# Patient Record
Sex: Female | Born: 1935 | Race: White | Hispanic: No | Marital: Married | Smoking: Former smoker
Health system: Southern US, Community
[De-identification: ages and names within clinical notes are randomized; demographics above are authoritative.]

## PROBLEM LIST (undated history)

## (undated) DIAGNOSIS — R0602 Shortness of breath: Secondary | ICD-10-CM

## (undated) DIAGNOSIS — T4145XA Adverse effect of unspecified anesthetic, initial encounter: Secondary | ICD-10-CM

## (undated) DIAGNOSIS — R112 Nausea with vomiting, unspecified: Secondary | ICD-10-CM

## (undated) DIAGNOSIS — Z9889 Other specified postprocedural states: Secondary | ICD-10-CM

## (undated) DIAGNOSIS — L989 Disorder of the skin and subcutaneous tissue, unspecified: Secondary | ICD-10-CM

## (undated) DIAGNOSIS — G479 Sleep disorder, unspecified: Secondary | ICD-10-CM

## (undated) DIAGNOSIS — E785 Hyperlipidemia, unspecified: Secondary | ICD-10-CM

## (undated) DIAGNOSIS — K219 Gastro-esophageal reflux disease without esophagitis: Secondary | ICD-10-CM

## (undated) DIAGNOSIS — M1990 Unspecified osteoarthritis, unspecified site: Secondary | ICD-10-CM

## (undated) DIAGNOSIS — I10 Essential (primary) hypertension: Secondary | ICD-10-CM

## (undated) DIAGNOSIS — T8859XA Other complications of anesthesia, initial encounter: Secondary | ICD-10-CM

## (undated) DIAGNOSIS — E079 Disorder of thyroid, unspecified: Secondary | ICD-10-CM

## (undated) DIAGNOSIS — Z8619 Personal history of other infectious and parasitic diseases: Secondary | ICD-10-CM

## (undated) DIAGNOSIS — M8580 Other specified disorders of bone density and structure, unspecified site: Secondary | ICD-10-CM

## (undated) DIAGNOSIS — K5790 Diverticulosis of intestine, part unspecified, without perforation or abscess without bleeding: Secondary | ICD-10-CM

## (undated) HISTORY — PX: ABDOMINAL HYSTERECTOMY: SHX81

## (undated) HISTORY — PX: BACK SURGERY: SHX140

## (undated) HISTORY — PX: TOTAL HIP ARTHROPLASTY: SHX124

## (undated) HISTORY — DX: Unspecified osteoarthritis, unspecified site: M19.90

## (undated) HISTORY — PX: CATARACT EXTRACTION: SUR2

## (undated) HISTORY — DX: Essential (primary) hypertension: I10

## (undated) HISTORY — PX: JOINT REPLACEMENT: SHX530

## (undated) HISTORY — PX: TONSILLECTOMY: SUR1361

## (undated) HISTORY — PX: LUMBAR LAMINECTOMY: SHX95

## (undated) HISTORY — DX: Hyperlipidemia, unspecified: E78.5

## (undated) HISTORY — DX: Disorder of thyroid, unspecified: E07.9

---

## 1981-10-15 HISTORY — PX: COLON SURGERY: SHX602

## 2004-03-30 ENCOUNTER — Encounter: Admission: RE | Admit: 2004-03-30 | Discharge: 2004-03-30 | Payer: Self-pay | Admitting: Family Medicine

## 2004-11-06 ENCOUNTER — Encounter: Admission: RE | Admit: 2004-11-06 | Discharge: 2004-11-06 | Payer: Self-pay | Admitting: Orthopedic Surgery

## 2005-03-19 ENCOUNTER — Encounter: Admission: RE | Admit: 2005-03-19 | Discharge: 2005-03-19 | Payer: Self-pay | Admitting: Orthopedic Surgery

## 2005-05-26 ENCOUNTER — Emergency Department (HOSPITAL_COMMUNITY): Admission: EM | Admit: 2005-05-26 | Discharge: 2005-05-26 | Payer: Self-pay | Admitting: Emergency Medicine

## 2005-05-31 ENCOUNTER — Encounter: Admission: RE | Admit: 2005-05-31 | Discharge: 2005-05-31 | Payer: Self-pay | Admitting: Family Medicine

## 2006-06-11 ENCOUNTER — Encounter: Admission: RE | Admit: 2006-06-11 | Discharge: 2006-06-11 | Payer: Self-pay | Admitting: Family Medicine

## 2006-09-24 ENCOUNTER — Inpatient Hospital Stay (HOSPITAL_COMMUNITY): Admission: RE | Admit: 2006-09-24 | Discharge: 2006-09-27 | Payer: Self-pay | Admitting: Orthopedic Surgery

## 2007-07-01 ENCOUNTER — Encounter: Admission: RE | Admit: 2007-07-01 | Discharge: 2007-07-01 | Payer: Self-pay | Admitting: Family Medicine

## 2008-01-19 ENCOUNTER — Ambulatory Visit: Payer: Self-pay | Admitting: Gastroenterology

## 2008-02-02 ENCOUNTER — Ambulatory Visit: Payer: Self-pay | Admitting: Gastroenterology

## 2008-08-10 ENCOUNTER — Encounter: Admission: RE | Admit: 2008-08-10 | Discharge: 2008-08-10 | Payer: Self-pay | Admitting: Family Medicine

## 2008-08-10 ENCOUNTER — Encounter: Payer: Self-pay | Admitting: Family Medicine

## 2009-02-02 ENCOUNTER — Encounter: Payer: Self-pay | Admitting: Family Medicine

## 2009-03-04 DIAGNOSIS — E785 Hyperlipidemia, unspecified: Secondary | ICD-10-CM | POA: Insufficient documentation

## 2009-03-04 DIAGNOSIS — I10 Essential (primary) hypertension: Secondary | ICD-10-CM | POA: Insufficient documentation

## 2009-03-10 ENCOUNTER — Ambulatory Visit: Payer: Self-pay | Admitting: Family Medicine

## 2009-03-10 DIAGNOSIS — M1990 Unspecified osteoarthritis, unspecified site: Secondary | ICD-10-CM | POA: Insufficient documentation

## 2009-03-10 DIAGNOSIS — E039 Hypothyroidism, unspecified: Secondary | ICD-10-CM | POA: Insufficient documentation

## 2009-03-11 LAB — CONVERTED CEMR LAB
CO2: 29 meq/L (ref 19–32)
Calcium: 9 mg/dL (ref 8.4–10.5)
Chloride: 102 meq/L (ref 96–112)
Creatinine, Ser: 0.6 mg/dL (ref 0.4–1.2)
Glucose, Bld: 87 mg/dL (ref 70–99)
Sodium: 135 meq/L (ref 135–145)

## 2009-06-01 ENCOUNTER — Ambulatory Visit: Payer: Self-pay | Admitting: Family Medicine

## 2009-09-12 ENCOUNTER — Ambulatory Visit: Payer: Self-pay | Admitting: Family Medicine

## 2009-09-12 DIAGNOSIS — L309 Dermatitis, unspecified: Secondary | ICD-10-CM | POA: Insufficient documentation

## 2009-09-12 DIAGNOSIS — L259 Unspecified contact dermatitis, unspecified cause: Secondary | ICD-10-CM

## 2009-11-08 ENCOUNTER — Encounter: Admission: RE | Admit: 2009-11-08 | Discharge: 2009-11-08 | Payer: Self-pay | Admitting: Family Medicine

## 2010-01-20 ENCOUNTER — Telehealth: Payer: Self-pay | Admitting: Family Medicine

## 2010-01-20 ENCOUNTER — Emergency Department (HOSPITAL_COMMUNITY): Admission: EM | Admit: 2010-01-20 | Discharge: 2010-01-21 | Payer: Self-pay | Admitting: Emergency Medicine

## 2010-01-20 ENCOUNTER — Emergency Department (HOSPITAL_COMMUNITY): Admission: EM | Admit: 2010-01-20 | Discharge: 2010-01-20 | Payer: Self-pay | Admitting: Family Medicine

## 2010-01-22 ENCOUNTER — Emergency Department (HOSPITAL_COMMUNITY): Admission: EM | Admit: 2010-01-22 | Discharge: 2010-01-23 | Payer: Self-pay | Admitting: Emergency Medicine

## 2010-01-22 ENCOUNTER — Emergency Department (HOSPITAL_COMMUNITY): Admission: EM | Admit: 2010-01-22 | Discharge: 2010-01-22 | Payer: Self-pay | Admitting: Family Medicine

## 2010-01-23 ENCOUNTER — Telehealth: Payer: Self-pay | Admitting: Family Medicine

## 2010-01-23 ENCOUNTER — Ambulatory Visit: Payer: Self-pay | Admitting: Family Medicine

## 2010-01-23 DIAGNOSIS — B029 Zoster without complications: Secondary | ICD-10-CM | POA: Insufficient documentation

## 2010-01-25 ENCOUNTER — Telehealth: Payer: Self-pay | Admitting: Family Medicine

## 2010-01-26 ENCOUNTER — Telehealth: Payer: Self-pay | Admitting: Family Medicine

## 2010-01-27 ENCOUNTER — Telehealth: Payer: Self-pay | Admitting: Family Medicine

## 2010-01-30 ENCOUNTER — Telehealth: Payer: Self-pay | Admitting: Family Medicine

## 2010-02-01 ENCOUNTER — Telehealth: Payer: Self-pay | Admitting: Family Medicine

## 2010-02-06 ENCOUNTER — Telehealth: Payer: Self-pay | Admitting: Family Medicine

## 2010-02-06 ENCOUNTER — Ambulatory Visit: Payer: Self-pay | Admitting: Family Medicine

## 2010-02-09 ENCOUNTER — Telehealth: Payer: Self-pay | Admitting: Family Medicine

## 2010-02-13 ENCOUNTER — Telehealth: Payer: Self-pay | Admitting: Family Medicine

## 2010-02-15 ENCOUNTER — Telehealth: Payer: Self-pay | Admitting: Family Medicine

## 2010-02-17 ENCOUNTER — Ambulatory Visit: Payer: Self-pay | Admitting: Family Medicine

## 2010-02-17 DIAGNOSIS — B0229 Other postherpetic nervous system involvement: Secondary | ICD-10-CM | POA: Insufficient documentation

## 2010-02-17 HISTORY — DX: Other postherpetic nervous system involvement: B02.29

## 2010-02-24 ENCOUNTER — Ambulatory Visit: Payer: Self-pay | Admitting: Family Medicine

## 2010-02-27 ENCOUNTER — Telehealth: Payer: Self-pay | Admitting: Family Medicine

## 2010-03-02 ENCOUNTER — Telehealth: Payer: Self-pay | Admitting: Family Medicine

## 2010-03-06 ENCOUNTER — Ambulatory Visit: Payer: Self-pay | Admitting: Family Medicine

## 2010-03-07 LAB — CONVERTED CEMR LAB
CO2: 32 meq/L (ref 19–32)
Calcium: 9 mg/dL (ref 8.4–10.5)
Chloride: 96 meq/L (ref 96–112)
Glucose, Bld: 95 mg/dL (ref 70–99)
Sodium: 135 meq/L (ref 135–145)

## 2010-03-08 ENCOUNTER — Telehealth: Payer: Self-pay | Admitting: Family Medicine

## 2010-03-14 ENCOUNTER — Telehealth: Payer: Self-pay | Admitting: Family Medicine

## 2010-03-21 ENCOUNTER — Telehealth: Payer: Self-pay | Admitting: Family Medicine

## 2010-03-22 ENCOUNTER — Telehealth: Payer: Self-pay | Admitting: Family Medicine

## 2010-03-23 ENCOUNTER — Telehealth: Payer: Self-pay | Admitting: Family Medicine

## 2010-03-27 ENCOUNTER — Telehealth: Payer: Self-pay | Admitting: Family Medicine

## 2010-03-28 ENCOUNTER — Telehealth: Payer: Self-pay | Admitting: Family Medicine

## 2010-03-29 ENCOUNTER — Ambulatory Visit: Payer: Self-pay | Admitting: Family Medicine

## 2010-04-10 ENCOUNTER — Ambulatory Visit: Payer: Self-pay | Admitting: Family Medicine

## 2010-04-18 ENCOUNTER — Telehealth: Payer: Self-pay | Admitting: Family Medicine

## 2010-04-28 ENCOUNTER — Telehealth: Payer: Self-pay | Admitting: Family Medicine

## 2010-05-03 ENCOUNTER — Telehealth: Payer: Self-pay | Admitting: Family Medicine

## 2010-05-04 ENCOUNTER — Ambulatory Visit: Payer: Self-pay | Admitting: Family Medicine

## 2010-05-04 DIAGNOSIS — R5383 Other fatigue: Secondary | ICD-10-CM

## 2010-05-04 DIAGNOSIS — F82 Specific developmental disorder of motor function: Secondary | ICD-10-CM | POA: Insufficient documentation

## 2010-05-04 DIAGNOSIS — R5381 Other malaise: Secondary | ICD-10-CM

## 2010-05-06 ENCOUNTER — Encounter: Admission: RE | Admit: 2010-05-06 | Discharge: 2010-05-06 | Payer: Self-pay | Admitting: Family Medicine

## 2010-05-06 ENCOUNTER — Encounter: Payer: Self-pay | Admitting: Family Medicine

## 2010-05-08 ENCOUNTER — Ambulatory Visit: Payer: Self-pay | Admitting: Family Medicine

## 2010-05-22 ENCOUNTER — Ambulatory Visit: Payer: Self-pay | Admitting: Family Medicine

## 2010-05-25 ENCOUNTER — Encounter: Payer: Self-pay | Admitting: Family Medicine

## 2010-06-13 ENCOUNTER — Encounter: Payer: Self-pay | Admitting: Family Medicine

## 2010-06-28 ENCOUNTER — Ambulatory Visit: Payer: Self-pay | Admitting: Family Medicine

## 2010-09-26 ENCOUNTER — Ambulatory Visit: Payer: Self-pay | Admitting: Family Medicine

## 2010-09-26 DIAGNOSIS — J370 Chronic laryngitis: Secondary | ICD-10-CM

## 2010-10-23 ENCOUNTER — Ambulatory Visit
Admission: RE | Admit: 2010-10-23 | Discharge: 2010-10-23 | Payer: Self-pay | Source: Home / Self Care | Attending: Family Medicine | Admitting: Family Medicine

## 2010-10-26 ENCOUNTER — Encounter: Payer: Self-pay | Admitting: Family Medicine

## 2010-11-14 NOTE — Assessment & Plan Note (Signed)
Summary: 1 month fup//ccm   Vital Signs:  Patient profile:   77 year old female Weight:      169 pounds Temp:     98.0 degrees F oral BP sitting:   130 / 80  (left arm) Cuff size:   regular  Vitals Entered By: Kathrynn Speed CMA (May 08, 2010 10:15 AM) CC: One month fu, MRI scan done of head,src   History of Present Illness: Followup postherpetic neuralgia and some recent issues of fatigue and difficulty finding appropriate words. We had concern about possibility of stroke vs medication side effects. 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  She has tapered back her Nortriptyline to 50 mg at night and is trying to scale back tramadol and has reduced her gabapentin to 300 mg t.i.d. Pain is stable at 3-4/10.  Current Medications (verified): 1)  Synthroid 112 Mcg Tabs (Levothyroxine Sodium) .... Once Daily 2)  Hydrochlorothiazide 25 Mg Tabs (Hydrochlorothiazide) .... One Tab Once Daily 3)  Zyrtec Allergy 10 Mg Tabs (Cetirizine Hcl) .... As Needed 4)  Timoptic Ocudose 0.5 % Soln (Timolol Maleate) .... One Drop To Each Eye Daily 5)  Aclovate 0.05 % Crea (Alclometasone Dipropionate) .... As Directed 6)  Caltrate 600+d 600-400 Mg-Unit Tabs (Calcium Carbonate-Vitamin D) .... Once Daily 7)  Ocuvite Preservision  Tabs (Multiple Vitamins-Minerals) .... Two Times A Day 8)  Co Q-10 Vitamin E Fish Oil 60-90-25-200 Caps (Dha-Epa-Coenzyme Q10-Vitamin E) .... Once Daily 9)  Glucosamine-Chondroitin 1500-1200 Mg/7ml Liqd (Glucosamine-Chondroitin) .... Three Times A Day 10)  Claritin 10 Mg Tabs (Loratadine) .... As Needed 11)  Clobetasol Propionate 0.05 % Oint (Clobetasol Propionate) .... Apply As Directed Once Daily As Needed. 12)  Amlodipine Besylate 2.5 Mg Tabs (Amlodipine Besylate) .... One By Mouth At Bedtime 13)  Gabapentin 300 Mg Caps (Gabapentin) .Marland Kitchen.. 1 Tab One To  Three  Times A Day To 3 14)  Nortriptyline Hcl 50 Mg Caps (Nortriptyline Hcl) .... Two By Mouth At Bedtime 15)  Tramadol Hcl 50 Mg Tabs (Tramadol Hcl) .Marland Kitchen.. 1-2 By Mouth Q 6 Hours As Needed Pain 16)  Zostrix Arthritis Pain Relief 0.025 % Crea (Capsaicin) .... Apply To Affected Area Twice Daily As Needed 17)  Pepcid Ac Maximum Strength 20 Mg Tabs (Famotidine) .... As Needed  Allergies (verified): 1)  ! Advil (Ibuprofen) 2)  ! Penicillin V Potassium (Penicillin V Potassium) 3)  ! Codeine Sulfate (Codeine Sulfate) 4)  ! Cephalosporins  Past History:  Past Medical History: Last updated: 03/10/2009 Hypothyroidism Hyperlipidemia Hypertension Osteoarthritis  Physical Exam  General:  Well-developed,well-nourished,in no acute distress; alert,appropriate and cooperative throughout examination Mouth:  Oral mucosa and oropharynx without lesions or exudates.  Teeth in good repair. Neck:  No deformities, masses, or tenderness noted. Lungs:  Normal respiratory effort, chest expands symmetrically. Lungs are clear to auscultation, no crackles or wheezes. Heart:  Normal rate and regular rhythm. S1 and S2 normal without gallop, murmur, click, rub or other extra sounds.   Impression & Recommendations:  Problem # 1:  POSTHERPETIC NEURALGIA (ICD-053.19)  stable with tapering of medication. Suspect a lot of her symptoms recent related to overmedication but she required significant medication to control her symptoms. Will continue in one week tapering of the nortriptyline to 25 mg at night.  Orders: Prescription Created Electronically [id number])  Complete Medication List: 1)  Synthroid 112 Mcg Tabs (Levothyroxine sodium) .... Once daily 2)  Hydrochlorothiazide 25 Mg Tabs (Hydrochlorothiazide) .... One tab once daily 3)  Zyrtec Allergy 10 Mg Tabs (Cetirizine hcl) .... As needed 4)  Timoptic Ocudose 0.5 % Soln (Timolol maleate) .... One drop to each eye daily 5)  Aclovate 0.05 % Crea (Alclometasone  dipropionate) .... As directed 6)  Caltrate 600+d 600-400 Mg-unit Tabs (Calcium carbonate-vitamin d) .... Once daily 7)  Ocuvite Preservision Tabs (Multiple vitamins-minerals) .... Two times a day 8)  Co Q-10 Vitamin E Fish Oil 60-90-25-200 Caps (Dha-epa-coenzyme q10-vitamin e) .... Once daily 9)  Glucosamine-chondroitin 1500-1200 Mg/51ml Liqd (Glucosamine-chondroitin) .... Three times a day 10)  Claritin 10 Mg Tabs (Loratadine) .... As needed 11)  Clobetasol Propionate 0.05 % Oint (Clobetasol propionate) .... Apply as directed once daily as needed. 12)  Amlodipine Besylate 2.5 Mg Tabs (Amlodipine besylate) .... One by mouth at bedtime 13)  Gabapentin 300 Mg Caps (Gabapentin) .Marland Kitchen.. 1 tab one to  three times a day to 3 14)  Nortriptyline Hcl 25 Mg Caps (Nortriptyline hcl) .... One by mouth at bedtime 15)  Tramadol Hcl 50 Mg Tabs (Tramadol hcl) .Marland Kitchen.. 1-2 by mouth q 6 hours as needed pain 16)  Zostrix Arthritis Pain Relief 0.025 % Crea (Capsaicin) .... Apply to affected area twice daily as needed 17)  Pepcid Ac Maximum Strength 20 Mg Tabs (Famotidine) .... As needed  Patient Instructions: 1)  In one week try further tapering of nortriptyline to 25 mg at night 2)  Please schedule a follow-up appointment in 2 weeks.  Prescriptions: NORTRIPTYLINE HCL 25 MG CAPS (NORTRIPTYLINE HCL) one by mouth at bedtime  #30 x 1   Entered and Authorized by:   Evelena Peat MD   Signed by:   Evelena Peat MD on 05/08/2010   Method used:   Print then Give to Patient   RxID:   [id number]

## 2010-11-14 NOTE — Progress Notes (Signed)
Summary: pain  Phone Note Call from Patient   Caller: Patient Call For: Evelena Peat MD Summary of Call: Pt is still in extreme pain, and wants to know what to do?  Since decreasing the Hydrocodone, she can hardly function with the amount of pain she is having. [phone] Initial call taken by: Lynann Beaver CMA,  Feb 13, 2010 11:55 AM  Follow-up for Phone Call        Clarify her current dose of gabapentin.  If taking two times a day, increase to three times a day.  We can refill her Vicodin if needed. Follow-up by: Evelena Peat MD,  Feb 13, 2010 12:46 PM  Additional Follow-up for Phone Call Additional follow up Details #1::        Pt is taking gabapentin 3 daily.  Advised she can increase the Vicodin to a max of 2 q 4 hours, but try 2 q 6 hours and see if she gets any relief first. Refills called to pharmacy. Additional Follow-up by: Lynann Beaver CMA,  Feb 13, 2010 1:08 PM    Prescriptions: HYDROCODONE-ACETAMINOPHEN 5-325 MG TABS (HYDROCODONE-ACETAMINOPHEN) 1-2 by mouth q 6 hours as needed pain  #30 x 1   Entered by:   Lynann Beaver CMA   Authorized by:   Evelena Peat MD   Signed by:   Lynann Beaver CMA on 02/13/2010   Method used:   Telephoned to ...       [facility]  [place] [address] [facility]ail[facility]       [address]       [place], [place]  [address]       Ph: [phone] or [phone]       Fax: [phone]   RxID:   [id number]

## 2010-11-14 NOTE — Progress Notes (Signed)
Summary: BP concerns  Phone Note Call from Patient   Caller: Spouse Call For: Evelena Peat MD Reason for Call: Talk to Doctor Summary of Call: Pt's husband is calling to let Dr. Caryl Never know Carolyn Henderson's BP was running 194/87 yesterday, and today it is 166/87.  Please call after lunch. [phone] Initial call taken by: Lynann Beaver CMA,  January 27, 2010 8:05 AM  Follow-up for Phone Call        BPs noted.  Add Amlodipine 2.5 mg one by mouth once daily and pt has f/u in one week. Follow-up by: Evelena Peat MD,  January 27, 2010 8:20 AM    New/Updated Medications: AMLODIPINE BESYLATE 2.5 MG TABS (AMLODIPINE BESYLATE) one by mouth once daily Prescriptions: AMLODIPINE BESYLATE 2.5 MG TABS (AMLODIPINE BESYLATE) one by mouth once daily  #30 x 5   Entered and Authorized by:   Evelena Peat MD   Signed by:   Evelena Peat MD on 01/27/2010   Method used:   [facility]        [facility]  [place] [address] [facility]       [address]       [place], [place]  [address]       Ph: [phone] or [phone]       Fax: [phone]   RxID:   [id number]

## 2010-11-14 NOTE — Assessment & Plan Note (Signed)
Summary: 1 month rov/.njr   Vital Signs:  Patient profile:   77 year old female Weight:      166 pounds Temp:     97.6 degrees F oral BP sitting:   166 / 80  (left arm) Cuff size:   regular  Vitals Entered By: Sid Falcon LPN (June 28, 2010 11:48 AM)  History of Present Illness: Followup postherpetic neuralgia. Overall her pain continues to improve slowly. She rates her pain 3-4/10. She has reduced nortriptyline to 25 mg at night and gabapentin 300 mg 3 times daily. Infrequently taking tramadol. Not using any topicals. She is  off opioids altogether at this time. Pain is mostly left upper back region. She has questions regarding whether she should get shingles vaccine.  Allergies: 1)  ! Advil (Ibuprofen) 2)  ! Penicillin V Potassium (Penicillin V Potassium) 3)  ! Codeine Sulfate (Codeine Sulfate) 4)  ! Cephalosporins  Past History:  Past Medical History: Last updated: 03/10/2009 Hypothyroidism Hyperlipidemia Hypertension Osteoarthritis  Review of Systems  The patient denies anorexia, fever, weight loss, and chest pain.    Physical Exam  General:  Well-developed,well-nourished,in no acute distress; alert,appropriate and cooperative throughout examination Lungs:  Normal respiratory effort, chest expands symmetrically. Lungs are clear to auscultation, no crackles or wheezes. Heart:  Normal rate and regular rhythm. S1 and S2 normal without gallop, murmur, click, rub or other extra sounds. Skin:  no rashes and no suspicious lesions.     Impression & Recommendations:  Problem # 1:  POSTHERPETIC NEURALGIA (ICD-053.19) Assessment Improved continue tapering of medications as outlined. Routine follow up 3 months. Flu vaccine given. Discussed pros and cons of shingles vaccine. Explained that this is not contraindicated but also not necessarily recommended since she has had prior shingles  Complete Medication List: 1)  Synthroid 112 Mcg Tabs (Levothyroxine sodium) ....  Once daily 2)  Hydrochlorothiazide 25 Mg Tabs (Hydrochlorothiazide) .... One tab once daily 3)  Zyrtec Allergy 10 Mg Tabs (Cetirizine hcl) .... As needed 4)  Timoptic Ocudose 0.5 % Soln (Timolol maleate) .... One drop to each eye two times a day 5)  Aclovate 0.05 % Crea (Alclometasone dipropionate) .... As directed 6)  Caltrate 600+d 600-400 Mg-unit Tabs (Calcium carbonate-vitamin d) .... Once daily 7)  Ocuvite Preservision Tabs (Multiple vitamins-minerals) .... Two times a day 8)  Co Q-10 Vitamin E Fish Oil 60-90-25-200 Caps (Dha-epa-coenzyme q10-vitamin e) .... Once daily 9)  Glucosamine-chondroitin 1500-1200 Mg/21ml Liqd (Glucosamine-chondroitin) .... Three times a day 10)  Claritin 10 Mg Tabs (Loratadine) .... As needed 11)  Clobetasol Propionate 0.05 % Oint (Clobetasol propionate) .... Apply as directed once daily as needed. 12)  Amlodipine Besylate 2.5 Mg Tabs (Amlodipine besylate) .... One by mouth at bedtime 13)  Gabapentin 300 Mg Caps (Gabapentin) .Marland Kitchen.. 1 tab one to  three times a day to 3 14)  Nortriptyline Hcl 25 Mg Caps (Nortriptyline hcl) .... One by mouth at bedtime 15)  Tramadol Hcl 50 Mg Tabs (Tramadol hcl) .Marland Kitchen.. 1-2 by mouth q 6 hours as needed pain 16)  Zostrix Arthritis Pain Relief 0.025 % Crea (Capsaicin) .... Apply to affected area twice daily as needed 17)  Pepcid Ac Maximum Strength 20 Mg Tabs (Famotidine) .... As needed  Other Orders: Flu Vaccine 18yrs + MEDICARE PATIENTS (W0981) Administration Flu vaccine - MCR (X9147)  Patient Instructions: 1)  Reduce nortriptyline to 25 mg at night. 2)  If pain stable in one week try reducing gabapentin to twice daily. 3)  If pain stable  with above for one month, try discontinuing nortriptyline  4)  Please schedule a follow-up appointment in 3 months .     Flu Vaccine Consent Questions     Do you have a history of severe allergic reactions to this vaccine? no    Any prior history of allergic reactions to egg and/or gelatin?  no    Do you have a sensitivity to the preservative Thimersol? no    Do you have a past history of Guillan-Barre Syndrome? no    Do you currently have an acute febrile illness? no    Have you ever had a severe reaction to latex? no    Vaccine information given and explained to patient? yes    Are you currently pregnant? no    Lot Number:[id number]   Exp Date:04/14/2011   Site Given  Left Deltoid IMdflu

## 2010-11-14 NOTE — Progress Notes (Signed)
Summary: shingles are itching  Phone Note Call from Patient   Caller: Spouse Call For: Carolyn Peat MD Summary of Call: Pt's husband is calling to ask for RX for itching that pt is experiencing from shingles.  States it is so bad, it makes her nauseated. [phone] Initial call taken by: Lynann Beaver CMA,  January 30, 2010 9:32 AM  Follow-up for Phone Call        try zyrtec OTC one daily and let me know if that is not working. Follow-up by: Carolyn Peat MD,  January 30, 2010 9:44 AM  Additional Follow-up for Phone Call Additional follow up Details #1::        Pt is taking Claritin in the am and Zyrtec in the pm, and it is not helping. Additional Follow-up by: Lynann Beaver CMA,  January 30, 2010 9:47 AM    Additional Follow-up for Phone Call Additional follow up Details #2::    Try Atarax 25 mg by mouth q 8 hours as needed #30 with one refill. Follow-up by: Carolyn Peat MD,  January 30, 2010 1:22 PM  New/Updated Medications: HYDROXYZINE HCL 25 MG TABS (HYDROXYZINE HCL) one by mouth q 8 hours as needed itching Prescriptions: HYDROXYZINE HCL 25 MG TABS (HYDROXYZINE HCL) one by mouth q 8 hours as needed itching  #30 x 1   Entered by:   Lynann Beaver CMA   Authorized by:   Carolyn Peat MD   Signed by:   Lynann Beaver CMA on 01/30/2010   Method used:   [facility]        [facility]  [place] [address]       [address] [place] [address]       [place], [place]  [address]       Ph: [phone] or [phone]       Fax: [phone]   RxID:   [id number]  Pt. notified.  Will d/c Claritin and Zyrtec while using Atarax.

## 2010-11-14 NOTE — Progress Notes (Signed)
Summary: hydrocodone refill  Phone Note Call from Patient   Caller: Patient Call For: Evelena Peat MD Summary of Call: Pt needs [facility]ills on [facility]  She will be out on Saturday. Initial call taken by: Lynann Beaver CMA,  March 23, 2010 4:25 PM  Follow-up for Phone Call        She has been on Oxycodone.  Will refill. Follow-up by: Evelena Peat MD,  March 23, 2010 6:00 PM  Additional Follow-up for Phone Call Additional follow up Details #1::        Husband informed ready for pick-up.  Pt taking one in the AM for shower, one in the PM for sleep, most pain is 3pm to bedtime Additional Follow-up by: Sid Falcon LPN,  March 24, 2010 8:49 AM    Prescriptions: PERCOCET 7.5-500 MG TABS (OXYCODONE-ACETAMINOPHEN) one by mouth q 6 hours as needed severe pain  #20 x 0   Entered and Authorized by:   Evelena Peat MD   Signed by:   Evelena Peat MD on 03/23/2010   Method used:   Print then Give to Patient   RxID:   [id number]

## 2010-11-14 NOTE — Progress Notes (Signed)
Summary: valacyclovir rx       New/Updated Medications: VALACYCLOVIR HCL 1 GM TABS (VALACYCLOVIR HCL) take one tab by mouth three times a day Prescriptions: VALACYCLOVIR HCL 1 GM TABS (VALACYCLOVIR HCL) take one tab by mouth three times a day  #90 x 0   Entered by:   Kern Reap CMA (AAMA)   Authorized by:   Evelena Peat MD   Signed by:   Kern Reap CMA (AAMA) on 04/28/2010   Method used:   [facility]        [facility]  [place] [address] [facility]       [address]       [place], [place]  [address]       Ph: [phone] or [phone]       Fax: [phone]   RxID:   [id number]

## 2010-11-14 NOTE — Progress Notes (Signed)
Summary: need refill  Phone Note Call from Patient Call back at Home Phone [phone]   Caller: Patient Call For: Evelena Peat MD Summary of Call: pt was given 30 pills on 02-02-2010 pt is out of med needs refill. Initial call taken by: Heron Sabins,  February 09, 2010 4:35 PM  Follow-up for Phone Call        pt should hopefully be able to back down on this soon as we titrate her neurontin.  Refilled #30 tabs by nurse today. Follow-up by: Evelena Peat MD,  February 09, 2010 5:50 PM

## 2010-11-14 NOTE — Assessment & Plan Note (Signed)
Summary: 6 month fup//ccm   Vital Signs:  Patient profile:   77 year old female Weight:      169 pounds Temp:     97.7 degrees F oral BP sitting:   130 / 82  (left arm) Cuff size:   regular  Vitals Entered By: Sid Falcon LPN (Mar 06, 2010 8:26 AM) CC: follow-up visit, Hypertension Management   History of Present Illness: pt here for f/u multiple medcial problems:  hypothyroid.  Compliant with meds.  Needs labs.  Difficulty losing weight but no overt symptoms of hypothyroid otherwise.  hypertension.  Meds reviewed.  No side effects.  Postherpetic neuralgia.  Pain improving and now 3/10 at baseline.  On Neurontin and Norttiptyline.  Tapering off Oxycodone.  Some dry mouth from meds o/w tolerating.  Hypertension History:      She denies headache, chest pain, palpitations, dyspnea with exertion, orthopnea, PND, visual symptoms, and neurologic problems.  Further comments include: mild peripheral edema.        Positive major cardiovascular risk factors include female age 83 years old or older, hyperlipidemia, and hypertension.  Negative major cardiovascular risk factors include non-tobacco-user status.     Allergies: 1)  ! Advil (Ibuprofen) 2)  ! Penicillin V Potassium (Penicillin V Potassium) 3)  ! Codeine Sulfate (Codeine Sulfate) 4)  ! Cephalosporins  Past History:  Past Medical History: Last updated: 03/10/2009 Hypothyroidism Hyperlipidemia Hypertension Osteoarthritis  Past Surgical History: Last updated: 03/10/2009 Cataract extraction Total hip replacement right 1999 left 2007 Hysterectomy, total vaginal Lumbar laminectomy SBO  Family History: Last updated: 03/10/2009 Family History Hypertension Family History of Prostate CA   Social History: Last updated: 03/10/2009 Retired Married Former Smoker Alcohol use-no  Risk Factors: Smoking Status: quit (02/06/2010) PMH-FH-SH reviewed for relevance  Review of Systems  The patient denies anorexia,  fever, weight loss, weight gain, chest pain, syncope, dyspnea on exertion, prolonged cough, headaches, abdominal pain, and muscle weakness.    Physical Exam  General:  Well-developed,well-nourished,in no acute distress; alert,appropriate and cooperative throughout examination Head:  Normocephalic and atraumatic without obvious abnormalities. No apparent alopecia or balding. Ears:  External ear exam shows no significant lesions or deformities.  Otoscopic examination reveals clear canals, tympanic membranes are intact bilaterally without bulging, retraction, inflammation or discharge. Hearing is grossly normal bilaterally. Mouth:  Oral mucosa and oropharynx without lesions or exudates.  Teeth in good repair. Neck:  No deformities, masses, or tenderness noted. Lungs:  Normal respiratory effort, chest expands symmetrically. Lungs are clear to auscultation, no crackles or wheezes. Heart:  normal rate, regular rhythm, and no gallop.   Extremities:  trace nonpitting edeama bil. Neurologic:  alert & oriented X3, cranial nerves II-XII intact, and strength normal in all extremities.     Impression & Recommendations:  Problem # 1:  POSTHERPETIC NEURALGIA (ICD-053.19) Assessment Improved discussed tapering of meds.  Taper off oxycodone this week.  Will then slowly taper off gabapentin  Problem # 2:  UNSPECIFIED HYPOTHYROIDISM (ICD-244.9)  Her updated medication list for this problem includes:    Synthroid 112 Mcg Tabs (Levothyroxine sodium) ..... Once daily  Orders: Prescription Created Electronically [phone]) Venipuncture [phone]) TLB-TSH (Thyroid Stimulating Hormone) (84443-TSH)  Problem # 3:  HYPERTENSION (ICD-401.9) Assessment: Unchanged refilled meds. Her updated medication list for this problem includes:    Hydrochlorothiazide 25 Mg Tabs (Hydrochlorothiazide) ..... One tab once daily    Amlodipine Besylate 2.5 Mg Tabs (Amlodipine besylate) ..... One by mouth at  bedtime  Orders: Prescription Created Electronically [phone]) Venipuncture (  [id number]) TLB-BMP (Basic Metabolic Panel-BMET) (80048-METABOL)  Complete Medication List: 1)  Synthroid 112 Mcg Tabs (Levothyroxine sodium) .... Once daily 2)  Hydrochlorothiazide 25 Mg Tabs (Hydrochlorothiazide) .... One tab once daily 3)  Zyrtec Allergy 10 Mg Tabs (Cetirizine hcl) .... As needed 4)  Timoptic Ocudose 0.5 % Soln (Timolol maleate) .... One drop to each eye daily 5)  Clindamycin Hcl 150 Mg Caps (Clindamycin hcl) .... As directed 6)  Aclovate 0.05 % Crea (Alclometasone dipropionate) .... As directed 7)  Caltrate 600+d 600-400 Mg-unit Tabs (Calcium carbonate-vitamin d) .... Once daily 8)  Ocuvite Preservision Tabs (Multiple vitamins-minerals) .... Two times a day 9)  Co Q-10 Vitamin E Fish Oil 60-90-25-200 Caps (Dha-epa-coenzyme q10-vitamin e) .... Once daily 10)  Glucosamine-chondroitin 1500-1200 Mg/65ml Liqd (Glucosamine-chondroitin) .... Three times a day 11)  Claritin 10 Mg Tabs (Loratadine) .... As needed 12)  Clobetasol Propionate 0.05 % Oint (Clobetasol propionate) .... Apply as directed once daily as needed. 13)  Percocet 7.5-500 Mg Tabs (Oxycodone-acetaminophen) .... One by mouth q 6 hours as needed severe pain 14)  Amlodipine Besylate 2.5 Mg Tabs (Amlodipine besylate) .... One by mouth at bedtime 15)  Gabapentin 300 Mg Caps (Gabapentin) .... Use as directed. 16)  Nortriptyline Hcl 50 Mg Caps (Nortriptyline hcl) .... One by mouth at bedtime  Hypertension Assessment/Plan:      The patient's hypertensive risk group is category B: At least one risk factor (excluding diabetes) with no target organ damage.  Today's blood pressure is 130/82.    Patient Instructions: 1)  Please schedule a follow-up appointment in 1 month.  Prescriptions: AMLODIPINE BESYLATE 2.5 MG TABS (AMLODIPINE BESYLATE) one by mouth at bedtime  #90 x 3   Entered and Authorized by:   Evelena Peat MD   Signed by:   Evelena Peat MD on 03/06/2010   Method used:   [facility]        [facility]  [place] [address]       [address]       [place], [place]  [address]       Ph: [phone] or [phone]       Fax: [phone]   RxID:   [id number] HYDROCHLOROTHIAZIDE 25 MG TABS (HYDROCHLOROTHIAZIDE) one tab once daily  #90 x 3   Entered and Authorized by:   Evelena Peat MD   Signed by:   Evelena Peat MD on 03/06/2010   Method used:   [facility]        [facility]  [place] [address] [facility]       [address]       [place], [place]  [address]       Ph: [phone] or [phone]       Fax: [phone]   RxID:   [id number] SYNTHROID 112 MCG TABS (LEVOTHYROXINE SODIUM) once daily  #90 x 3   Entered and Authorized by:   Evelena Peat MD   Signed by:   Evelena Peat MD on 03/06/2010   Method used:   [facility]        [facility]  [place] [address] [facility]       [address]       [place], [place]  [address]       Ph: [phone] or [phone]       Fax: [phone]   RxID:   [id number]

## 2010-11-14 NOTE — Assessment & Plan Note (Signed)
Summary: discuss pain control/dm   Vital Signs:  Patient profile:   77 year old female Weight:      172 pounds Temp:     98.2 degrees F oral Cuff size:   regular  Vitals Entered By: Kathrynn Speed CMA (March 29, 2010 3:42 PM) CC: Pian control from shingles pain   History of Present Illness: Followup postherpetic neuralgia. Pain continues severe at times up to 9/10. Burning quality. Mostly left anterior chest radiating posteriorly. Patient currently taking 5 tablets daily of gabapentin 2 in the morning one around noon and 2 at night and nortriptyline 50 mg 2 at night. Also has been using Percocet 7.5 g every 6 hours with minimal relief. Difficulty sleeping at night occasionally.  Pain is worse 3pm to 10 pm. Rash has cleared completely.  We had discussed possible pain management center but is not sure she wishes to proceed in that direction.  Current Medications (verified): 1)  Synthroid 112 Mcg Tabs (Levothyroxine Sodium) .... Once Daily 2)  Hydrochlorothiazide 25 Mg Tabs (Hydrochlorothiazide) .... One Tab Once Daily 3)  Zyrtec Allergy 10 Mg Tabs (Cetirizine Hcl) .... As Needed 4)  Timoptic Ocudose 0.5 % Soln (Timolol Maleate) .... One Drop To Each Eye Daily 5)  Clindamycin Hcl 150 Mg Caps (Clindamycin Hcl) .... As Directed 6)  Aclovate 0.05 % Crea (Alclometasone Dipropionate) .... As Directed 7)  Caltrate 600+d 600-400 Mg-Unit Tabs (Calcium Carbonate-Vitamin D) .... Once Daily 8)  Ocuvite Preservision  Tabs (Multiple Vitamins-Minerals) .... Two Times A Day 9)  Co Q-10 Vitamin E Fish Oil 60-90-25-200 Caps (Dha-Epa-Coenzyme Q10-Vitamin E) .... Once Daily 10)  Glucosamine-Chondroitin 1500-1200 Mg/78ml Liqd (Glucosamine-Chondroitin) .... Three Times A Day 11)  Claritin 10 Mg Tabs (Loratadine) .... As Needed 12)  Clobetasol Propionate 0.05 % Oint (Clobetasol Propionate) .... Apply As Directed Once Daily As Needed. 13)  Percocet 7.5-500 Mg Tabs (Oxycodone-Acetaminophen) .... One By Mouth Q  6 Hours As Needed Severe Pain 14)  Amlodipine Besylate 2.5 Mg Tabs (Amlodipine Besylate) .... One By Mouth At Bedtime 15)  Gabapentin 300 Mg Caps (Gabapentin) .... Use As Directed. 16)  Nortriptyline Hcl 50 Mg Caps (Nortriptyline Hcl) .... Two By Mouth At Bedtime  Allergies (verified): 1)  ! Advil (Ibuprofen) 2)  ! Penicillin V Potassium (Penicillin V Potassium) 3)  ! Codeine Sulfate (Codeine Sulfate) 4)  ! Cephalosporins  Past History:  Past Medical History: Last updated: 03/10/2009 Hypothyroidism Hyperlipidemia Hypertension Osteoarthritis  Past Surgical History: Last updated: 03/10/2009 Cataract extraction Total hip replacement right 1999 left 2007 Hysterectomy, total vaginal Lumbar laminectomy SBO  Social History: Last updated: 03/10/2009 Retired Married Former Smoker Alcohol use-no PMH reviewed for relevance, SH/Risk Factors reviewed for relevance  Review of Systems  The patient denies anorexia, fever, weight loss, chest pain, dyspnea on exertion, peripheral edema, prolonged cough, headaches, and abdominal pain.    Physical Exam  General:  Well-developed,well-nourished,in no acute distress; alert,appropriate and cooperative throughout examination Head:  Normocephalic and atraumatic without obvious abnormalities. No apparent alopecia or balding. Mouth:  Oral mucosa and oropharynx without lesions or exudates.  Teeth in good repair. Neck:  No deformities, masses, or tenderness noted. Lungs:  Normal respiratory effort, chest expands symmetrically. Lungs are clear to auscultation, no crackles or wheezes. Heart:  normal rate and regular rhythm.   Psych:  normally interactive, good eye contact, not anxious appearing, and not depressed appearing.     Impression & Recommendations:  Problem # 1:  POSTHERPETIC NEURALGIA (ICD-053.19) Assessment Deteriorated  add Lidoderm patch  and patient will consider topical Zostrix cream. Discontinue Percocet which does not seem to  be helping and try Ultram. Follow up in 2 weeks.  Orders: Prescription Created Electronically [phone])  Complete Medication List: 1)  Synthroid 112 Mcg Tabs (Levothyroxine sodium) .... Once daily 2)  Hydrochlorothiazide 25 Mg Tabs (Hydrochlorothiazide) .... One tab once daily 3)  Zyrtec Allergy 10 Mg Tabs (Cetirizine hcl) .... As needed 4)  Timoptic Ocudose 0.5 % Soln (Timolol maleate) .... One drop to each eye daily 5)  Clindamycin Hcl 150 Mg Caps (Clindamycin hcl) .... As directed 6)  Aclovate 0.05 % Crea (Alclometasone dipropionate) .... As directed 7)  Caltrate 600+d 600-400 Mg-unit Tabs (Calcium carbonate-vitamin d) .... Once daily 8)  Ocuvite Preservision Tabs (Multiple vitamins-minerals) .... Two times a day 9)  Co Q-10 Vitamin E Fish Oil 60-90-25-200 Caps (Dha-epa-coenzyme q10-vitamin e) .... Once daily 10)  Glucosamine-chondroitin 1500-1200 Mg/62ml Liqd (Glucosamine-chondroitin) .... Three times a day 11)  Claritin 10 Mg Tabs (Loratadine) .... As needed 12)  Clobetasol Propionate 0.05 % Oint (Clobetasol propionate) .... Apply as directed once daily as needed. 13)  Percocet 7.5-500 Mg Tabs (Oxycodone-acetaminophen) .... One by mouth q 6 hours as needed severe pain 14)  Amlodipine Besylate 2.5 Mg Tabs (Amlodipine besylate) .... One by mouth at bedtime 15)  Gabapentin 300 Mg Caps (Gabapentin) .... Use as directed. 16)  Nortriptyline Hcl 50 Mg Caps (Nortriptyline hcl) .... Two by mouth at bedtime 17)  Tramadol Hcl 50 Mg Tabs (Tramadol hcl) .Marland Kitchen.. 1-2 by mouth q 6 hours as needed pain 18)  Lidoderm 5 % Ptch (Lidocaine) .... Apply patch to affected area 12 hours/day as directed.  Patient Instructions: 1)  Please schedule a follow-up appointment in 2 weeks.  2)  discontinue Percocet if this does not seem to be helping. 3)  Consider use of over-the-counter Zostrix cream 3 times daily Prescriptions: LIDODERM 5 % PTCH (LIDOCAINE) apply patch to affected area 12 hours/day as directed.  #30 x  1   Entered and Authorized by:   Evelena Peat MD   Signed by:   Evelena Peat MD on 03/29/2010   Method used:   [facility]        [facility]  [place] [address] [facility]       [address]       [place], [place]  [address]       Ph: [phone] or [phone]       Fax: [phone]   RxID:   [id number] TRAMADOL HCL 50 MG TABS (TRAMADOL HCL) 1-2 by mouth q 6 hours as needed pain  #120 x 1   Entered and Authorized by:   Evelena Peat MD   Signed by:   Evelena Peat MD on 03/29/2010   Method used:   [facility]        [facility]  [place] [address]       [address]       [place], [place]  [address]       Ph: [phone] or [phone]       Fax: [phone]   RxID:   [id number]

## 2010-11-14 NOTE — Progress Notes (Signed)
Summary: Call-A-Nurse Report    Call-A-Nurse Triage Call Report Triage Record Num: [id number] Operator: Estevan Oaks Patient Name: Carolyn Henderson Call Date & Time: 01/25/2010 6:06:53PM Patient Phone: PCP: Evelena Peat Patient Gender: Female PCP Fax : [phone] Patient DOB: Aug 28, 1930 [facility] Name[facility] Reason for Call: Spouse, Melvyn Neth is calling because she still has ^'d Bp. Sts they called office this afternoon and was told that Dr Caryl Never would call back. BP 181/111 1430. Last at 1730 191/97. Dx's with Shingles at [facility] and ED on 4/8; was sent to ED due to HTN. Slight nausea and mild lightheadedness. Valcyclovir and hydrocodone for Shingles. Last OV 4/11 for f/u to ED visit and sts BP was "fine" at office. Only thing she takes for HTN HCTZ 12.5 mg qd for the past 6 yrs. [facility] consulted. T.O. Take another 12.5 mg HCTZ and recheck BP in 60-90 mins. If not coming down, to Ed. Pt voices understanding. Protocol(s) Used: Hypertension; Known / Suspected Recommended Outcome per Protocol: See Provider within 4 hours Override Outcome if Used in Protocol: Call Provider Immediately RN Reason for Override Outcome: Nursing Judgement Used. Reason for Outcome: Systolic BP of >180 mmHg OR diastolic BP of >120 mmHg Care Advice:  ~ 01/25/2010 6:28:49PM Page 1 of 1 [email or web address]_[email or web address]  Spoke with pt last night and today.  Her BP this AM is 136 systolic and she feels well.  Will continue full tablet of HCTZ and monitor BP.  We will consider addition of low dose amlodipine if BP remains up. Evelena Peat MD  January 26, 2010 8:37 AM

## 2010-11-14 NOTE — Progress Notes (Signed)
Summary: pain meds  Phone Note Call from Patient   Caller: Patient Call For: Evelena Peat MD Summary of Call: Pt cannot control her pain with one Oxycodone daily, and wants to know what she can do????? [phone] Initial call taken by: [facility] CMA,  March 21, 2010 4:07 PM  Follow-up for Phone Call        try one two times a day.  Pt should schedule f/u to reassess within 1-2 weeks if not already scheduled. Follow-up by: Evelena Peat MD,  March 21, 2010 9:15 PM     Appended Document: pain meds Pt notified.

## 2010-11-14 NOTE — Progress Notes (Signed)
Summary: elevated BP  Phone Note Call from Patient   Caller: Patient Call For: Evelena Peat MD Summary of Call: Pt is calling c/o back pain and BPs as high as 200/106.  Pt will go to [facility] for evaluation. Initial call taken by: Lynann Beaver CMA,  January 20, 2010 4:58 PM  Follow-up for Phone Call        noted. Follow-up by: Evelena Peat MD,  January 20, 2010 5:19 PM

## 2010-11-14 NOTE — Miscellaneous (Signed)
Summary: Physical Therapy Initial Evaluation/[facility]  Physical Therapy Initial Evaluation/[facility]   Imported By: Maryln Gottron 05/29/2010 10:03:51  _____________________________________________________________________  External Attachment:    Type:   Image     Comment:   External Document

## 2010-11-14 NOTE — Miscellaneous (Signed)
Summary: Progress Report/[facility]  Progress Report/[facility]   Imported By: Maryln Gottron 06/16/2010 10:45:57  _____________________________________________________________________  External Attachment:    Type:   Image     Comment:   External Document

## 2010-11-14 NOTE — Progress Notes (Signed)
Summary: fearful of horrific pain  Phone Note Call from Patient Call back at Home Phone [phone]   Reason for Call: Refill Medication Summary of Call: Oxycodone 14 tabs left.  Will run out before Mon night - Holiday weekend.  For shingles.   Initial call taken by: Rudy Jew, RN,  Mar 08, 2010 1:06 PM  Follow-up for Phone Call        We have discussed tapering back.  She should not need any more if tapering as instructed. Follow-up by: Evelena Peat MD,  Mar 08, 2010 1:19 PM  Additional Follow-up for Phone Call Additional follow up Details #1::        Taper of 2 perday does not give her pain relief, so yesterday Tues went to 3 daily.  So far today only 1, but pain has her just about in tears.  Taking the Gabapentin 2-1-2 & Nortrip, last night took just 1, took 2 night before last.  Even if she can alternate 2 & 3 daily of the Oxycodone, will not have enough to make it until Mon late when she could actually get it.  What to do?  She's just so fearful of being in this horrific pain.    Additional Follow-up by: Rudy Jew, RN,  Mar 08, 2010 1:59 PM    Additional Follow-up for Phone Call Additional follow up Details #2::    call Friday and we can refill then if necessary. Follow-up by: Evelena Peat MD,  Mar 08, 2010 5:02 PM  Additional Follow-up for Phone Call Additional follow up Details #3:: Details for Additional Follow-up Action Taken: Patient says okay. Additional Follow-up by: Rudy Jew, RN,  Mar 08, 2010 5:05 PM

## 2010-11-14 NOTE — Progress Notes (Signed)
Summary: Hydrocodone refill request  Phone Note Call from Patient   Caller: Patient Call For: Evelena Peat MD Summary of Call: Pt suffering from shingles, saw Dr Caryl Never on 4/11 and was given Hydrocodone-Acetaminophen 5-325, may take one tab every 4-6 hours as needed, #40 disp.  Pt is requesting a refill as she is still very uncomfortable and is taking one tab every 4-6-hours.  She has 8 pills left and is concerned as she will run out on Friday 4/22.  Pt does have a F/U appt with Dr Caryl Never on Monday 4/25 Initial call taken by: Sid Falcon LPN,  February 01, 2010 4:14 PM  Follow-up for Phone Call        okay ............. dispense 30 tabs no refills Follow-up by: Roderick Pee MD,  February 02, 2010 8:13 AM    Prescriptions: HYDROCODONE-ACETAMINOPHEN 5-325 MG TABS (HYDROCODONE-ACETAMINOPHEN) 1-2 by mouth q 4-6 hours as needed pain  #30 x 0   Entered by:   Sid Falcon LPN   Authorized by:   Roderick Pee MD   Signed by:   Sid Falcon LPN on 16/07/9603   Method used:   Telephoned to ...       [facility]  [place] [address] ([facility]ail)       [address]       [place], [place]  [address]       Ph: [phone] or [phone]       Fax: [phone]   RxID:   [id number]

## 2010-11-14 NOTE — Progress Notes (Signed)
Summary: over medicated?  Phone Note Call from Patient   Caller: Patient Call For: Evelena Peat MD Summary of Call: Pt is checking in to let Dr Caryl Never know she has been a little dizzy last week, and almost fell.  Thinks she may have been over medicated. [facility] [phone]  Initial call taken by: Lynann Beaver CMA,  April 18, 2010 2:18 PM  Follow-up for Phone Call        Her pain has finally been better controlled.  Try reducing gabapentin to one by mouth three times a day. Follow-up by: Evelena Peat MD,  April 18, 2010 4:13 PM  Additional Follow-up for Phone Call Additional follow up Details #1::        Line busy.  Husband informed. Lynann Beaver [facility]  April 19, 2010 9:18 AM  Additional Follow-up by: Lynann Beaver CMA,  April 18, 2010 4:20 PM    New/Updated Medications: GABAPENTIN 300 MG CAPS (GABAPENTIN) one by mouth tid

## 2010-11-14 NOTE — Assessment & Plan Note (Signed)
Summary: 1 wk rov/njr   Vital Signs:  Patient profile:   77 year old female Temp:     98.3 degrees F oral BP sitting:   120 / 78  (left arm) Cuff size:   regular  Vitals Entered By: Sid Falcon LPN (Feb 24, 2010 1:27 PM) CC: 1 week follow-up   History of Present Illness: Follow up postherpetic neuralgia. Overall pain has greatly improved. Still has severe episodes 6/10 severity but at baseline usually about 3 of 10. Added nortriptyline last visit and tolerating well except for mild dry mouth. Also taking gabapentin 300 mg 2 in the morning, one at lunch, and 2 at night. Does have some mild sedation but overall tolerating well.  Tapered back oxycodone to about one per day for severe episodes and has had some mild constipation. Still feels need to have some on hand for severe symptoms.  Allergies: 1)  ! Advil (Ibuprofen) 2)  ! Penicillin V Potassium (Penicillin V Potassium) 3)  ! Codeine Sulfate (Codeine Sulfate) 4)  ! Cephalosporins  Physical Exam  General:  Well-developed,well-nourished,in no acute distress; alert,appropriate and cooperative throughout examination Lungs:  Normal respiratory effort, chest expands symmetrically. Lungs are clear to auscultation, no crackles or wheezes. Heart:  Normal rate and regular rhythm. S1 and S2 normal without gallop, murmur, click, rub or other extra sounds. Skin:  rash resolving left thoracic area   Impression & Recommendations:  Problem # 1:  POSTHERPETIC NEURALGIA (ICD-053.19) Assessment Improved  continue medications as above. Hopefully in a month or so we can start tapering back.  Orders: Prescription Created Electronically [phone])  Complete Medication List: 1)  Synthroid 112 Mcg Tabs (Levothyroxine sodium) .... Once daily 2)  Hydrochlorothiazide 25 Mg Tabs (Hydrochlorothiazide) .... One tab once daily 3)  Zyrtec Allergy 10 Mg Tabs (Cetirizine hcl) .... As needed 4)  Timoptic Ocudose 0.5 % Soln (Timolol maleate) .... One drop  to each eye daily 5)  Clindamycin Hcl 150 Mg Caps (Clindamycin hcl) .... As directed 6)  Aclovate 0.05 % Crea (Alclometasone dipropionate) .... As directed 7)  Caltrate 600+d 600-400 Mg-unit Tabs (Calcium carbonate-vitamin d) .... Once daily 8)  Ocuvite Preservision Tabs (Multiple vitamins-minerals) .... Two times a day 9)  Co Q-10 Vitamin E Fish Oil 60-90-25-200 Caps (Dha-epa-coenzyme q10-vitamin e) .... Once daily 10)  Glucosamine-chondroitin 1500-1200 Mg/67ml Liqd (Glucosamine-chondroitin) .... Three times a day 11)  Claritin 10 Mg Tabs (Loratadine) .... As needed 12)  Clobetasol Propionate 0.05 % Oint (Clobetasol propionate) .... Apply as directed once daily as needed. 13)  Percocet 7.5-500 Mg Tabs (Oxycodone-acetaminophen) .... One by mouth q 6 hours as needed severe pain 14)  Amlodipine Besylate 2.5 Mg Tabs (Amlodipine besylate) .... One by mouth at bedtime 15)  Gabapentin 300 Mg Caps (Gabapentin) .... Use as directed. 16)  Nortriptyline Hcl 50 Mg Caps (Nortriptyline hcl) .... One by mouth at bedtime  Patient Instructions: 1)  Touch base by phone in 3-4 weeks and if improving at that point we will start tapering back her medications. Prescriptions: PERCOCET 7.5-500 MG TABS (OXYCODONE-ACETAMINOPHEN) one by mouth q 6 hours as needed severe pain  #30 x 0   Entered and Authorized by:   Evelena Peat MD   Signed by:   Evelena Peat MD on 02/24/2010   Method used:   Print then Give to Patient   RxID:   [id number] GABAPENTIN 300 MG CAPS (GABAPENTIN) use as directed.  #150 x 3   Entered and Authorized by:   Evelena Peat MD  Signed by:   Evelena Peat MD on 02/24/2010   Method used:   [facility]        [facility]  [place] [address] [facility]       [address]       [place], [place]  [address]       Ph: [phone] or [phone]       Fax: [phone]   RxID:   [id number]

## 2010-11-14 NOTE — Assessment & Plan Note (Signed)
Summary: still c/o unbearable pain from shingles/dm   Vital Signs:  Patient profile:   77 year old female Weight:      169 pounds Temp:     98.4 degrees F BP sitting:   150 / 90  (left arm) Cuff size:   regular  Vitals Entered By: Sid Falcon LPN (Feb 17, 7845 12:09 PM) CC: ongoing shingles pain   History of Present Illness: Patient seen with severe postherpetic neuralgia. At baseline pain is 7/10 and she has acute attacks of pain which are 10 out of 10 and extremely severe. She is currently on Percocet 7.5 mg and gabapentin one in the morning one at lunch and 2 at night. Overall the gabapentin has helped slightly but still severe pain. Rash has healed.  Pain mostly under L breast region and to a lesser extent the back.  Allergies: 1)  ! Advil (Ibuprofen) 2)  ! Penicillin V Potassium (Penicillin V Potassium) 3)  ! Codeine Sulfate (Codeine Sulfate) 4)  ! Cephalosporins  Past History:  Past Medical History: Last updated: 03/10/2009 Hypothyroidism Hyperlipidemia Hypertension Osteoarthritis PMH reviewed for relevance  Review of Systems  The patient denies anorexia, fever, weight loss, chest pain, syncope, and dyspnea on exertion.    Physical Exam  General:  Well-developed,well-nourished,in no acute distress; alert,appropriate and cooperative throughout examination Lungs:  Normal respiratory effort, chest expands symmetrically. Lungs are clear to auscultation, no crackles or wheezes. Heart:  normal rate and regular rhythm.     Impression & Recommendations:  Problem # 1:  POSTHERPETIC NEURALGIA (ICD-053.19) Assessment Deteriorated Still has severe episodes of pain not controlled with Percocet and gabapentin.  Add Nortriptyline 50 mg at bedtime and increase gabapentin to 2 am one at lunch and 2 at bedtime.  Reassess one week.  Complete Medication List: 1)  Synthroid 112 Mcg Tabs (Levothyroxine sodium) .... Once daily 2)  Hydrochlorothiazide 25 Mg Tabs  (Hydrochlorothiazide) .... One tab once daily 3)  Zyrtec Allergy 10 Mg Tabs (Cetirizine hcl) .... As needed 4)  Timoptic Ocudose 0.5 % Soln (Timolol maleate) .... One drop to each eye daily 5)  Clindamycin Hcl 150 Mg Caps (Clindamycin hcl) .... As directed 6)  Aclovate 0.05 % Crea (Alclometasone dipropionate) .... As directed 7)  Caltrate 600+d 600-400 Mg-unit Tabs (Calcium carbonate-vitamin d) .... Once daily 8)  Ocuvite Preservision Tabs (Multiple vitamins-minerals) .... Two times a day 9)  Co Q-10 Vitamin E Fish Oil 60-90-25-200 Caps (Dha-epa-coenzyme q10-vitamin e) .... Once daily 10)  Glucosamine-chondroitin 1500-1200 Mg/58ml Liqd (Glucosamine-chondroitin) .... Three times a day 11)  Claritin 10 Mg Tabs (Loratadine) .... As needed 12)  Clobetasol Propionate 0.05 % Oint (Clobetasol propionate) .... Apply as directed once daily as needed. 13)  Percocet 7.5-500 Mg Tabs (Oxycodone-acetaminophen) .... One by mouth q 6 hours as needed severe pain 14)  Amlodipine Besylate 2.5 Mg Tabs (Amlodipine besylate) .... One by mouth at bedtime 15)  Gabapentin 300 Mg Caps (Gabapentin) .... Use as directed. 16)  Nortriptyline Hcl 50 Mg Caps (Nortriptyline hcl) .... One by mouth at bedtime  Patient Instructions: 1)  schedule followup appointment in one week 2)  Start Nortriptyline one nightly 3)  Increase Gabapentin to 2 morning, one lunch, and 2 at bedtime. Prescriptions: NORTRIPTYLINE HCL 50 MG CAPS (NORTRIPTYLINE HCL) one by mouth at bedtime  #30 x 3   Entered and Authorized by:   Evelena Peat MD   Signed by:   Evelena Peat MD on 02/17/2010   Method used:   [facility]  to        [facility]  [place] [address] (retail)       [address]       [place], [place]  [address]       Ph: [phone] or [phone]       Fax: [phone]   RxID:   [id number]

## 2010-11-14 NOTE — Progress Notes (Signed)
  Phone Note Call from Patient   Caller: Spouse Call For: Evelena Peat MD Summary of Call: Pt states her pain has been a 10 plus all day Tuesday. [phone] Initial call taken by: Lynann Beaver CMA,  March 28, 2010 4:46 PM  Follow-up for Phone Call        Let's get her in to office on 03-29-10 to re-evaluate.  We have made referral to pain management center but have some additional things we can add until she gets in to see them. Follow-up by: Evelena Peat MD,  March 28, 2010 7:07 PM  Additional Follow-up for Phone Call Additional follow up Details #1::        Appt scheduled. Additional Follow-up by: Lynann Beaver CMA,  March 29, 2010 7:50 AM

## 2010-11-14 NOTE — Progress Notes (Signed)
  Phone Note Call from Patient   Caller: Patient Call For: Evelena Peat MD Summary of Call: Pt is calling with increased pain from the shingles, and meds are not helping a lot.  Is asking for Rx that is stronger.Marland Kitchen..Marland Kitchenworse pain she has ever had. [phone] Initial call taken by: Lynann Beaver CMA,  Feb 27, 2010 9:45 AM  Follow-up for Phone Call        pt had been doing better at f/u last week but had bad night sec to pain.  She will titrate her nortriptyline  to 100 mg tonight and cont other meds as prescribed. Follow-up by: Evelena Peat MD,  Feb 27, 2010 12:29 PM

## 2010-11-14 NOTE — Progress Notes (Signed)
Summary: Call-A-Nurse Report    Call-A-Nurse Triage Call Report Triage Record Num: [id number] Operator: Sheryn Bison Patient Name: Carolyn Henderson Call Date & Time: 01/22/2010 4:11:13PM Patient Phone: PCP: Evelena Peat Patient Gender: Female PCP Fax : [phone] Patient DOB: September 06, 1930 [facility] Name[facility] Reason for Call: Pt is calling concerning her BP. It is 187/95 now. Pt had a reading of 219/96 on Friday even and went to [facility]. From there they sent her to the hospital where she had IV's. By then the pt states she had broken out with a red rash that was dx as shingles. Pt was prescribed hydrocodone and Prednisone and sent home (4am last night). Pt has been monitoring her BP today and it has been steadily creeping up. Pt denies h/a but states that her face appears red/flushed. RN advised [facility] per protocol. Protocol(s) Used: Hypertension; Known / Suspected Recommended Outcome per Protocol: See Provider within 4 hours Reason for Outcome: Systolic BP of >180 mmHg OR diastolic BP of >120 mmHg Care Advice:  ~ 01/22/2010 4:22:47PM Page 1 of 1 [email or web address]_TriageRpt_[email or web address]

## 2010-11-14 NOTE — Progress Notes (Signed)
Summary: BP still up & dizzy, maybe from shingles med  Phone Note Call from Patient   Summary of Call: BP 181/111standing,sitting 163/96 & 180/95.  Has had dizziness & lightheadedness.  BP med is 1/2 tab HCT 25mg .   Have shingles med acyclovir that says may cause dizziness.  [facility].  Allergic to codeine, cephalosporins, penicillin.   Initial call taken by: Rudy Jew, RN,  January 25, 2010 2:53 PM  Follow-up for Phone Call        spoke with pt.  she will take extra 1/2 HCTZ tonight and call with BPs in AM. Follow-up by: Evelena Peat MD,  January 25, 2010 6:31 PM

## 2010-11-14 NOTE — Assessment & Plan Note (Signed)
Summary: 2 week fup//ccm   Vital Signs:  Patient profile:   77 year old female Height:      62.5 inches Weight:      169 pounds BMI:     30.53 Temp:     98.6 degrees F oral Pulse rate:   72 / minute Resp:     14 per minute BP sitting:   140 / 80  (left arm)  Vitals Entered By: Willy Eddy, LPN (February 06, 2010 10:04 AM) CC: shingles about 2 weeks ago and has completed valcyclovir- c/o post herpetic pain -taking vicodan 2 every 4 hours with some relief   History of Present Illness: Recent shingles. Rash healing well but has severe postherpetic neuralgia pain. Pain rated 10 out of 10 at times. Taking hydrocodone which helps. Over-the-counter medications have not helped. Pain confined to the region of shingles and is described as burning and stinging type of pain.  For the most part blood pressure controlled but has spiked occasionally with severe pain.  Preventive Screening-Counseling & Management  Alcohol-Tobacco     Smoking Status: quit  Current Medications (verified): 1)  Synthroid 112 Mcg Tabs (Levothyroxine Sodium) .... Once Daily 2)  Hydrochlorothiazide 25 Mg Tabs (Hydrochlorothiazide) .... 1/2 Tab Once Daily 3)  Zyrtec Allergy 10 Mg Tabs (Cetirizine Hcl) .... Once Daily 4)  Timoptic Ocudose 0.5 % Soln (Timolol Maleate) .... One Drop To Each Eye Daily 5)  Clindamycin Hcl 150 Mg Caps (Clindamycin Hcl) .... As Directed 6)  Aclovate 0.05 % Crea (Alclometasone Dipropionate) .... As Directed 7)  Caltrate 600+d 600-400 Mg-Unit Tabs (Calcium Carbonate-Vitamin D) .... Once Daily 8)  Ocuvite Preservision  Tabs (Multiple Vitamins-Minerals) .... Two Times A Day 9)  Co Q-10 Vitamin E Fish Oil 60-90-25-200 Caps (Dha-Epa-Coenzyme Q10-Vitamin E) .... Once Daily 10)  Glucosamine-Chondroitin 1500-1200 Mg/25ml Liqd (Glucosamine-Chondroitin) .... Three Times A Day 11)  Claritin 10 Mg Tabs (Loratadine) .... Once Daily 12)  Clobetasol Propionate 0.05 % Oint (Clobetasol Propionate) ....  Apply As Directed Once Daily As Needed. 13)  Hydrocodone-Acetaminophen 5-325 Mg Tabs (Hydrocodone-Acetaminophen) .Marland Kitchen.. 1-2 By Mouth Q 4-6 Hours As Needed Pain 14)  Amlodipine Besylate 2.5 Mg Tabs (Amlodipine Besylate) .... One By Mouth Once Daily 15)  Hydroxyzine Hcl 25 Mg Tabs (Hydroxyzine Hcl) .... One By Mouth Q 8 Hours As Needed Itching  Allergies (verified): 1)  ! Advil (Ibuprofen) 2)  ! Penicillin V Potassium (Penicillin V Potassium) 3)  ! Codeine Sulfate (Codeine Sulfate) 4)  ! Cephalosporins  Past History:  Past Medical History: Last updated: 03/10/2009 Hypothyroidism Hyperlipidemia Hypertension Osteoarthritis PMH reviewed for relevance  Review of Systems      See HPI  Physical Exam  General:  Well-developed,well-nourished,in no acute distress; alert,appropriate and cooperative throughout examination Lungs:  Normal respiratory effort, chest expands symmetrically. Lungs are clear to auscultation, no crackles or wheezes. Heart:  Normal rate and regular rhythm. S1 and S2 normal without gallop, murmur, click, rub or other extra sounds. Skin:  rash well-healed from recent shingles   Impression & Recommendations:  Problem # 1:  SHINGLES (ICD-053.9) patient has significant postherpetic neuralgia pain. Discussed options. Start gabapentin 300 mg daily with titration regimen given.  Potential side effects discussed.  Refilled hydrocodone.  Complete Medication List: 1)  Synthroid 112 Mcg Tabs (Levothyroxine sodium) .... Once daily 2)  Hydrochlorothiazide 25 Mg Tabs (Hydrochlorothiazide) .... 1/2 tab once daily 3)  Zyrtec Allergy 10 Mg Tabs (Cetirizine hcl) .... Once daily 4)  Timoptic Ocudose 0.5 % Soln (Timolol  maleate) .... One drop to each eye daily 5)  Clindamycin Hcl 150 Mg Caps (Clindamycin hcl) .... As directed 6)  Aclovate 0.05 % Crea (Alclometasone dipropionate) .... As directed 7)  Caltrate 600+d 600-400 Mg-unit Tabs (Calcium carbonate-vitamin d) .... Once daily 8)   Ocuvite Preservision Tabs (Multiple vitamins-minerals) .... Two times a day 9)  Co Q-10 Vitamin E Fish Oil 60-90-25-200 Caps (Dha-epa-coenzyme q10-vitamin e) .... Once daily 10)  Glucosamine-chondroitin 1500-1200 Mg/11ml Liqd (Glucosamine-chondroitin) .... Three times a day 11)  Claritin 10 Mg Tabs (Loratadine) .... Once daily 12)  Clobetasol Propionate 0.05 % Oint (Clobetasol propionate) .... Apply as directed once daily as needed. 13)  Hydrocodone-acetaminophen 5-325 Mg Tabs (Hydrocodone-acetaminophen) .Marland Kitchen.. 1-2 by mouth q 4-6 hours as needed pain 14)  Amlodipine Besylate 2.5 Mg Tabs (Amlodipine besylate) .... One by mouth once daily 15)  Hydroxyzine Hcl 25 Mg Tabs (Hydroxyzine hcl) .... One by mouth q 8 hours as needed itching 16)  Gabapentin 300 Mg Caps (Gabapentin) .... Use as directed.  Patient Instructions: 1)  Start Gabapentin 300 mg one daily and after 3 days titrate to one twice daily.  If not adequate pain control after 3 days on twice daily then go to one three times daily. 2)  Please schedule a follow-up appointment in 2 weeks.  Prescriptions: HYDROCHLOROTHIAZIDE 25 MG TABS (HYDROCHLOROTHIAZIDE) 1/2 tab once daily  #90 x 3   Entered and Authorized by:   Evelena Peat MD   Signed by:   Evelena Peat MD on 02/06/2010   Method used:   [facility]        [facility] [address] [facility]       [address]       [place], [place]  [address]       Ph: [phone] or [phone]       Fax: [phone]   RxID:   [id number] GABAPENTIN 300 MG CAPS (GABAPENTIN) use as directed.  #90 x 3   Entered and Authorized by:   Evelena Peat MD   Signed by:   Evelena Peat MD on 02/06/2010   Method used:   [facility]        [facility] [address]       [address]       [place], [place]  [address]       Ph: [phone] or [phone]       Fax: [phone]   RxID:   [id number] HYDROCODONE-ACETAMINOPHEN 5-325 MG TABS (HYDROCODONE-ACETAMINOPHEN) 1-2 by  mouth q 4-6 hours as needed pain  #30 x 0   Entered and Authorized by:   Evelena Peat MD   Signed by:   Evelena Peat MD on 02/06/2010   Method used:   Print then Give to Patient   RxID:   [id number]

## 2010-11-14 NOTE — Progress Notes (Signed)
Summary: questions  Phone Note Call from Patient   Caller: Patient Call For: Evelena Peat MD Summary of Call: Pt. is complaining of swollen tongue with 2 lesions x 2-3 days.  Pt needs refil of Oxycodone, please.  [phone] Initial call taken by: Lynann Beaver CMA,  Mar 02, 2010 9:03 AM  Follow-up for Phone Call        spoke with pt .   Overall her pain is somewhat improved.  She has been taking Oxycodone fairly regularly q 6 hours and I have encouraged her to scale back to q8 hours.  She has what sounds like aphthous ulcers and will try OTC mouthwash. Follow-up by: Evelena Peat MD,  Mar 02, 2010 1:19 PM    Prescriptions: PERCOCET 7.5-500 MG TABS (OXYCODONE-ACETAMINOPHEN) one by mouth q 6 hours as needed severe pain  #20 x 0   Entered and Authorized by:   Evelena Peat MD   Signed by:   Evelena Peat MD on 03/02/2010   Method used:   Print then Give to Patient   RxID:   [id number]

## 2010-11-14 NOTE — Progress Notes (Signed)
Summary: Shingles  Phone Note Call from Patient   Caller: Spouse Reason for Call: Acute Illness Summary of Call: Patient's husband left a message on voice mail Friday - patient has shingles and is having increased pain and elevated BP.   Initial call taken by: Everrett Coombe,  February 06, 2010 8:56 AM  Follow-up for Phone Call        pt scheduled to be seen today. Follow-up by: Evelena Peat MD,  February 06, 2010 9:32 AM

## 2010-11-14 NOTE — Progress Notes (Signed)
Summary: continued pain, Rx Percocet  Phone Note Call from Patient   Caller: Patient Call For: Evelena Peat MD Summary of Call: Pt is still having severe pain with the shingles?? [phone] Initial call taken by: Lynann Beaver CMA,  Feb 15, 2010 10:47 AM  Follow-up for Phone Call        spoken with pt .  Pain still very intense at times on gabapentin 300 mg three times a day.  Vicodin not controlling pain at times.  Will increase gabapentin to 600mg  at bedtime and cont 300 mg at breakfast and lunch and switch from vicodin to oxycodone.  She will need to pick up rx. Follow-up by: Evelena Peat MD,  Feb 15, 2010 1:06 PM  Additional Follow-up for Phone Call Additional follow up Details #1::        Husband informed Rx ready for pick-up Additional Follow-up by: Sid Falcon LPN,  Feb 16, 4539 1:22 PM    New/Updated Medications: PERCOCET 7.5-500 MG TABS (OXYCODONE-ACETAMINOPHEN) one by mouth q 6 hours as needed severe pain Prescriptions: PERCOCET 7.5-500 MG TABS (OXYCODONE-ACETAMINOPHEN) one by mouth q 6 hours as needed severe pain  #30 x 0   Entered and Authorized by:   Evelena Peat MD   Signed by:   Evelena Peat MD on 02/15/2010   Method used:   Print then Give to Patient   RxID:   [id number]

## 2010-11-14 NOTE — Progress Notes (Signed)
Summary: Shingles  Phone Note Call from Patient Call back at Home Phone [phone]   Caller: Patient Summary of Call: Has shingles (ongoing), My pain and speech seems to be getting worse.  Have an appt Monday but wanted Dr. Caryl Never to know that I feeling worse now.  Please call back. Initial call taken by: Trixie Dredge,  May 03, 2010 9:27 AM  Follow-up for Phone Call        spoke with pt.  She has some ?dysarthria last week and we explained that her recent shingles would NOT cause that.  Her symptoms are somewhat better now.  I recommend that we get her in for appt tomorrow or Friday to reassess. Follow-up by: Evelena Peat MD,  May 03, 2010 1:20 PM  Additional Follow-up for Phone Call Additional follow up Details #1::        Appt schedule for 05/04/10 @ 9:45am Additional Follow-up by: Trixie Dredge,  May 03, 2010 2:42 PM

## 2010-11-14 NOTE — Progress Notes (Signed)
Summary: refill nortrip? - pain- neuro or pain  Phone Note Call from Patient Call back at [facility] Phone [phone]   Summary of Call: 1)Nortrip increased to 2 per day ?May 31 and only has 2 left.  Can get by on 1 per day if she has to.  Do you want her to continue it.  If so need refill. 2) How to deal with pain 4 - 10pm?  Oxycodone am & pm to sleep.  3)Should I go to neurololgic or pain clinic?  [facility] Initial call taken by: Rudy Jew, RN,  March 27, 2010 9:52 AM  Follow-up for Phone Call        refill nortriptyline at dose of 2 tablets nightly.  OK to refer to [facility] if pt interested. Follow-up by: Evelena Peat MD,  March 27, 2010 1:09 PM    New/Updated Medications: NORTRIPTYLINE HCL 50 MG CAPS (NORTRIPTYLINE HCL) Two by mouth at bedtime Prescriptions: NORTRIPTYLINE HCL 50 MG CAPS (NORTRIPTYLINE HCL) Two by mouth at bedtime  #60 x 0   Entered by:   Rudy Jew, RN   Authorized by:   Evelena Peat MD   Signed by:   Rudy Jew, RN on 03/27/2010   Method used:   [facility]        [facility]  [place] [address] [facility]       [address]       [place], [place]  [address]       Ph: [phone] or [phone]       Fax: [phone]   RxID:   [id number]

## 2010-11-14 NOTE — Assessment & Plan Note (Signed)
Summary: 2 wk rov/njr   Vital Signs:  Patient profile:   77 year old female Temp:     98 degrees F oral BP sitting:   130 / 80  (left arm) Cuff size:   regular  Vitals Entered By: Sid Falcon LPN (May 22, 2010 11:21 AM)  History of Present Illness: Patient seen for followup postherpetic neuralgia. She had some fatigue issues and speech impediment and our concern initially was whether she may have had a stroke. MRI did not confirm any acute stroke process. We felt her symptoms were related to overmedication. She has been tapering back her gabapentin and is on reduced dose of nortriptyline and feels that she is near baseline. She still has pain 6/10 at times from her shingles and is reluctant to taper further at this point.  She has developed some generalized weakness from loss of activity and would like to consider physical therapy. No recent falls.  Allergies: 1)  ! Advil (Ibuprofen) 2)  ! Penicillin V Potassium (Penicillin V Potassium) 3)  ! Codeine Sulfate (Codeine Sulfate) 4)  ! Cephalosporins  Past History:  Past Medical History: Last updated: 03/10/2009 Hypothyroidism Hyperlipidemia Hypertension Osteoarthritis PMH reviewed for relevance  Review of Systems  The patient denies anorexia, fever, weight loss, chest pain, and syncope.    Physical Exam  General:  Well-developed,well-nourished,in no acute distress; alert,appropriate and cooperative throughout examination Mouth:  Oral mucosa and oropharynx without lesions or exudates.  Teeth in good repair. Neck:  No deformities, masses, or tenderness noted. Lungs:  Normal respiratory effort, chest expands symmetrically. Lungs are clear to auscultation, no crackles or wheezes. Heart:  normal rate and regular rhythm.   Neurologic:  patient has some generalized weakness particularly left upper extremity and no focal weakness. She is able to transfer from sitting to standing without difficulty Psych:  normally interactive,  good eye contact, not anxious appearing, and not depressed appearing.     Impression & Recommendations:  Problem # 1:  WEAKNESS (ICD-780.79)  setup physical therapy for generalized strengthening  Orders: Physical Therapy Referral (PT)  Problem # 2:  POSTHERPETIC NEURALGIA (ICD-053.19) Assessment: Improved pain stable.  She will leave meds at current dose for now.  Hopefullly in the next month can taper off nortryptyline.  Complete Medication List: 1)  Synthroid 112 Mcg Tabs (Levothyroxine sodium) .... Once daily 2)  Hydrochlorothiazide 25 Mg Tabs (Hydrochlorothiazide) .... One tab once daily 3)  Zyrtec Allergy 10 Mg Tabs (Cetirizine hcl) .... As needed 4)  Timoptic Ocudose 0.5 % Soln (Timolol maleate) .... One drop to each eye two times a day 5)  Aclovate 0.05 % Crea (Alclometasone dipropionate) .... As directed 6)  Caltrate 600+d 600-400 Mg-unit Tabs (Calcium carbonate-vitamin d) .... Once daily 7)  Ocuvite Preservision Tabs (Multiple vitamins-minerals) .... Two times a day 8)  Co Q-10 Vitamin E Fish Oil 60-90-25-200 Caps (Dha-epa-coenzyme q10-vitamin e) .... Once daily 9)  Glucosamine-chondroitin 1500-1200 Mg/52ml Liqd (Glucosamine-chondroitin) .... Three times a day 10)  Claritin 10 Mg Tabs (Loratadine) .... As needed 11)  Clobetasol Propionate 0.05 % Oint (Clobetasol propionate) .... Apply as directed once daily as needed. 12)  Amlodipine Besylate 2.5 Mg Tabs (Amlodipine besylate) .... One by mouth at bedtime 13)  Gabapentin 300 Mg Caps (Gabapentin) .Marland Kitchen.. 1 tab one to  three times a day to 3 14)  Nortriptyline Hcl 25 Mg Caps (Nortriptyline hcl) .... One by mouth at bedtime 15)  Tramadol Hcl 50 Mg Tabs (Tramadol hcl) .Marland Kitchen.. 1-2 by mouth  q 6 hours as needed pain 16)  Zostrix Arthritis Pain Relief 0.025 % Crea (Capsaicin) .... Apply to affected area twice daily as needed 17)  Pepcid Ac Maximum Strength 20 Mg Tabs (Famotidine) .... As needed  Patient Instructions: 1)  Please schedule  a follow-up appointment in 1 month.

## 2010-11-14 NOTE — Assessment & Plan Note (Signed)
Summary: PER MD//SLM   Vital Signs:  Patient profile:   77 year old female Weight:      170 pounds Temp:     98.0 degrees F BP sitting:   110 / 82  (left arm) Cuff size:   regular  Vitals Entered By: Sid Falcon LPN (May 04, 2010 9:47 AM)  History of Present Illness: Patient seen accompanied by husband and daughter.  She's had severe case of left chest wall postherpetic neuralgia. Severe pain which required multiple medications for control. Currently using Zostrix cream, tramadol, nortriptyline, and gabapentin. She has tapered off oxycodone. She had rash with Lidoderm patch. Pain is still 6/10 but much more tolerable.  Has appt with pain management in August.  Family gives history that she has difficulty finding the right words frequently and apparently going on for at least a couple weeks. She has developed some generalized decrease in strength and stumbling gait at times. No actual fall.  Very sedentary since onset of neuralgia.  Using a cane to ambulate. Not so much  dysarthria as difficulty finding and getting out the right words at times. She is able to comprehend speech. Possibly some left-sided weakness but not clear that she's had any focal weakness.  she seems to have some difficulty carrying out purposeful tasks.  Also frequent GERD symptoms recently which have been controlled with Pepcid AC.  Allergies: 1)  ! Advil (Ibuprofen) 2)  ! Penicillin V Potassium (Penicillin V Potassium) 3)  ! Codeine Sulfate (Codeine Sulfate) 4)  ! Cephalosporins  Past History:  Past Medical History: Last updated: 03/10/2009 Hypothyroidism Hyperlipidemia Hypertension Osteoarthritis  Past Surgical History: Last updated: 03/10/2009 Cataract extraction Total hip replacement right 1999 left 2007 Hysterectomy, total vaginal Lumbar laminectomy SBO  Family History: Last updated: 03/10/2009 Family History Hypertension Family History of Prostate CA   Social History: Last updated:  03/10/2009 Retired Married Former Smoker Alcohol use-no  Risk Factors: Smoking Status: quit (02/06/2010) PMH-FH-SH reviewed for relevance  Review of Systems  The patient denies anorexia, fever, weight loss, chest pain, syncope, dyspnea on exertion, peripheral edema, headaches, abdominal pain, melena, hematochezia, and severe indigestion/heartburn.    Physical Exam  General:  Well-developed,well-nourished,in no acute distress; alert,appropriate and cooperative throughout examination Head:  Normocephalic and atraumatic without obvious abnormalities. No apparent alopecia or balding. Eyes:  pupils equal, pupils round, and pupils reactive to light.   Mouth:  slightly dry mucosa otherwise clear Neck:  No deformities, masses, or tenderness noted. Lungs:  Normal respiratory effort, chest expands symmetrically. Lungs are clear to auscultation, no crackles or wheezes. Heart:  normal rate and regular rhythm.   Extremities:  no edema Neurologic:  she requires some minimal assistance with gaitalert & oriented X3, cranial nerves II-XII intact, strength normal in all extremities, sensation intact to light touch, finger-to-nose normal, toes down bilaterally on Babinski, and Romberg negative.     Impression & Recommendations:  Problem # 1:  DYSPRAXIA (ICD-315.4) MRI of brain to further evaluate.  Orders: Radiology Referral (Radiology)  Problem # 2:  POSTHERPETIC NEURALGIA (ICD-053.19) Discussed our goal of scaling back some of her meds.  She will try to reduce nortriptyline to one at night.  Problem # 3:  WEAKNESS (ICD-780.79) general deconditioning.  Set up PT if MRI normal.  Complete Medication List: 1)  Synthroid 112 Mcg Tabs (Levothyroxine sodium) .... Once daily 2)  Hydrochlorothiazide 25 Mg Tabs (Hydrochlorothiazide) .... One tab once daily 3)  Zyrtec Allergy 10 Mg Tabs (Cetirizine hcl) .... As needed 4)  Timoptic Ocudose 0.5 % Soln (Timolol maleate) .... One drop to each eye  daily 5)  Aclovate 0.05 % Crea (Alclometasone dipropionate) .... As directed 6)  Caltrate 600+d 600-400 Mg-unit Tabs (Calcium carbonate-vitamin d) .... Once daily 7)  Ocuvite Preservision Tabs (Multiple vitamins-minerals) .... Two times a day 8)  Co Q-10 Vitamin E Fish Oil 60-90-25-200 Caps (Dha-epa-coenzyme q10-vitamin e) .... Once daily 9)  Glucosamine-chondroitin 1500-1200 Mg/55ml Liqd (Glucosamine-chondroitin) .... Three times a day 10)  Claritin 10 Mg Tabs (Loratadine) .... As needed 11)  Clobetasol Propionate 0.05 % Oint (Clobetasol propionate) .... Apply as directed once daily as needed. 12)  Amlodipine Besylate 2.5 Mg Tabs (Amlodipine besylate) .... One by mouth at bedtime 13)  Gabapentin 300 Mg Caps (Gabapentin) .... One by mouth tid 14)  Nortriptyline Hcl 50 Mg Caps (Nortriptyline hcl) .... Two by mouth at bedtime 15)  Tramadol Hcl 50 Mg Tabs (Tramadol hcl) .Marland Kitchen.. 1-2 by mouth q 6 hours as needed pain 16)  Lidoderm 5 % Ptch (Lidocaine) .... Apply patch to affected area 12 hours/day as directed. 17)  Zostrix Arthritis Pain Relief 0.025 % Crea (Capsaicin) .... Apply to affected area twice daily as needed 18)  Pepcid Ac Maximum Strength 20 Mg Tabs (Famotidine) .... As needed  Patient Instructions: 1)  try reducing nortriptyline to one tablet at night 2)  We will call you regarding appointment for MRI scan. 3)  If MRI scan is negative we will schedule physical therapy 4)  Use tramadol on as-needed basis only

## 2010-11-14 NOTE — Letter (Signed)
Summary: [facility]  [facility]   Imported By: Maryln Gottron 05/10/2010 13:09:35  _____________________________________________________________________  External Attachment:    Type:   Image     Comment:   External Document

## 2010-11-14 NOTE — Progress Notes (Signed)
Summary: refill oxycodone  Phone Note Call from Patient Call back at [facility] Phone [phone]   Summary of Call: 2 very painful days Fri & Mon with shingles, getting me down.  Pain in places that hadn't pained in a week.  No new outbreak.  Still same hot spots where started, about 5 spots mid chest, end of breast, back of armpit, shoulder all came alive with pain yesterday & last night slept 2 hours.   Using oxycodone two times a day.  Acid reflux, taking Pepcid AC 2 pm that helps.  Please help, advise. Initial call taken by: Rudy Jew, RN,  Mar 14, 2010 9:13 AM  Follow-up for Phone Call        titrate nortriptyline to 50 mg 2 tablets at night. Follow-up by: Evelena Peat MD,  Mar 14, 2010 12:39 PM  Additional Follow-up for Phone Call Additional follow up Details #1::        Patient aware of Dr. Senaida Lange advice.  Needs refill of oxycodone.  [facility]. Additional Follow-up by: Rudy Jew, RN,  Mar 14, 2010 1:59 PM    Additional Follow-up for Phone Call Additional follow up Details #2::    will refill  Follow-up by: Evelena Peat MD,  March 15, 2010 8:16 AM  Additional Follow-up for Phone Call Additional follow up Details #3:: Details for Additional Follow-up Action Taken: Husband informed Rx ready for pick-up Additional Follow-up by: Sid Falcon LPN,  March 16, 2955 8:32 AM  Prescriptions: PERCOCET 7.5-500 MG TABS (OXYCODONE-ACETAMINOPHEN) one by mouth q 6 hours as needed severe pain  #20 x 0   Entered and Authorized by:   Evelena Peat MD   Signed by:   Evelena Peat MD on 03/15/2010   Method used:   Print then Give to Patient   RxID:   [id number]

## 2010-11-14 NOTE — Assessment & Plan Note (Signed)
Summary: er fup//ccm   Vital Signs:  Patient profile:   77 year old female Weight:      171 pounds Temp:     98.8 degrees F oral BP sitting:   120 / 78  (left arm) Cuff size:   regular  Vitals Entered By: Sid Falcon LPN (January 23, 2010 9:10 AM) CC: ER Follow-up, HBP and shingles   History of Present Illness: Patient is here for followup ER visits over the weekend.  Thursday noted left periscapular pain. By Friday she noticed some rash and elevated blood pressure of 202/109. Went to local urgent care chest x-ray unremarkable. Diagnosed with shingles and started on Valtrex, prednisone, and pain medication with hydrocodone. Given some type of medication to reduce blood pressure in urgent care and by Sunday had dizziness with blood pressure of 90/50. Went back to emergency room and was given some IV fluids since then blood pressure has been stable.  At baseline takes hydrochlorothiazide 12.5 mg daily and remains on that. She stopped her other blood pressure medications that were given in ER. She does recall being given Lasix  Shingles pain is moderate and improved with hydrocodone. Tolerating Valtrex.  Allergies: 1)  ! Advil (Ibuprofen) 2)  ! Penicillin V Potassium (Penicillin V Potassium) 3)  ! Codeine Sulfate (Codeine Sulfate) 4)  ! Cephalosporins  Past History:  Past Medical History: Last updated: 03/10/2009 Hypothyroidism Hyperlipidemia Hypertension Osteoarthritis PMH reviewed for relevance  Review of Systems  The patient denies anorexia, fever, weight loss, chest pain, syncope, dyspnea on exertion, peripheral edema, prolonged cough, headaches, hemoptysis, abdominal pain, melena, hematochezia, and severe indigestion/heartburn.    Physical Exam  General:  Well-developed,well-nourished,in no acute distress; alert,appropriate and cooperative throughout examination Eyes:  No corneal or conjunctival inflammation noted. EOMI. Perrla. Funduscopic exam benign, without  hemorrhages, exudates or papilledema. Vision grossly normal. Mouth:  Oral mucosa and oropharynx without lesions or exudates.  Teeth in good repair. Neck:  No deformities, masses, or tenderness noted. Lungs:  Normal respiratory effort, chest expands symmetrically. Lungs are clear to auscultation, no crackles or wheezes. Heart:  normal rate and regular rhythm.   Skin:  patient has extensive rash which is slightly raised blistery erythematous base and several patches of upper left thoracic region radiating toward breast   Impression & Recommendations:  Problem # 1:  SHINGLES (ICD-053.9) Assessment New leave off prednisone since no compelling indication and could exacerbate blood pressure. Continue Valtrex and hydrocodone as needed with refill hydrocodone given  Problem # 2:  HYPERTENSION (ICD-401.9) Assessment: Deteriorated stable today. Continue low-dose hydrochlorothiazide and close monitoring and reassess 2 weeks Her updated medication list for this problem includes:    Hydrochlorothiazide 25 Mg Tabs (Hydrochlorothiazide) .Marland Kitchen... 1/2 tab once daily  Complete Medication List: 1)  Synthroid 112 Mcg Tabs (Levothyroxine sodium) .... Once daily 2)  Hydrochlorothiazide 25 Mg Tabs (Hydrochlorothiazide) .... 1/2 tab once daily 3)  Zyrtec Allergy 10 Mg Tabs (Cetirizine hcl) .... Once daily 4)  Timoptic Ocudose 0.5 % Soln (Timolol maleate) .... One drop to each eye daily 5)  Clindamycin Hcl 150 Mg Caps (Clindamycin hcl) .... As directed 6)  Aclovate 0.05 % Crea (Alclometasone dipropionate) .... As directed 7)  Caltrate 600+d 600-400 Mg-unit Tabs (Calcium carbonate-vitamin d) .... Once daily 8)  Ocuvite Preservision Tabs (Multiple vitamins-minerals) .... Two times a day 9)  Co Q-10 Vitamin E Fish Oil 60-90-25-200 Caps (Dha-epa-coenzyme q10-vitamin e) .... Once daily 10)  Glucosamine-chondroitin 1500-1200 Mg/25ml Liqd (Glucosamine-chondroitin) .... Three times a day 11)  Claritin 10 Mg Tabs  (Loratadine) .... Once daily 12)  Clobetasol Propionate 0.05 % Oint (Clobetasol propionate) .... Apply as directed once daily as needed. 13)  Hydrocodone-acetaminophen 5-325 Mg Tabs (Hydrocodone-acetaminophen) .Marland Kitchen.. 1-2 by mouth q 4-6 hours as needed pain  Patient Instructions: 1)  leave off prednisone 2)  Continue Valtrex  and hydrocodone as needed. 3)  Please schedule a follow-up appointment in 2 weeks.  4)  Check your  Blood Pressure regularly . If it is above:160/100   you should make an appointment. Prescriptions: HYDROCODONE-ACETAMINOPHEN 5-325 MG TABS (HYDROCODONE-ACETAMINOPHEN) 1-2 by mouth q 4-6 hours as needed pain  #40 x 0   Entered and Authorized by:   Evelena Peat MD   Signed by:   Evelena Peat MD on 01/23/2010   Method used:   Print then Give to Patient   RxID:   [id number]

## 2010-11-14 NOTE — Assessment & Plan Note (Signed)
Summary: 1 month rov/njr   Vital Signs:  Patient profile:   77 year old female Temp:     98.2 degrees F oral BP sitting:   120 / 80  (left arm) Cuff size:   regular  Vitals Entered By: Sid Falcon LPN (April 10, 2010 10:46 AM) CC: 1 month follow-up   History of Present Illness: Follow up postherpetic neuralgia.  Pain had been 8 out of 10 and recent addition of lidocaine patch, ultram, and zostrix cream and great improvement.  Now 4 out of 10 pain. Sleeping better.  Remains on gabapentin and nortryptyline.  Allergies: 1)  ! Advil (Ibuprofen) 2)  ! Penicillin V Potassium (Penicillin V Potassium) 3)  ! Codeine Sulfate (Codeine Sulfate) 4)  ! Cephalosporins  Past History:  Past Medical History: Last updated: 03/10/2009 Hypothyroidism Hyperlipidemia Hypertension Osteoarthritis  Physical Exam  General:  Well-developed,well-nourished,in no acute distress; alert,appropriate and cooperative throughout examination Neck:  No deformities, masses, or tenderness noted. Lungs:  Normal respiratory effort, chest expands symmetrically. Lungs are clear to auscultation, no crackles or wheezes. Heart:  normal rate and regular rhythm.   Skin:  no rashes.     Impression & Recommendations:  Problem # 1:  POSTHERPETIC NEURALGIA (ICD-053.19) Assessment Improved continue current meds.  Will f/u one month and hopefully be able to start slow taper of meds then. Has already d/ced percocet.  Complete Medication List: 1)  Synthroid 112 Mcg Tabs (Levothyroxine sodium) .... Once daily 2)  Hydrochlorothiazide 25 Mg Tabs (Hydrochlorothiazide) .... One tab once daily 3)  Zyrtec Allergy 10 Mg Tabs (Cetirizine hcl) .... As needed 4)  Timoptic Ocudose 0.5 % Soln (Timolol maleate) .... One drop to each eye daily 5)  Clindamycin Hcl 150 Mg Caps (Clindamycin hcl) .... As directed 6)  Aclovate 0.05 % Crea (Alclometasone dipropionate) .... As directed 7)  Caltrate 600+d 600-400 Mg-unit Tabs (Calcium  carbonate-vitamin d) .... Once daily 8)  Ocuvite Preservision Tabs (Multiple vitamins-minerals) .... Two times a day 9)  Co Q-10 Vitamin E Fish Oil 60-90-25-200 Caps (Dha-epa-coenzyme q10-vitamin e) .... Once daily 10)  Glucosamine-chondroitin 1500-1200 Mg/24ml Liqd (Glucosamine-chondroitin) .... Three times a day 11)  Claritin 10 Mg Tabs (Loratadine) .... As needed 12)  Clobetasol Propionate 0.05 % Oint (Clobetasol propionate) .... Apply as directed once daily as needed. 13)  Amlodipine Besylate 2.5 Mg Tabs (Amlodipine besylate) .... One by mouth at bedtime 14)  Gabapentin 300 Mg Caps (Gabapentin) .... Use as directed. 15)  Nortriptyline Hcl 50 Mg Caps (Nortriptyline hcl) .... Two by mouth at bedtime 16)  Tramadol Hcl 50 Mg Tabs (Tramadol hcl) .Marland Kitchen.. 1-2 by mouth q 6 hours as needed pain 17)  Lidoderm 5 % Ptch (Lidocaine) .... Apply patch to affected area 12 hours/day as directed.  Patient Instructions: 1)  Please schedule a follow-up appointment in 1 month.

## 2010-11-14 NOTE — Progress Notes (Signed)
Summary: low BP  Phone Note Call from Patient   Summary of Call: BP lows today & woozy.  106/65, 114/70, 102/67.  One HCTZ am & 1 amlodipine daily pm.  Oxycodone 1 so far  today, 1 & 1/2 yesterday, Nortrip 1 pm Gabapenten 5 daily.  Concerned with low BP & the wooziness.  (Drink a couple extra glasses of water & lie down.  Husband there with her.) Initial call taken by: Rudy Jew, RN,  March 22, 2010 2:13 PM  Follow-up for Phone Call        bp now stable.  Dizziness resolved after fluids. Follow-up by: Evelena Peat MD,  March 22, 2010 6:05 PM

## 2010-11-16 NOTE — Assessment & Plan Note (Signed)
Summary: 3 MONTH ROV/NJR---PT [facility] // RS   Vital Signs:  Patient profile:   77 year old female Weight:      165 pounds Temp:     98.2 degrees F oral Pulse rate:   60 / minute Pulse rhythm:   regular Resp:     12 per minute BP sitting:   110 / 72  (left arm) Cuff size:   regular  Vitals Entered By: Sid Falcon LPN (September 26, 2010 10:23 AM)  History of Present Illness: Patient seen for followup. History of left thoracic postherpetic neuralgia. Overall improved but has plateaued. On good days 1-2/10 pain on bad days 5/10 pain. Has recently seen chiropractor who has done several treatments including laser, ultrasound, and electrode therapy with minimal if any improvement. She has tried titrating back gabapentin to twice daily but had breakthrough pain. Sleeping well. No recurrent rash.  Other issue of postnasal drainage symptoms. Laryngitis off and on for about 8 weeks. Former smoker. Occasional reflux symptoms but mostly postnasal drip symptoms not controlled with Zyrtec or Claritin. Occasional sneezing.  Hypertension treated with low dose ACE inh with no recent orthostatic sympotoms.  Hypertension History:      She denies headache, palpitations, dyspnea with exertion, orthopnea, PND, peripheral edema, visual symptoms, neurologic problems, syncope, and side effects from treatment.  She notes no problems with any antihypertensive medication side effects.        Positive major cardiovascular risk factors include female age 77 years old or older, hyperlipidemia, and hypertension.  Negative major cardiovascular risk factors include non-tobacco-user status.     Allergies: 1)  ! Advil (Ibuprofen) 2)  ! Penicillin V Potassium (Penicillin V Potassium) 3)  ! Codeine Sulfate (Codeine Sulfate) 4)  ! Cephalosporins  Past History:  Past Medical History: Last updated: 03/10/2009 Hypothyroidism Hyperlipidemia Hypertension Osteoarthritis  Past Surgical History: Last updated:  03/10/2009 Cataract extraction Total hip replacement right 1999 left 2007 Hysterectomy, total vaginal Lumbar laminectomy SBO  Family History: Last updated: 03/10/2009 Family History Hypertension Family History of Prostate CA   Social History: Last updated: 03/10/2009 Retired Married Former Smoker Alcohol use-no  Risk Factors: Smoking Status: quit (02/06/2010) PMH-FH-SH reviewed for relevance  Physical Exam  General:  Well-developed,well-nourished,in no acute distress; alert,appropriate and cooperative throughout examination Ears:  External ear exam shows no significant lesions or deformities.  Otoscopic examination reveals clear canals, tympanic membranes are intact bilaterally without bulging, retraction, inflammation or discharge. Hearing is grossly normal bilaterally. Nose:  External nasal examination shows no deformity or inflammation. Nasal mucosa are pink and moist without lesions or exudates. Mouth:  Oral mucosa and oropharynx without lesions or exudates.  Teeth in good repair. Neck:  No deformities, masses, or tenderness noted. Chest Wall:  no recurrent rash Lungs:  Normal respiratory effort, chest expands symmetrically. Lungs are clear to auscultation, no crackles or wheezes. Heart:  Normal rate and regular rhythm. S1 and S2 normal without gallop, murmur, click, rub or other extra sounds.   Impression & Recommendations:  Problem # 1:  POSTHERPETIC NEURALGIA (ICD-053.19)  Problem # 2:  CHRONIC LARYNGITIS (ICD-476.0) Assessment: New possibly related to postnasal drainage versus reflux. Trial of Flonase nasal if no better couple weeks consider ENT referral with prior history of smoking  Problem # 3:  HYPERTENSION (ICD-401.9)  Her updated medication list for this problem includes:    Hydrochlorothiazide 25 Mg Tabs (Hydrochlorothiazide) ..... One tab once daily    Amlodipine Besylate 2.5 Mg Tabs (Amlodipine besylate) ..... One by mouth at  bedtime  Complete  Medication List: 1)  Synthroid 112 Mcg Tabs (Levothyroxine sodium) .... Once daily 2)  Hydrochlorothiazide 25 Mg Tabs (Hydrochlorothiazide) .... One tab once daily 3)  Zyrtec Allergy 10 Mg Tabs (Cetirizine hcl) .... As needed 4)  Timoptic Ocudose 0.5 % Soln (Timolol maleate) .... One drop to each eye two times a day 5)  Aclovate 0.05 % Crea (Alclometasone dipropionate) .... As directed 6)  Caltrate 600+d 600-400 Mg-unit Tabs (Calcium carbonate-vitamin d) .... Once daily 7)  Ocuvite Preservision Tabs (Multiple vitamins-minerals) .... Two times a day 8)  Co Q-10 Vitamin E Fish Oil 60-90-25-200 Caps (Dha-epa-coenzyme q10-vitamin e) .... Once daily 9)  Glucosamine-chondroitin 1500-1200 Mg/74ml Liqd (Glucosamine-chondroitin) .... Three times a day 10)  Claritin 10 Mg Tabs (Loratadine) .... As needed 11)  Clobetasol Propionate 0.05 % Oint (Clobetasol propionate) .... Apply as directed once daily as needed. 12)  Amlodipine Besylate 2.5 Mg Tabs (Amlodipine besylate) .... One by mouth at bedtime 13)  Gabapentin 300 Mg Caps (Gabapentin) .Marland Kitchen.. 1 tab one to  three times a day to 3 14)  Nortriptyline Hcl 25 Mg Caps (Nortriptyline hcl) .... One by mouth at bedtime 15)  Tramadol Hcl 50 Mg Tabs (Tramadol hcl) .Marland Kitchen.. 1-2 by mouth q 6 hours as needed pain 16)  Fluticasone Propionate 50 Mcg/act Susp (Fluticasone propionate) .... 2 sprays per nostril once daily  Hypertension Assessment/Plan:      The patient's hypertensive risk group is category B: At least one risk factor (excluding diabetes) with no target organ damage.  Today's blood pressure is 110/72.    Patient Instructions: 1)  Touch base in 2 weeks if laryngitis is not proved 2)  Please schedule a follow-up appointment in 4 months .  Prescriptions: FLUTICASONE PROPIONATE 50 MCG/ACT SUSP (FLUTICASONE PROPIONATE) 2 sprays per nostril once daily  #1 x 6   Entered and Authorized by:   Evelena Peat MD   Signed by:   Evelena Peat MD on 09/26/2010    Method used:   Electronically to        [facility]  [place] [address] (retail)       [address] [place] [address]       [place], [place]  [address]       Ph: [phone] or [phone]       Fax: [phone]   RxID:   [id number]    Orders Added: 1)  Est. Patient Level IV [74259]

## 2010-11-16 NOTE — Assessment & Plan Note (Signed)
Summary: 2 wk rov/njr   Vital Signs:  Patient profile:   77 year old female Weight:      164 pounds Temp:     98.1 degrees F oral BP sitting:   122 / 72  (left arm) Cuff size:   regular  Vitals Entered By: Sid Falcon LPN (October 23, 2010 10:20 AM)  History of Present Illness: Hoarse for about 3 months. We queried PND vs GERD.  NO improvent Flonase or Pepcid AC. No active heart burn symptoms.  Ex-smoker, none since 1993. No sore throat.  Weight stable.  Good appetite and no dysphagia.  Allergies: 1)  ! Advil (Ibuprofen) 2)  ! Penicillin V Potassium (Penicillin V Potassium) 3)  ! Codeine Sulfate (Codeine Sulfate) 4)  ! Cephalosporins  Past History:  Past Medical History: Last updated: 03/10/2009 Hypothyroidism Hyperlipidemia Hypertension Osteoarthritis  Past Surgical History: Last updated: 03/10/2009 Cataract extraction Total hip replacement right 1999 left 2007 Hysterectomy, total vaginal Lumbar laminectomy SBO  Review of Systems       The patient complains of hoarseness.  The patient denies anorexia, fever, weight loss, and prolonged cough.    Physical Exam  General:  Well-developed,well-nourished,in no acute distress; alert,appropriate and cooperative throughout examination Ears:  External ear exam shows no significant lesions or deformities.  Otoscopic examination reveals clear canals, tympanic membranes are intact bilaterally without bulging, retraction, inflammation or discharge. Hearing is grossly normal bilaterally. Mouth:  Oral mucosa and oropharynx without lesions or exudates.  Teeth in good repair. Neck:  No deformities, masses, or tenderness noted. Lungs:  Normal respiratory effort, chest expands symmetrically. Lungs are clear to auscultation, no crackles or wheezes. Heart:  normal rate and regular rhythm.   Cervical Nodes:  No lymphadenopathy noted   Impression & Recommendations:  Problem # 1:  CHRONIC LARYNGITIS (ICD-476.0) ENT referral  given duration of symptoms.  Still wonder if this is GERD related.  She is encouraged to elevate head of bed adn cont with antacids. Orders: ENT Referral (ENT)  Complete Medication List: 1)  Synthroid 112 Mcg Tabs (Levothyroxine sodium) .... Once daily 2)  Hydrochlorothiazide 25 Mg Tabs (Hydrochlorothiazide) .... One tab once daily 3)  Zyrtec Allergy 10 Mg Tabs (Cetirizine hcl) .... As needed 4)  Timoptic Ocudose 0.5 % Soln (Timolol maleate) .... One drop to each eye two times a day 5)  Aclovate 0.05 % Crea (Alclometasone dipropionate) .... As directed 6)  Caltrate 600+d 600-400 Mg-unit Tabs (Calcium carbonate-vitamin d) .... Once daily 7)  Ocuvite Preservision Tabs (Multiple vitamins-minerals) .... Two times a day 8)  Co Q-10 Vitamin E Fish Oil 60-90-25-200 Caps (Dha-epa-coenzyme q10-vitamin e) .... Once daily 9)  Glucosamine-chondroitin 1500-1200 Mg/20ml Liqd (Glucosamine-chondroitin) .... Three times a day 10)  Claritin 10 Mg Tabs (Loratadine) .... As needed 11)  Clobetasol Propionate 0.05 % Oint (Clobetasol propionate) .... Apply as directed once daily as needed. 12)  Amlodipine Besylate 2.5 Mg Tabs (Amlodipine besylate) .... One by mouth at bedtime 13)  Gabapentin 300 Mg Caps (Gabapentin) .Marland Kitchen.. 1 tab one to  three times a day to 3 14)  Nortriptyline Hcl 25 Mg Caps (Nortriptyline hcl) .... One by mouth at bedtime 15)  Tramadol Hcl 50 Mg Tabs (Tramadol hcl) .Marland Kitchen.. 1-2 by mouth q 6 hours as needed pain 16)  Fluticasone Propionate 50 Mcg/act Susp (Fluticasone propionate) .... 2 sprays per nostril once daily  Patient Instructions: 1)  Schedule Medicare Wellness exam for April Prescriptions: CLOBETASOL PROPIONATE 0.05 % OINT (CLOBETASOL PROPIONATE) apply as directed  once daily as needed.  #45 gm x 2   Entered and Authorized by:   Evelena Peat MD   Signed by:   Evelena Peat MD on 10/23/2010   Method used:   [facility] to        [facility]  [place] [address] (retail)       [address]       [place], [place]  [address]       Ph: [phone] or [phone]       Fax: [phone]   RxID:   [id number]    Orders Added: 1)  ENT Referral [ENT] 2)  Est. Patient Level III [27253]

## 2010-11-22 NOTE — Consult Note (Signed)
Summary: ENT-Dr. Narda Bonds  ENT-Dr. Narda Bonds   Imported By: Maryln Gottron 11/14/2010 10:25:05  _____________________________________________________________________  External Attachment:    Type:   Image     Comment:   External Document

## 2010-12-30 ENCOUNTER — Other Ambulatory Visit: Payer: Self-pay | Admitting: Family Medicine

## 2011-01-03 LAB — POCT I-STAT, CHEM 8
Calcium, Ion: 1.08 mmol/L — ABNORMAL LOW (ref 1.12–1.32)
Creatinine, Ser: 0.4 mg/dL (ref 0.4–1.2)
Glucose, Bld: 106 mg/dL — ABNORMAL HIGH (ref 70–99)
HCT: 41 % (ref 36.0–46.0)
Hemoglobin: 13.9 g/dL (ref 12.0–15.0)

## 2011-01-03 LAB — CBC
HCT: 38.4 % (ref 36.0–46.0)
Hemoglobin: 12.8 g/dL (ref 12.0–15.0)
MCHC: 33.3 g/dL (ref 30.0–36.0)
MCV: 92.4 fL (ref 78.0–100.0)
Platelets: 224 10*3/uL (ref 150–400)
RBC: 4.15 MIL/uL (ref 3.87–5.11)
RDW: 13.5 % (ref 11.5–15.5)
WBC: 6.9 10*3/uL (ref 4.0–10.5)

## 2011-01-03 LAB — DIFFERENTIAL
Basophils Absolute: 0 10*3/uL (ref 0.0–0.1)
Basophils Relative: 0 % (ref 0–1)
Eosinophils Absolute: 0 10*3/uL (ref 0.0–0.7)
Eosinophils Relative: 0 % (ref 0–5)
Monocytes Absolute: 0.5 10*3/uL (ref 0.1–1.0)
Neutro Abs: 5.1 10*3/uL (ref 1.7–7.7)

## 2011-01-03 LAB — URINALYSIS, ROUTINE W REFLEX MICROSCOPIC
Bilirubin Urine: NEGATIVE
Hgb urine dipstick: NEGATIVE
Ketones, ur: NEGATIVE mg/dL
Nitrite: NEGATIVE
Protein, ur: NEGATIVE mg/dL
Specific Gravity, Urine: 1.006 (ref 1.005–1.030)
Urobilinogen, UA: 0.2 mg/dL (ref 0.0–1.0)

## 2011-02-01 ENCOUNTER — Other Ambulatory Visit: Payer: Self-pay | Admitting: Family Medicine

## 2011-02-01 DIAGNOSIS — Z1231 Encounter for screening mammogram for malignant neoplasm of breast: Secondary | ICD-10-CM

## 2011-02-07 ENCOUNTER — Encounter: Payer: Self-pay | Admitting: Family Medicine

## 2011-02-08 ENCOUNTER — Ambulatory Visit (INDEPENDENT_AMBULATORY_CARE_PROVIDER_SITE_OTHER): Payer: Medicare Other | Admitting: Family Medicine

## 2011-02-08 ENCOUNTER — Encounter: Payer: Self-pay | Admitting: Family Medicine

## 2011-02-08 DIAGNOSIS — E785 Hyperlipidemia, unspecified: Secondary | ICD-10-CM

## 2011-02-08 DIAGNOSIS — Z23 Encounter for immunization: Secondary | ICD-10-CM

## 2011-02-08 DIAGNOSIS — H1010 Acute atopic conjunctivitis, unspecified eye: Secondary | ICD-10-CM

## 2011-02-08 DIAGNOSIS — E039 Hypothyroidism, unspecified: Secondary | ICD-10-CM

## 2011-02-08 DIAGNOSIS — Z0000 Encounter for general adult medical examination without abnormal findings: Secondary | ICD-10-CM

## 2011-02-08 DIAGNOSIS — I10 Essential (primary) hypertension: Secondary | ICD-10-CM

## 2011-02-08 LAB — LIPID PANEL
Cholesterol: 239 mg/dL — ABNORMAL HIGH (ref 0–200)
HDL: 71.9 mg/dL (ref >39.00)
Triglycerides: 76 mg/dL (ref 0.0–149.0)
VLDL: 15.2 mg/dL (ref 0.0–40.0)

## 2011-02-08 LAB — BASIC METABOLIC PANEL
BUN: 13 mg/dL (ref 6–23)
CO2: 28 mEq/L (ref 19–32)
Chloride: 98 mEq/L (ref 96–112)
Creatinine, Ser: 0.6 mg/dL (ref 0.4–1.2)

## 2011-02-08 LAB — TSH: TSH: 2.89 u[IU]/mL (ref 0.35–5.50)

## 2011-02-08 NOTE — Progress Notes (Signed)
Subjective:    Patient ID: Carolyn Henderson, female    DOB: August 08, 1930, 77 y.o.   MRN: [id number]  HPI Patient here for Medicare wellness exam and followup of medical problems.  She has history of hypertension. Medications reviewed. Compliant with all. No recent dizziness or orthostasis. History of severe case of shingles last year. Still has some neuropathy pains but overall greatly improved. She has tried tapering her gabapentin and nortriptyline without much success. Fairly well controlled pain at this time on current doses.  History of hypothyroidism treated with Synthroid. Recent thyroid at goal.  1.  Risk factors based on Past Medical , Social, and Family history  She has history of hypothyroidism, hyperlipidemia, hypertension, and osteoarthritis. Previous surgeries including total vaginal hysterectomy, total hip replacement, cataract surgery, and lumbar laminectomy. No history of smoking. No alcohol use. 2.  Limitations in physical activities no recent fall and low risk for fall 3.  Depression/mood no recent problems with anxiety or depression 4.  Hearing  she has hearing assessment scheduled with cardiologist next month. Does have some mild hearing loss with high pitched noises 5.  ADLs  fully independent in all 6.  Cognitive function (orientation to time and place, language, writing, speech,memory)  no deficits with short or long-term memory. No language deficits. Judgment intact. 7.  Home Safety  no safety issues identified 8.  Height, weight, and visual acuity.  Height and weight are stable. She continues to get yearly eye exams. 9.  Counseling  discussed weight loss, exercise, and diet. 10. Recommendation of preventive services.  Tetanus booster. Consider shingles vaccine though she has had prior shingles clinically 11. Labs based on risk factors  TSH, basic metabolic panel, and lipid panel 12. Care Plan  patient has not had shingles vaccine but had case of shingles last year. She  wishes to defer at this time. Tetanus booster given. Proceed with hearing evaluation. Continue yearly exam. Colonoscopy up to date 13 Advance Directives.  Both she and her husband have a living will. Long discussion regarding DO NOT RESUSCITATE orders and they both request this and these are completed.    Review of Systems  Constitutional: Negative for fever, activity change, appetite change and fatigue.  HENT: Negative for hearing loss, ear pain, sore throat and trouble swallowing.   Eyes: Negative for visual disturbance.  Respiratory: Negative for cough and shortness of breath.   Cardiovascular: Negative for chest pain and palpitations.  Gastrointestinal: Negative for abdominal pain, diarrhea, constipation and blood in stool.  Genitourinary: Negative for dysuria and hematuria.  Musculoskeletal: Negative for myalgias, back pain and arthralgias.  Skin: Negative for rash.  Neurological: Negative for dizziness, syncope and headaches.  Hematological: Negative for adenopathy.  Psychiatric/Behavioral: Negative for confusion and dysphoric mood.       Objective:   Physical Exam  Constitutional: She is oriented to person, place, and time. She appears well-developed and well-nourished.  HENT:  Head: Normocephalic and atraumatic.  Eyes: EOM are normal. Pupils are equal, round, and reactive to light.  Neck: Normal range of motion. Neck supple. No thyromegaly present.  Cardiovascular: Normal rate, regular rhythm and normal heart sounds.   No murmur heard. Pulmonary/Chest: Breath sounds normal. No respiratory distress. She has no wheezes. She has no rales.  Abdominal: Soft. Bowel sounds are normal. She exhibits no distension and no mass. There is no tenderness. There is no rebound and no guarding.  Musculoskeletal: Normal range of motion. She exhibits no edema.  Lymphadenopathy:    She  has no cervical adenopathy.  Neurological: She is alert and oriented to person, place, and time. She displays  normal reflexes. No cranial nerve deficit.  Skin: No rash noted.  Psychiatric: She has a normal mood and affect. Her behavior is normal. Judgment and thought content normal.          Assessment & Plan:  #1 Medicare wellness exam. Evaluation as above. Patient needs tetanus booster. Lab work as above. Advanced directives discussed. DO NOT RESUSCITATE order given for her husband and wife after full discussion #2 hypertension stable. Continue current medications #3 history of postherpetic neuralgia. Continue current medications. Consider eventual tapering of gabapentin as tolerated #4 hypothyroidism. Recent labs at goal

## 2011-02-09 ENCOUNTER — Telehealth: Payer: Self-pay

## 2011-02-09 NOTE — Telephone Encounter (Signed)
Called and spoke with pt; pt aware

## 2011-02-20 ENCOUNTER — Ambulatory Visit
Admission: RE | Admit: 2011-02-20 | Discharge: 2011-02-20 | Disposition: A | Discharge status: Home / Self Care | Payer: Medicare Other | Source: Ambulatory Visit | Attending: Family Medicine | Admitting: Family Medicine

## 2011-02-20 DIAGNOSIS — Z1231 Encounter for screening mammogram for malignant neoplasm of breast: Secondary | ICD-10-CM

## 2011-03-02 NOTE — Op Note (Signed)
Carolyn Henderson, Carolyn Henderson               ACCOUNT NO.:  [id number]   MEDICAL RECORD NO.:  [id number]          PATIENT TYPE:  INP   LOCATION:  0004                         FACILITY:  [facility]   PHYSICIAN:  Madlyn Frankel. Charlann Boxer, M.D.  DATE OF BIRTH:  1930-02-05   DATE OF PROCEDURE:  09/24/2006  DATE OF DISCHARGE:                               OPERATIVE REPORT   PREOPERATIVE DIAGNOSIS:  Left hip end-stage osteoarthritis.   POSTOPERATIVE DIAGNOSIS:  Left hip end-stage osteoarthritis.   PROCEDURE:  Left total hip replacement.   COMPONENTS USED:  DePuy hip system with size 50 pinnacle cup, two  cancellous bone screws, a 50 mm x 36 metal-on-metal liner, an 11.3  standard stem with a 36 +1.5 ball.   SURGEON:  Madlyn Frankel. Charlann Boxer, M.D.   ASSISTANT:  Yetta Glassman. Loreta Ave, PA-C.   ANESTHESIA:  General.   BLOOD LOSS:  300 mL.   DRAINS:  Times 1.   COMPLICATIONS:  None.   INDICATIONS FOR PROCEDURE:  Ms. Osberg is a 77 year old female who has  been followed for end-stage degenerative changes of the left hip.  She  had had a history of right hip replacement that has been bothering her a  little bit.  She has some reluctance to proceed with surgery;  nonetheless, her pain was failing to respond to conservative measures;  and thus she wished to proceed with this surgery.  Risks and benefits  were reviewed with her; including the chance of infection, DVT,  dislocation, neurovascular injury, and persistent discomfort.  Consent  was obtained.   PROCEDURE IN DETAIL:  The patient was brought to the operative theater.  Once adequate anesthesia and preoperative antibiotics (1 gram of Ancef)  were administered, the patient was positioned in the right lateral  decubitus position with the left side up.  The left lower extremity was  then prepped and draped in a sterile fashion.  Following a pre scrub a  lateral incision was made for posterior approach to the hip.  The  iliotibial band and gluteus fascia was incised  for posterior approach.  The short external rotators were taken down separate from the posterior  capsule.  An L-capsulotomy was made and capsule saved for anatomic  repair as well as protection against the sciatic nerve.  The hip was  dislocated and the neck osteotomy was made relative to the tip of the  trochanter in the center of the head.  This is a 31-mm measurement from  the center of head and neck cut.   Attention was first directed to the acetabulum due to the size of the  femoral head; I was uncertain on the types of components that I wanted  to use.  Given her age and desire for remaining active, we were thinking  about a metal-on-metal component with her.   I reamed the acetabulum and got good bony bed preparation at 49-mm  reaming; and felt that I did not want to go up further.  With this I  chose the metal-on-metal 36 implant with an extra large liner.  This 50  mm cup was then impacted  with 35-40 degrees of abduction and 20 degrees  for flexion.  Two cancellous bone screws were placed; and I was happy  with the position, but used the trial liner for trial.  At this point,  attention was directed to the femur.  Femoral preparation was carried  out per routine with straight axial reaming and irrigation to prevent  fat emboli then broaching up to 11.3.   Based on her neck cut and the position of the trial component; a trial  reduction was carried out with a 36 +1.5 ball.  Based on the down leg  compared to the preoperative positioning, the leg lengths appeared to be  equal at this point.  There was a millimeter or two of shuck with  extension; and this hip was very stable throughout a range of motion  with flexion and internal rotation of at least 70-80 degrees, stable in  the sleep position, and stable with extension and external rotation.   Given all this, the final components were brought to the field holding  the ball size until I implanted the final component.  The  final hole  eliminator was placed, a 36 metal neutral liner was placed, and then an  11.3 standard stem was then impacted down to the neck cut.  Given this  location, the trial reduction of 36 +1.5 ball was then impacted onto a  clean and dry trunnion; and the hip reduced.  The hip was irrigated  throughout this case; and, again, at this point I reapproximated the  posterior leaflet to the superior leaflet with a #1 Ethibond.  The  iliotibial band and gluteus fascia was reapproximated using a #1 Vicryl.  The remainder of the wound was closed in layers with 2-0 Vicryl; and I  chose to use a stapler on this patient due to the fact that her  epidermal layer was very thin.   The skin was then cleaned, dried, and dressed sterilely with Adaptic  dressing sponge tape.  She was taken to the recovery room and extubated  in stable condition.      Madlyn Frankel Charlann Boxer, M.D.  Electronically Signed     MDO/MEDQ  D:  09/24/2006  T:  09/24/2006  Job:  [id number]

## 2011-03-02 NOTE — H&P (Signed)
NAMESACORA, HAWBAKER               ACCOUNT NO.:  [id number]   MEDICAL RECORD NO.:  [id number]          PATIENT TYPE:   LOCATION:                                 FACILITY:   PHYSICIAN:  Madlyn Frankel. Charlann Boxer, M.D.  DATE OF BIRTH:  [birth year]   DATE OF ADMISSION:  09/24/2006  DATE OF DISCHARGE:                                HISTORY & PHYSICAL   History and physical to follow.   CHIEF COMPLAINT:  Left hip pain.   HISTORY OF PRESENT ILLNESS:  This is a 77 year old female with a history of  bilateral hip pain.  She has a history of right hip replacement in 1999.  Her left hip has continued to give her pain and all conservative measures  have failed.  She has even tried an intraarticular injection which helped  somewhat for a very short of time.  Due to decreased quality of life and  ability to participate in activities of daily living, she has elected to go  ahead with a left total hip replacement.   PAST MEDICAL HISTORY:  Includes:  1. Hypertension.  2. Diverticulitis.  3. Hypothyroidism.  4. Osteoarthritis.  5. Postmenopausal.   PAST SURGICAL HISTORY:  Include:  1. Hysterectomy in 1969.  2. Partial colectomy in 1983 due to intestinal blockages.  3. Cataract surgery in 1997.  4. Ruptured disc in 1998.  5. Total right hip replacement in 1999 as mentioned in the history of      present illness.   FAMILY HISTORY:  Includes:  Hypertension, cancer, arthritis.   SOCIAL HISTORY:  The patient is married to [employer].  He will be the  primary caregiver after her surgery.   ALLERGIES:  CODEINE, PENICILLIN, AND CEPHALOSPORINS.   MEDICATIONS:  Include:  1. Synthroid 100 mcg, 1 tablet q. a.m. before eating.  2. Hydrochlorothiazide 25 mg, 1/2 tablet every morning.  3. Zyrtec 10 mg, 1 p.o. daily.  4. Tramadol HCl.  5. Acetaminophen 1 to 2 tablets p.o. q.6-8 h. p.r.n.  6. Timolol 0.5% eye drops, 1 drop in each eye 2 times a day.   She also takes the following supplements:  1.  Caltrate 200 mg plus D, 1 tablet 2 times a day.  2. vitamins 1 tablet twice a day.  3. Fish oil omega-3 1000 mg, 1 capsule 3 times a day.  4. MSN plus glucosamine 2 capsules 3 times a day.   REVIEW OF SYSTEMS:  No signs and symptoms of cardiovascular, respiratory,  gastrointestinal, genitourinary, urological, or musculoskeletal symptoms.   PHYSICAL EXAMINATION:  VITAL SIGNS:  Temperature 98.1, pulse 64,  respirations 16, blood pressure 152/94.  GENERAL:  She is awake, alert and oriented, well-developed, well-nourished,  in no acute distress.  NECK:  Supple with no carotid bruits.  No lymphadenopathy.  CHEST:  Lungs are clear to auscultation bilaterally.  BREASTS:  Deferred.  HEART:  Regular rate and rhythm without gallops, clicks, rubs, or murmurs.  ABDOMEN:  Soft, nontender, nondistended.  Bowel sounds x4.  GENITOURINARY:  Deferred.  EXTREMITIES:  Pain with flexion.  Internal rotation to 10 degrees.  External  rotation to 30 degrees.  She does have dorsalis pedis pulse positive.  SKIN:  No rashes or skin breakdown.  NEUROLOGIC:  Intact sensibilities.   LABORATORY:  Pending as well as medical clearance.   X-RAY:  AP pelvis and AP and lat of the left hip showed advanced  degenerative changes to the hip. and the chest x-ray.   IMPRESSION:  1. Left hip osteoarthritis.  2. Hypertension.  3. Diverticulitis.  4. Hypothyroidism.  5. Postmenopausal.   PLAN:  Left total hip replacement to be performed by Dr. Durene Romans on  09/24/2006.  Risks and complications were discussed.  We look forward to  treating this very pleasant 77 year old female.     ______________________________  Yetta Glassman. Loreta Ave, [place]      Madlyn Frankel. Charlann Boxer, M.D.  Electronically Signed    BLM/MEDQ  D:  09/09/2006  T:  09/09/2006  Job:  [id number]   cc:   Madlyn Frankel Charlann Boxer, M.D.  Fax: [phone]

## 2011-03-02 NOTE — Discharge Summary (Signed)
Carolyn Henderson, Carolyn Henderson               ACCOUNT NO.:  [id number]   MEDICAL RECORD NO.:  [id number]          PATIENT TYPE:  INP   LOCATION:  1506                         FACILITY:  [facility]   PHYSICIAN:  Madlyn Frankel. Charlann Boxer, M.D.  DATE OF BIRTH:  Mar 10, 1930   DATE OF ADMISSION:  09/24/2006  DATE OF DISCHARGE:  09/27/2006                               DISCHARGE SUMMARY   PROCEDURE:  Left total hip replacement.   ADMITTING DIAGNOSES:  1. Hypertension.  2. Diverticulitis.  3. Hypothyroidism.  4. Osteoarthritis.  5. Post menopausal.   DISCHARGE DIAGNOSES:  1. Hypertension.  2. Diverticulitis.  3. Hypothyroidism.  4. Osteoarthritis.  5. Postmenopausal.  6. Status post left total hip replacement.   CONSULTS:  None.   PROCEDURE:  Left total hip replacement.   SURGEON:  Dr. Durene Romans.   ASSISTANT:  Yetta Glassman. Loreta Ave, PAC.   BRIEF HISTORY OF PRESENT ILLNESS:  This is a 77 year old female with a  history of bilateral hip pain.  She has had a history of hip replacement  in 1999.  Her left hip has continued to give her pain and all  conservative measures have failed.  She was even tried on an  intraarticular injection which helped somewhat for a very short period  of time.  Due to decreased quality of life and inability to participate  in activities of daily living she has elected to go ahead with a left  total hip replacement.  Informed consent and risks and complications  were discussed fully with the patient.   Preadmission labs showed that she had a hemoglobin of 13.6, hematocrit  39.4.  I tracked throughout her course of stay.  Upon discharge she was  stable with a hemoglobin of 10.1 and hematocrit 29.3.  White cell  differentials showed no acute changes.  Preadmission coagulation within  normal limits.  Preadmission chemistries showed a sodium of 132,  potassium 4.6, glucose 83.  I tracked throughout her course of stay.  Sodium 133, potassium 3.8, glucose 101.  Upon discharge  everything was  stable, sodium 138, potassium 4.1, glucose 104.  Preadmission urinalysis  showed a moderate leukoesterase, a few epithelial cells and many  bacteria.   Preadmission chest x-ray showed signs of COPD with hyperinflation of her  lungs.  Preadmission EKG showed borderline ECG.   HOSPITAL COURSE:  Patient tolerated procedure well, was admitted to the  orthopedic floor.  On postop day #1 the patient was doing well, she was  neuromuscular and vascularly intact, the dressing was clean, dry and  intact.  It was reinforced.  She was to be partial weight bearing 50%.  We encouraged Senna juice since her sodium was 133.  Initial evaluation  was done by Physical Therapy, Occupational Therapy and recommendations  were for discharge home with home health care physical therapy.  On  postop day 2 continued to do well, no respiratory distress, dressing was  changed, wound was intact, no ooze.  She was to be partial weight  bearing 50%.  Postop day 3 patient continued to do well, dressing was  changed, no serous ooze  and neuromuscular and vascularly intact and she  was ready to be discharged home.  During her course today she did  experience several bouts of nausea, she was administered Phenergan which  controlled her nausea well.   DISCHARGE DISPOSITION:  Discharged home with home health care and  physical therapy recommended 7 times per week for the first week until  further assessment.   DISCHARGE PHYSICAL THERAPY GOALS:  1. Maximize range of motion.  2. Minimize pain.  3. Improve muscle strength.  4. Promote ambulation.  5. Get retraining.  6. Encourage independence in activities of daily living.   DISCHARGE WOUND CARE:  Keep wound dry, change dressing on a daily basis.   DISCHARGE MEDICATIONS:  1. Synthroid 100 mcg one tablet q.a.m. before eating.  2. Hydrochlorothiazide 25 mg 1/2 tablet every morning.  3. Zyrtec 10 mg one p.o. daily.  4. Acetaminophen 1-2 tablets p.o.  q.6-8 p.r.n.  5. Timolol 0.5% eye drops, one drop in each eye two times daily.  6. Lovenox 40 mg one subcu. q.24 x11 additional days.  7. Norco 5/325 1-2 p.o. q.4-6 p.r.n. pain.  8. Robaxin 500 mg 1-2 p.o. q.4-6 p.r.n. muscle spasm.  9. Colace 100 mg one p.o. b.i.d. p.r.n. constipation.  10.MiraLAX 17 g, one scoop p.o. daily p.r.n. constipation.  11.Iron 325 mg twice a day.   DISCHARGE FOLLOW UP:  With Dr. Charlann Boxer, [phone], within 2 weeks for wound  check.  She can return to the office if her condition worsens.  If she  develops any acute shortness of breath or severe calf pain, she should  call Emergency Services immediately.  I look forward to treating this  very pleasant 77 year old female.     ______________________________  Yetta Glassman. Loreta Ave, [place]      Madlyn Frankel. Charlann Boxer, M.D.  Electronically Signed    BLM/MEDQ  D:  10/18/2006  T:  10/18/2006  Job:  [id number]

## 2011-03-29 ENCOUNTER — Other Ambulatory Visit: Payer: Self-pay | Admitting: Family Medicine

## 2011-04-03 ENCOUNTER — Other Ambulatory Visit: Payer: Self-pay

## 2011-04-03 MED ORDER — LEVOTHYROXINE SODIUM 112 MCG PO TABS: 112.0000 ug | ORAL_TABLET | Freq: Every day | ORAL | Status: DC

## 2011-04-03 NOTE — Telephone Encounter (Signed)
Refill efiled to [facility]

## 2011-05-18 ENCOUNTER — Telehealth: Payer: Self-pay | Admitting: *Deleted

## 2011-05-18 NOTE — Telephone Encounter (Signed)
Appt made for medical clearance.

## 2011-05-18 NOTE — Telephone Encounter (Signed)
Set up appt

## 2011-05-18 NOTE — Telephone Encounter (Signed)
Pt is going to need a medical clearance for a total knee replacement before Setp. 11, 2012, and needs to know if she needs labs, or appt with Dr. Caryl Never?

## 2011-06-13 ENCOUNTER — Other Ambulatory Visit: Payer: Self-pay | Admitting: Orthopedic Surgery

## 2011-06-13 ENCOUNTER — Other Ambulatory Visit (HOSPITAL_COMMUNITY): Payer: Self-pay | Admitting: Orthopedic Surgery

## 2011-06-13 ENCOUNTER — Ambulatory Visit (HOSPITAL_COMMUNITY)
Admission: RE | Admit: 2011-06-13 | Discharge: 2011-06-13 | Disposition: A | Discharge status: Home / Self Care | Payer: Medicare Other | Source: Ambulatory Visit | Attending: Orthopedic Surgery | Admitting: Orthopedic Surgery

## 2011-06-13 ENCOUNTER — Encounter (HOSPITAL_COMMUNITY): Payer: Medicare Other

## 2011-06-13 DIAGNOSIS — I10 Essential (primary) hypertension: Secondary | ICD-10-CM | POA: Insufficient documentation

## 2011-06-13 DIAGNOSIS — Z01810 Encounter for preprocedural cardiovascular examination: Secondary | ICD-10-CM | POA: Insufficient documentation

## 2011-06-13 DIAGNOSIS — Z01818 Encounter for other preprocedural examination: Secondary | ICD-10-CM | POA: Insufficient documentation

## 2011-06-13 DIAGNOSIS — M1710 Unilateral primary osteoarthritis, unspecified knee: Secondary | ICD-10-CM | POA: Insufficient documentation

## 2011-06-13 DIAGNOSIS — Z01812 Encounter for preprocedural laboratory examination: Secondary | ICD-10-CM | POA: Insufficient documentation

## 2011-06-13 LAB — SURGICAL PCR SCREEN: MRSA, PCR: NEGATIVE

## 2011-06-13 LAB — APTT: aPTT: 36 seconds (ref 24–37)

## 2011-06-13 LAB — CBC
MCH: 30.6 pg (ref 26.0–34.0)
MCHC: 33.4 g/dL (ref 30.0–36.0)
Platelets: 276 10*3/uL (ref 150–400)
RDW: 13.7 % (ref 11.5–15.5)

## 2011-06-13 LAB — BASIC METABOLIC PANEL
CO2: 28 mEq/L (ref 19–32)
Calcium: 9.4 mg/dL (ref 8.4–10.5)
Creatinine, Ser: 0.6 mg/dL (ref 0.50–1.10)
Glucose, Bld: 101 mg/dL — ABNORMAL HIGH (ref 70–99)

## 2011-06-13 LAB — URINALYSIS, ROUTINE W REFLEX MICROSCOPIC
Bilirubin Urine: NEGATIVE
Glucose, UA: NEGATIVE mg/dL
Nitrite: POSITIVE — AB
Specific Gravity, Urine: 1.011 (ref 1.005–1.030)
pH: 7 (ref 5.0–8.0)

## 2011-06-13 LAB — DIFFERENTIAL
Basophils Relative: 1 % (ref 0–1)
Eosinophils Absolute: 0.2 10*3/uL (ref 0.0–0.7)
Monocytes Absolute: 0.6 10*3/uL (ref 0.1–1.0)
Monocytes Relative: 10 % (ref 3–12)

## 2011-06-13 LAB — URINE MICROSCOPIC-ADD ON

## 2011-06-13 LAB — PROTIME-INR: Prothrombin Time: 13.3 seconds (ref 11.6–15.2)

## 2011-06-20 ENCOUNTER — Encounter: Payer: Self-pay | Admitting: Family Medicine

## 2011-06-20 ENCOUNTER — Ambulatory Visit (INDEPENDENT_AMBULATORY_CARE_PROVIDER_SITE_OTHER): Payer: Medicare Other | Admitting: Family Medicine

## 2011-06-20 DIAGNOSIS — I10 Essential (primary) hypertension: Secondary | ICD-10-CM

## 2011-06-20 MED ORDER — AMLODIPINE BESYLATE 2.5 MG PO TABS: 2.5000 mg | ORAL_TABLET | Freq: Every day | ORAL | Status: DC

## 2011-06-20 MED ORDER — TRAMADOL HCL 50 MG PO TABS: 50.0000 mg | ORAL_TABLET | Freq: Four times a day (QID) | ORAL | Status: DC | PRN

## 2011-06-20 NOTE — Progress Notes (Signed)
  Subjective:    Patient ID: Carolyn Henderson, female    DOB: July 30, 1930, 77 y.o.   MRN: [id number]  HPI Here for preop evaluation. Planned elective left knee replacement. Her chronic problems include history of hypothyroidism, hyperlipidemia, hypertension, and osteoarthritis. She does not have any history of CAD. Denies any recent chest pains or dyspnea. No cough. Medications reviewed. Blood pressure been well controlled. No history of stroke. No history of peripheral vascular disease. Patient has no history of diabetes and is nonsmoker. She had recent lab work, EKG, and chest x-ray through Hospital and these are reviewed. Chest x-ray no acute finding. Minimally low sodium of 132 otherwise normal.  Past Medical History  Diagnosis Date  . Hypertension   . Hyperlipidemia   . Thyroid disease   . Arthritis    Past Surgical History  Procedure Date  . Cataract extraction   . Total hip arthroplasty   . Abdominal hysterectomy   . Lumbar laminectomy     reports that she has quit smoking. She does not have any smokeless tobacco history on file. She reports that she does not drink alcohol. Her drug history not on file. family history includes Cancer in an unspecified family member and Hypertension in an unspecified family member. Allergies  Allergen Reactions  . Cephalosporins   . Codeine Sulfate   . Ibuprofen   . Penicillins       Review of Systems  Constitutional: Negative for fever, chills and appetite change.  Respiratory: Negative for cough and shortness of breath.   Cardiovascular: Negative for chest pain, palpitations and leg swelling.  Gastrointestinal: Negative for abdominal pain.  Genitourinary: Negative for dysuria.  Neurological: Negative for dizziness and syncope.  Hematological: Does not bruise/bleed easily.       Objective:   Physical Exam  Constitutional: She is oriented to person, place, and time. She appears well-developed and well-nourished.  HENT:  Mouth/Throat:  Oropharynx is clear and moist.  Neck: Neck supple. No thyromegaly present.       No carotid bruits  Cardiovascular: Normal rate and regular rhythm.  Exam reveals no gallop.   Pulmonary/Chest: Effort normal and breath sounds normal. No respiratory distress. She has no wheezes. She has no rales.  Musculoskeletal: She exhibits no edema.  Lymphadenopathy:    She has no cervical adenopathy.  Neurological: She is alert and oriented to person, place, and time. No cranial nerve deficit.          Assessment & Plan:  Preop evaluation. Patient has hypertension well controlled. No history of CAD. Recent lab work, EKG, and chest x-ray reviewed. No contraindications for surgery. Tramadol refilled Hypertension controlled.

## 2011-06-23 NOTE — H&P (Signed)
NAMEAVERIE, HORNBAKER               ACCOUNT NO.:  [id number]  MEDICAL RECORD NO.:  [id number]  LOCATION:                                 FACILITY:  PHYSICIAN:  Madlyn Frankel. Charlann Boxer, M.D.  DATE OF BIRTH:  1930/01/23  DATE OF ADMISSION: DATE OF DISCHARGE:                             HISTORY & PHYSICAL   DATE OF SURGERY:  June 26, 2011.  ADMITTING DIAGNOSIS:  Left knee osteoarthritis.  Note, the patient will be going home after surgery.  Her medical doctor is Dr. Evelena Peat.  Dr. Charlotte Sanes is her ophthalmologist, and Dr. Emily Filbert is her dermatologist.  Note that she is a candidate for tranexamic acid and will receive that in preop holding and she is given her home medications of aspirin, Robaxin, iron, MiraLax, and Colace.  PAST MEDICAL HISTORY:  This is a 77 year old lady with a history of bilateral total hip arthroplasty with good result, who has osteoarthritis of the left knee, which has failed conservative treatment.  After discussion of treatment, benefits, risks, and options, the patient now scheduled for total knee arthroplasty of the left knee.  DRUG ALLERGIES: 1. CODEINE with nausea and vomiting. 2. PENICILLIN with a rash. 3. CEPHALOSPORINS with a rash.  CURRENT MEDICATIONS:  Include, 1. Synthroid 112 mcg once a day. 2. Hydrochlorothiazide 25 mg 1 q.a.m. 3. Zyrtec 10 mg 1 q. evening. 4. Timolol 0.5% eyedrops 1 drop in each eye b.i.d. 5. Protopic ointment for itchy eyelids p.r.n. 6. Clindamycin p.r.n. at dental appointments. 7. Clobetasol 0.05% ointment applied twice a day as needed for     itching. 8. Amlodipine 1 q.h.s. 9. Gabapentin 300 mg one q.h.s. 10.Tramadol 50 mg 1 to 2 q.6 p.r.n. 11.Vitamin B12 one daily. 12.Nortriptyline 50 mg 1 q.h.s. 13.Ibuprofen p.r.n. 14.Tylenol p.r.n. 15.Vitamins.  PREVIOUS SURGERIES:  Include 1. Tonsillectomy. 2. Two intestinal blockages. 3. Cataracts bilaterally. 4. Low back surgery. 5. Right hip replacement. 6. Left hip  replacement.  SERIOUS MEDICAL ILLNESSES:  Include 1. Hypothyroidism. 2. Hypertension. 3. Glaucoma. 4. Chronic pain.  FAMILY HISTORY:  Positive for prostate disease, dementia, CVA, alcoholism, and cancer.  SOCIAL HISTORY:  Patient is married.  She is retired.  She lives at home.  She does not smoke and does not drink.  REVIEW OF SYSTEMS:  CENTRAL NERVOUS SYSTEM:  Positive for macular degeneration, glaucoma, cataracts, and a history of shingles. PULMONARY:  Negative for shortness breath, PND, or orthopnea. CARDIOVASCULAR:  Negative for chest pain or palpitation.  GI:  Negative for ulcers or hepatitis.  GU:  Positive for occasional urinary incontinence.  MUSCULOSKELETAL:  Positive as in the HPI.  PHYSICAL EXAMINATION:  VITAL SIGNS:  BP 122/78, respirations 16, pulse 74 and regular. GENERAL APPEARANCE:  This is a well-developed, well-nourished lady, in no acute distress. HEENT:  Head normocephalic.  Nose patent.  Ears patent.  Pupils are equal, round, and reactive to light.  Throat without injection. NECK:  Supple without adenopathy.  Carotids 2+ without bruit. CHEST:  Clear to auscultation.  No rales or rhonchi.  Respirations 16. HEART:  Regular rate and rhythm at 74 beats per minute without murmur. ABDOMEN:  Soft.  Active bowel sounds.  No masses, organomegaly. NEUROLOGIC:  The patient is alert and oriented to time, place, and person.  Cranial nerves II through XII grossly intact. EXTREMITIES:  Show left knee with a valgus deformity, 3-degree flexion traction with further flexion to 115 degrees.  Neurovascular status is intact.  IMPRESSION:  Left knee osteoarthritis.  PLAN:  Left total knee arthroplasty.     Jaquelyn Bitter. Chabon, P.A.   ______________________________ Madlyn Frankel Charlann Boxer, M.D.    SJC/MEDQ  D:  06/13/2011  T:  06/14/2011  Job:  [id number]  Electronically Signed by Jodene Nam P.A. on 06/19/2011 09:26:20 AM Electronically Signed by Durene Romans M.D. on  06/23/2011 07:15:13 AM

## 2011-06-26 ENCOUNTER — Inpatient Hospital Stay (HOSPITAL_COMMUNITY)
Admission: RE | Admit: 2011-06-26 | Discharge: 2011-06-28 | DRG: 470 | Disposition: A | Discharge status: Home Health | Payer: Medicare Other | Source: Ambulatory Visit | Attending: Orthopedic Surgery | Admitting: Orthopedic Surgery

## 2011-06-26 DIAGNOSIS — Z79899 Other long term (current) drug therapy: Secondary | ICD-10-CM

## 2011-06-26 DIAGNOSIS — Z01812 Encounter for preprocedural laboratory examination: Secondary | ICD-10-CM

## 2011-06-26 DIAGNOSIS — G8929 Other chronic pain: Secondary | ICD-10-CM | POA: Diagnosis present

## 2011-06-26 DIAGNOSIS — E039 Hypothyroidism, unspecified: Secondary | ICD-10-CM | POA: Diagnosis present

## 2011-06-26 DIAGNOSIS — H409 Unspecified glaucoma: Secondary | ICD-10-CM | POA: Diagnosis present

## 2011-06-26 DIAGNOSIS — B029 Zoster without complications: Secondary | ICD-10-CM | POA: Diagnosis present

## 2011-06-26 DIAGNOSIS — Z880 Allergy status to penicillin: Secondary | ICD-10-CM

## 2011-06-26 DIAGNOSIS — Z96649 Presence of unspecified artificial hip joint: Secondary | ICD-10-CM

## 2011-06-26 DIAGNOSIS — M1710 Unilateral primary osteoarthritis, unspecified knee: Principal | ICD-10-CM | POA: Diagnosis present

## 2011-06-26 DIAGNOSIS — I10 Essential (primary) hypertension: Secondary | ICD-10-CM | POA: Diagnosis present

## 2011-06-26 DIAGNOSIS — Z01810 Encounter for preprocedural cardiovascular examination: Secondary | ICD-10-CM

## 2011-06-27 LAB — BASIC METABOLIC PANEL
BUN: 4 mg/dL — ABNORMAL LOW (ref 6–23)
Calcium: 8.5 mg/dL (ref 8.4–10.5)
Creatinine, Ser: <0.47 mg/dL — ABNORMAL LOW (ref 0.50–1.10)
Glucose, Bld: 89 mg/dL (ref 70–99)

## 2011-06-27 LAB — CBC
HCT: 34.6 % — ABNORMAL LOW (ref 36.0–46.0)
Hemoglobin: 11.1 g/dL — ABNORMAL LOW (ref 12.0–15.0)
MCH: 30.1 pg (ref 26.0–34.0)
MCHC: 32.1 g/dL (ref 30.0–36.0)
RDW: 13.8 % (ref 11.5–15.5)

## 2011-06-28 LAB — CBC
MCH: 29.7 pg (ref 26.0–34.0)
MCHC: 32 g/dL (ref 30.0–36.0)
MCV: 92.9 fL (ref 78.0–100.0)
Platelets: 279 10*3/uL (ref 150–400)
RBC: 3.8 MIL/uL — ABNORMAL LOW (ref 3.87–5.11)
RDW: 13.6 % (ref 11.5–15.5)

## 2011-06-28 LAB — BASIC METABOLIC PANEL
Calcium: 8.9 mg/dL (ref 8.4–10.5)
Creatinine, Ser: 0.54 mg/dL (ref 0.50–1.10)
GFR calc Af Amer: >60 mL/min (ref >60)
GFR calc non Af Amer: >60 mL/min (ref >60)
Sodium: 131 mEq/L — ABNORMAL LOW (ref 135–145)

## 2011-06-29 NOTE — Op Note (Signed)
Henderson, [name] NO.:  [id number]  MEDICAL RECORD NO.:  [id number]  LOCATION:  0003                         FACILITY:  [facility]  PHYSICIAN:  Madlyn Frankel. Charlann Boxer, M.D.  DATE OF BIRTH:  1930-10-01  DATE OF PROCEDURE:  06/26/2011 DATE OF DISCHARGE:                              OPERATIVE REPORT   PREOPERATIVE DIAGNOSIS:  Left knee osteoarthritis.  POSTOPERATIVE DIAGNOSIS:  Left knee osteoarthritis.  PROCEDURE:  Left total knee replacement utilizing DePuy component size 4 narrow femur, 3 tibia, 12.5 insert to match the 4 femur, 38 patellar button.  SURGEON:  Madlyn Frankel. Charlann Boxer, MD  ASSISTANT:  Jaquelyn Bitter. Chabon, PA-C  ANESTHESIA:  Spinal.  SPECIMENS:  None.  COMPLICATIONS:  None.  DRAINS:  One Hemovac.  TOURNIQUET TIME:  29 minutes at 250 mmHg.  BLOOD LOSS:  Minimal.  INDICATIONS OF PROCEDURE:  Ms. Wonder is a 77 year old female who presented to the office for management of left knee pain.  She had failed conservative measures, had repeated injections.  Radiographs revealed more degenerative changes laterally.  After failing respond to conservative measures, we discussed proceeding with more definitive measures and risks and benefits of knee replacement surgery discussed, including infection, DVT, component failure.  After reviewing these risks, she was ready to proceed.  Consent was obtained for benefit of pain relief.  PROCEDURE IN DETAIL:  The patient was brought to operative theater. Once adequate anesthesia, preoperative antibiotics, clindamycin administered, she was positioned supine.  Left thigh tourniquet was placed, left lower extremity was then prepped and draped in sterile fashion.  The left foot placed in [facility] leg holder.  Time-out was performed identifying the patient, planned procedure, and extremity.  The leg was then exsanguinated, tourniquet elevated to 250 mmHg.  Midline incision was made followed by median arthrotomy.   Following initial exposure, attention was first directed to patella.  Precut measurement was 24 mm.  I resected down to about 14 mm. I used 38 patellar button to restore height.  Lug holes were drilled and a metal shim placed to protect the patella from retractors and saw blades.  Following further debridement and exposure, attention was directed to femur.  Femoral canal was opened with drill, irrigated to prevent fat emboli.  An intramedullary rod was passed at 3 degrees of valgus, 10 mm of bone was resected off the distal femur. Following this resection, the tibia subluxated anteriorly.  Using extramedullary guide, an 8 mm resection was based off the lateral tibia based on the radiographs which revealed relatively neutral tibial plateau, most of the degenerative changes were lateral.  Following this resection, we confirmed the medial and lateral collateral wounds were stable in extension with 10 mm insert.  I then confirmed the cut was perpendicular in the coronal plane. Following these findings, I sized the femur to be a size 4 in the anterior-posterior dimension.  The size 4 rotation block was then pinned into position anterior reference using the C-clamp to set rotation of the proximal tibial cut.  The 4:1 cutting block was pinned in position.  The anterior and posterior chamfer cuts made without difficulty nor notching.  Final box cut was made using a size  3 tibia off the lateral aspect of distal femur.  The tibia was then prepared for a size 3 tibial tray which was pinned into position, drilled, and keel punched.  Trial reduction was carried out with a 4 narrow femur, 3 tibia, and a 12.5 insert.  With this, the knee came to full extension, stable from extension to flexion.  The patella tracked through the trochlea without application of pressure. Given all these findings, the final components were opened.  Trial components were removed.  The knee was injected with 0.25%  Marcaine with epinephrine and 1 cc of Toradol.  The knee was irrigated with normal saline solution.  Following replenishment of cement onto clean and dried cut surface of the bone, the knee was brought to extension with 12.5 insert and extruded cement was removed.  Once the cement had fully cured and excessive cement was removed throughout the knee, the final 12.5 insert to match the 4 femur was chosen and placed into the knee.  Medium Hemovac drain was placed deep and tourniquet had been let down at 29 minutes without significant hemostasis required.  I re-irrigated the knee out with normal saline solution.  The extensor mechanism was then reapproximated using #1 Vicryl and knee in flexion.  The remainder of the wound was closed with 2-0 Vicryl and running 4-0 Monocryl.  The knee was cleaned, dried, and dressed sterilely using Dermabond and Aquacel dressing. Drain site dressed separately.     Madlyn Frankel Charlann Boxer, M.D.     MDO/MEDQ  D:  06/26/2011  T:  06/26/2011  Job:  [id number]  Electronically Signed by Durene Romans M.D. on 06/29/2011 07:29:59 AM

## 2011-06-30 LAB — TYPE AND SCREEN
ABO/RH(D): A POS
Antibody Screen: POSITIVE
Unit division: 0

## 2011-07-02 NOTE — Op Note (Signed)
  NAMEJOVONNE, WILTON NO.:  [id number]  MEDICAL RECORD NO.:  [id number]  LOCATION:  1610                         FACILITY:  [facility]  PHYSICIAN:  Madlyn Frankel. Charlann Boxer, M.D.  DATE OF BIRTH:  12/08/1929  DATE OF PROCEDURE: DATE OF DISCHARGE:  06/28/2011                              OPERATIVE REPORT   ADDENDUM:  Physician assistant was utilized for the entire portion of the case from preoperative positioning, perioperative positioning, retractor management, and general facilitation of the case, also direct assistance with wound closure.     Madlyn Frankel Charlann Boxer, M.D.     MDO/MEDQ  D:  06/29/2011  T:  06/30/2011  Job:  [id number]  Electronically Signed by Durene Romans M.D. on 07/02/2011 09:45:06 AM

## 2011-07-02 NOTE — Discharge Summary (Signed)
Carolyn Henderson, MOSEY NO.:  [id number]  MEDICAL RECORD NO.:  [id number]  LOCATION:  1610                         FACILITY:  [facility]  PHYSICIAN:  Madlyn Frankel. Charlann Boxer, M.D.  DATE OF BIRTH:  1930/03/04  DATE OF ADMISSION:  06/26/2011 DATE OF DISCHARGE:  06/28/2011                              DISCHARGE SUMMARY   Patient of Dr. Nilsa Nutting.  PROCEDURE:  Left total knee arthroplasty.  ADMITTING DIAGNOSIS:  Left knee osteoarthritis.  DISCHARGE DIAGNOSES: 1. Status post left total knee arthroplasty. 2. Hypothyroidism. 3. Hypertension. 4. Glaucoma. 5. Chronic pain.  HISTORY OF PRESENT ILLNESS:  The patient is a 81-year lady with history of bilateral total hip arthroplasties with good results who has osteoarthritis of the left knee. Multiple conservative treatments have been tried, all of which she failed conservative symptoms.  X-rays did show arthritic changes of her left knee.  Various options were discussed with the patient and the patient wished to proceed with surgery.  Risks, benefits, and expectations of procedure were discussed with the patient and the patient understood the risks, benefits, and expectations and wished to proceed with surgery.  HOSPITAL COURSE:  The patient underwent the above-stated procedure on June 26, 2011.  The patient tolerated the procedure well, was brought to the recovery room in good condition and subsequently to the floor.  Postop day #1, June 27, 2011, the patient doing well, no events, afebrile, vital signs stable, hematocrit was 34.6.  Hemovac was taken out.  The patient had physical therapy.  Postop day #2, June 28, 2011, the patient doing well, no events. The patient's pain well controlled, afebrile, vital signs stable. Hemoglobin and hematocrit is 11.3/35.3.  Dressings looked good, clean, dry, and intact.  Distally neurovascularly intact.  The patient had physical therapy.  The patient was found well enough to  be discharge to home with home health.  DISCHARGE CONDITION:  Good.  DISCHARGE INSTRUCTIONS:  The patient will be discharged home on June 28, 2011.  The patient will be discharged with home health PT. She will be weightbearing as tolerated.  The patient will maintain her surgical dressing for about 8 days after which time she will replace with gauze and tape.  The patient is to keep the area dry and clean until followup.  She will follow up in [facility] in 2 weeks.  The patient is to call with any questions or concerns.  DISCHARGE MEDICATIONS: 1. Aspirin enteric coated 325 mg 1 p.o. b.i.d. for 4 weeks. 2. Benadryl 25 mg 1 p.o. q.4 h. p.r.n. 3. Colace 100 mg 1 p.o. b.i.d. constipation. 4. Iron sulfate 325 mg 1 p.o. t.i.d. for 2 to 3 weeks. 5. Norco 7.5/325 one to two p.o. q.4-6 h. p.r.n. pain. 6. Robaxin 500 mg 1 p.o. q.6 h. p.r.n. muscle spasm. 7. MiraLax 17 g p.o. daily constipation. 8. Amlodipine 2.5 mg 1 p.o. nightly. 9. Artificial tears 1 drop both eyes 5 times a day as needed. 10.Calcium/vitamin D 600 mg 1 p.o. b.i.d. 11.Celluvisc eye lubricant 1% one application both eyes nightly. 12.Claritin OTC 1 tablet every morning. 13.Clobetasol 0.05% one application topically b.i.d. p.r.n. itching. 14.Fish oil 1200 mg 1 p.o. b.i.d. 15.Gabapentin 300  mg 1 p.o. q.a.m. 16.Glucosamine 1 capsule b.i.d. 17.Hydrochlorothiazide 25 mg 1 p.o. q.a.m. 18.Nortriptyline 50 mg 1 p.o. nightly. 19.Ocuvite 1 tablet p.o. daily. 20.Protopic 1 application topically daily as needed. 21.Synthroid 112 mcg 1 p.o. q.a.m. 22.Timolol ophthalmic solution 0.5% 1 drop both eyes twice daily. 23.Vitamin B12 over the counter 1 p.o. daily. 24.Vitamin D over the counter 1 p.o. daily. 25.Zyrtec 10 mg 1 p.o. nightly.    ______________________________ Lanney Gins, PA   ______________________________ Madlyn Frankel. Charlann Boxer, M.D.    MB/MEDQ  D:  06/28/2011  T:  06/28/2011  Job:   [id number]  Electronically Signed by Lanney Gins PA on 06/29/2011 09:05:14 AM Electronically Signed by Durene Romans M.D. on 07/02/2011 09:45:02 AM

## 2011-07-10 ENCOUNTER — Other Ambulatory Visit: Payer: Self-pay | Admitting: Family Medicine

## 2011-07-30 ENCOUNTER — Ambulatory Visit: Payer: Medicare Other | Attending: Orthopedic Surgery | Admitting: Physical Therapy

## 2011-07-30 DIAGNOSIS — IMO0001 Reserved for inherently not codable concepts without codable children: Secondary | ICD-10-CM | POA: Insufficient documentation

## 2011-07-30 DIAGNOSIS — R5381 Other malaise: Secondary | ICD-10-CM | POA: Insufficient documentation

## 2011-07-30 DIAGNOSIS — M25669 Stiffness of unspecified knee, not elsewhere classified: Secondary | ICD-10-CM | POA: Insufficient documentation

## 2011-07-30 DIAGNOSIS — Z96659 Presence of unspecified artificial knee joint: Secondary | ICD-10-CM | POA: Insufficient documentation

## 2011-07-30 DIAGNOSIS — M25569 Pain in unspecified knee: Secondary | ICD-10-CM | POA: Insufficient documentation

## 2011-07-30 DIAGNOSIS — R262 Difficulty in walking, not elsewhere classified: Secondary | ICD-10-CM | POA: Insufficient documentation

## 2011-08-01 ENCOUNTER — Ambulatory Visit: Payer: Medicare Other | Admitting: Physical Therapy

## 2011-08-03 ENCOUNTER — Ambulatory Visit: Payer: Medicare Other | Admitting: *Deleted

## 2011-08-06 ENCOUNTER — Ambulatory Visit: Payer: Medicare Other | Admitting: Physical Therapy

## 2011-08-08 ENCOUNTER — Ambulatory Visit: Payer: Medicare Other | Admitting: Physical Therapy

## 2011-08-09 ENCOUNTER — Ambulatory Visit: Payer: Medicare Other | Admitting: Physical Therapy

## 2011-08-10 ENCOUNTER — Ambulatory Visit (INDEPENDENT_AMBULATORY_CARE_PROVIDER_SITE_OTHER): Payer: Medicare Other | Admitting: Family Medicine

## 2011-08-10 ENCOUNTER — Encounter: Payer: Self-pay | Admitting: Family Medicine

## 2011-08-10 DIAGNOSIS — E039 Hypothyroidism, unspecified: Secondary | ICD-10-CM

## 2011-08-10 DIAGNOSIS — L299 Pruritus, unspecified: Secondary | ICD-10-CM

## 2011-08-10 DIAGNOSIS — Z23 Encounter for immunization: Secondary | ICD-10-CM

## 2011-08-10 DIAGNOSIS — B0229 Other postherpetic nervous system involvement: Secondary | ICD-10-CM

## 2011-08-10 DIAGNOSIS — I10 Essential (primary) hypertension: Secondary | ICD-10-CM

## 2011-08-10 NOTE — Progress Notes (Signed)
  Subjective:    Patient ID: Carolyn Henderson, female    DOB: [birth year], 77 y.o.   MRN: [id number]  HPI  Medical followup. Recent left total knee replacement. Good recovery. Much less pain. No complications from surgery. History hypothyroidism. Compliant with therapy. TSH normal last April. Recheck in 6 months  New problem of some diffuse itching. No significant rash. Recently stopped hydrocodone per orthopedist. Still using tramadol and muscle relaxer off and on. Has tried antihistamines with mild relief.  Hypertension treated with HCTZ and low-dose Norvasc. Blood pressure stable. No orthostasis. Minimal left lower extremity edema following surgery. No chest pain  Review of Systems  Constitutional: Negative for fatigue.  Eyes: Negative for visual disturbance.  Respiratory: Negative for cough, chest tightness, shortness of breath and wheezing.   Cardiovascular: Positive for leg swelling. Negative for chest pain and palpitations.  Musculoskeletal: Negative for gait problem.  Neurological: Negative for dizziness, seizures, syncope, weakness, light-headedness and headaches.  Psychiatric/Behavioral: Negative for dysphoric mood.       Objective:   Physical Exam  Constitutional: She is oriented to person, place, and time. She appears well-developed and well-nourished. No distress.  Neck: Neck supple. No thyromegaly present.  Cardiovascular: Normal rate, regular rhythm and normal heart sounds.   Pulmonary/Chest: Effort normal and breath sounds normal. No respiratory distress. She has no wheezes. She has no rales.  Musculoskeletal:       L knee scar from surgery.  No signs of infection.  Lymphadenopathy:    She has no cervical adenopathy.  Neurological: She is alert and oriented to person, place, and time.          Assessment & Plan:  #1 hypertension stable continue current medications #2 hypothyroidism. Recheck TSH at followup in 6 months  #3 generalized pruritus. No visible rash.  Continue antihistamines. Consider Aveeno soap #4 recent left total knee replacement. Taper off tramadol as tolerated. #5 Postherpetic neuralgia.  Mild pain at this time around 3/10 and tolerating with gabapentin and low dose nortriptyline at night.

## 2011-08-13 ENCOUNTER — Other Ambulatory Visit: Payer: Self-pay | Admitting: Family Medicine

## 2011-08-14 ENCOUNTER — Ambulatory Visit: Payer: Medicare Other | Admitting: Physical Therapy

## 2011-08-16 ENCOUNTER — Ambulatory Visit: Payer: Medicare Other | Attending: Orthopedic Surgery | Admitting: Physical Therapy

## 2011-08-16 DIAGNOSIS — R262 Difficulty in walking, not elsewhere classified: Secondary | ICD-10-CM | POA: Insufficient documentation

## 2011-08-16 DIAGNOSIS — M25569 Pain in unspecified knee: Secondary | ICD-10-CM | POA: Insufficient documentation

## 2011-08-16 DIAGNOSIS — R5381 Other malaise: Secondary | ICD-10-CM | POA: Insufficient documentation

## 2011-08-16 DIAGNOSIS — IMO0001 Reserved for inherently not codable concepts without codable children: Secondary | ICD-10-CM | POA: Insufficient documentation

## 2011-08-16 DIAGNOSIS — M25669 Stiffness of unspecified knee, not elsewhere classified: Secondary | ICD-10-CM | POA: Insufficient documentation

## 2011-08-16 DIAGNOSIS — Z96659 Presence of unspecified artificial knee joint: Secondary | ICD-10-CM | POA: Insufficient documentation

## 2011-08-21 ENCOUNTER — Ambulatory Visit: Payer: Medicare Other | Admitting: *Deleted

## 2011-08-23 ENCOUNTER — Ambulatory Visit: Payer: Medicare Other | Admitting: *Deleted

## 2011-08-27 ENCOUNTER — Ambulatory Visit: Payer: Medicare Other | Admitting: Physical Therapy

## 2011-08-30 ENCOUNTER — Ambulatory Visit: Payer: Medicare Other | Admitting: Physical Therapy

## 2011-09-03 ENCOUNTER — Ambulatory Visit: Payer: Medicare Other | Admitting: Physical Therapy

## 2011-09-11 ENCOUNTER — Ambulatory Visit: Payer: Medicare Other | Admitting: Physical Therapy

## 2011-09-13 ENCOUNTER — Ambulatory Visit: Payer: Medicare Other | Admitting: Physical Therapy

## 2011-09-17 ENCOUNTER — Ambulatory Visit: Payer: Medicare Other | Attending: Orthopedic Surgery | Admitting: Physical Therapy

## 2011-09-17 DIAGNOSIS — Z96659 Presence of unspecified artificial knee joint: Secondary | ICD-10-CM | POA: Insufficient documentation

## 2011-09-17 DIAGNOSIS — R5381 Other malaise: Secondary | ICD-10-CM | POA: Insufficient documentation

## 2011-09-17 DIAGNOSIS — R262 Difficulty in walking, not elsewhere classified: Secondary | ICD-10-CM | POA: Insufficient documentation

## 2011-09-17 DIAGNOSIS — M25569 Pain in unspecified knee: Secondary | ICD-10-CM | POA: Insufficient documentation

## 2011-09-17 DIAGNOSIS — M25669 Stiffness of unspecified knee, not elsewhere classified: Secondary | ICD-10-CM | POA: Insufficient documentation

## 2011-09-17 DIAGNOSIS — IMO0001 Reserved for inherently not codable concepts without codable children: Secondary | ICD-10-CM | POA: Insufficient documentation

## 2011-09-19 ENCOUNTER — Ambulatory Visit: Payer: Medicare Other | Admitting: Physical Therapy

## 2011-09-25 ENCOUNTER — Encounter: Payer: Medicare Other | Admitting: Physical Therapy

## 2011-09-28 ENCOUNTER — Other Ambulatory Visit: Payer: Self-pay | Admitting: Family Medicine

## 2011-11-06 DIAGNOSIS — H353190 Nonexudative age-related macular degeneration, unspecified eye, stage unspecified: Secondary | ICD-10-CM | POA: Diagnosis not present

## 2011-11-06 DIAGNOSIS — H43819 Vitreous degeneration, unspecified eye: Secondary | ICD-10-CM | POA: Diagnosis not present

## 2011-11-28 DIAGNOSIS — L819 Disorder of pigmentation, unspecified: Secondary | ICD-10-CM | POA: Diagnosis not present

## 2011-11-28 DIAGNOSIS — L821 Other seborrheic keratosis: Secondary | ICD-10-CM | POA: Diagnosis not present

## 2011-11-29 ENCOUNTER — Encounter: Payer: Self-pay | Admitting: Family Medicine

## 2011-11-29 ENCOUNTER — Ambulatory Visit (INDEPENDENT_AMBULATORY_CARE_PROVIDER_SITE_OTHER): Payer: Medicare Other | Admitting: Family Medicine

## 2011-11-29 VITALS — BP 120/72 | Temp 98.2°F | Wt 158.0 lb

## 2011-11-29 DIAGNOSIS — H6640 Suppurative otitis media, unspecified, unspecified ear: Secondary | ICD-10-CM

## 2011-11-29 DIAGNOSIS — H6642 Suppurative otitis media, unspecified, left ear: Secondary | ICD-10-CM

## 2011-11-29 MED ORDER — LEVOFLOXACIN 500 MG PO TABS: 500.0000 mg | ORAL_TABLET | Freq: Every day | ORAL | Status: AC

## 2011-11-29 NOTE — Patient Instructions (Signed)

## 2011-11-29 NOTE — Progress Notes (Signed)
  Subjective:    Patient ID: Carolyn Henderson, female    DOB: December 04, 1929, 78 y.o.   MRN: [id number]  HPI  Acute visit. 2 weeks ago onset of cold-like symptoms. Nasal congestion and dry cough initially. Cough now productive of yellow sputum. Denies any fever or chills. No headaches. Yellowish to brownish nasal discharge. Last night developed some bloody drainage left ear canal. History of frequent otitis in childhood. Slightly decreased hearing left ear. Mild to moderate left ear pain. She has allergies to multiple antibiotics including penicillin and cephalosporins. Nonsmoker   Review of Systems  Constitutional: Negative for fever and chills.  HENT: Positive for ear pain, congestion, sinus pressure and ear discharge. Negative for neck pain and tinnitus.   Respiratory: Positive for cough. Negative for shortness of breath and wheezing.   Neurological: Negative for headaches.       Objective:   Physical Exam  Constitutional: She appears well-developed and well-nourished.  HENT:       Right tympanic membrane reveals some scarring. Left is extremely erythematous with some periodic secretions in the canal but no visible perforation. Distorted landmarks  Neck: Neck supple.  Cardiovascular: Normal rate and regular rhythm.   Pulmonary/Chest: Effort normal and breath sounds normal. No respiratory distress. She has no wheezes. She has no rales.  Lymphadenopathy:    She has no cervical adenopathy.          Assessment & Plan:  Recent viral URI with secondary suppurative left otitis media. Levaquin 500 milligrams daily for 10 days. Keep ear canal dry. Reassess 2 weeks

## 2011-12-13 ENCOUNTER — Encounter: Payer: Self-pay | Admitting: Family Medicine

## 2011-12-13 ENCOUNTER — Ambulatory Visit (INDEPENDENT_AMBULATORY_CARE_PROVIDER_SITE_OTHER): Payer: Medicare Other | Admitting: Family Medicine

## 2011-12-13 VITALS — BP 120/72 | Temp 97.5°F | Wt 157.0 lb

## 2011-12-13 DIAGNOSIS — H6640 Suppurative otitis media, unspecified, unspecified ear: Secondary | ICD-10-CM

## 2011-12-13 NOTE — Progress Notes (Signed)
  Subjective:    Patient ID: Carolyn Henderson, female    DOB: [birth year], 78 y.o.   MRN: [id number]  HPI  Followup recent suppurative otitis media. Treat with Levaquin. Left ear draining after couple days. She subsequently had some drainage which was purulent from the right ear. She has a little muffled hearing no ear pain and no drainage at this time. No fever or chills. Denies any cough.   Review of Systems  Constitutional: Negative for fever and chills.  HENT: Negative for hearing loss and ear pain.        Objective:   Physical Exam  Constitutional: She appears well-developed and well-nourished.  HENT:       She has a very small approximately 1 mm perforation posterior aspect right eardrum. No erythema. No pus like drainage left eardrum reveals small eschar or clotted blood along the anterior aspect of eardrum. Erythema and bulging have resolved from last visit. Both ear canals are normal.  Cardiovascular: Normal rate and regular rhythm.   Pulmonary/Chest: Effort normal and breath sounds normal.          Assessment & Plan:  Recent suppurative otitis media improved and resolving following antibiotics. Small persistent perforation right TM. Patient instructed to keep ears dry. Reassess at followup in April

## 2011-12-13 NOTE — Patient Instructions (Signed)
Continue to keep ears dry.

## 2011-12-19 DIAGNOSIS — H53009 Unspecified amblyopia, unspecified eye: Secondary | ICD-10-CM | POA: Diagnosis not present

## 2011-12-19 DIAGNOSIS — H40019 Open angle with borderline findings, low risk, unspecified eye: Secondary | ICD-10-CM | POA: Diagnosis not present

## 2011-12-19 DIAGNOSIS — Z961 Presence of intraocular lens: Secondary | ICD-10-CM | POA: Diagnosis not present

## 2012-02-07 ENCOUNTER — Encounter: Payer: Self-pay | Admitting: Family Medicine

## 2012-02-07 ENCOUNTER — Ambulatory Visit (INDEPENDENT_AMBULATORY_CARE_PROVIDER_SITE_OTHER): Payer: Medicare Other | Admitting: Family Medicine

## 2012-02-07 VITALS — BP 140/88 | Temp 97.7°F | Wt 157.0 lb

## 2012-02-07 DIAGNOSIS — M949 Disorder of cartilage, unspecified: Secondary | ICD-10-CM | POA: Diagnosis not present

## 2012-02-07 DIAGNOSIS — E785 Hyperlipidemia, unspecified: Secondary | ICD-10-CM

## 2012-02-07 DIAGNOSIS — B0229 Other postherpetic nervous system involvement: Secondary | ICD-10-CM

## 2012-02-07 DIAGNOSIS — M899 Disorder of bone, unspecified: Secondary | ICD-10-CM

## 2012-02-07 DIAGNOSIS — E039 Hypothyroidism, unspecified: Secondary | ICD-10-CM | POA: Diagnosis not present

## 2012-02-07 DIAGNOSIS — M8580 Other specified disorders of bone density and structure, unspecified site: Secondary | ICD-10-CM | POA: Insufficient documentation

## 2012-02-07 DIAGNOSIS — I10 Essential (primary) hypertension: Secondary | ICD-10-CM

## 2012-02-07 LAB — BASIC METABOLIC PANEL
Calcium: 9.3 mg/dL (ref 8.4–10.5)
GFR: 118.02 mL/min (ref >60.00)
Potassium: 4.1 mEq/L (ref 3.5–5.1)
Sodium: 135 mEq/L (ref 135–145)

## 2012-02-07 LAB — TSH: TSH: 3.29 u[IU]/mL (ref 0.35–5.50)

## 2012-02-07 MED ORDER — AMLODIPINE BESYLATE 2.5 MG PO TABS: 2.5000 mg | ORAL_TABLET | Freq: Every day | ORAL | Status: DC

## 2012-02-07 NOTE — Progress Notes (Signed)
  Subjective:    Patient ID: Carolyn Henderson, female    DOB: 11-07-1929, 78 y.o.   MRN: [id number]  HPI  Medical followup. Patient has history of hypertension, hyperlipidemia, previous shingles with chronic postherpetic neuralgia pain, osteoarthritis, and hypothyroidism.   Compliant with medication. No side effects. Hypertension treated with HCTZ and amlodipine. Blood pressures have generally been well-controlled. No headaches. No dizziness. No recent chest pain.  History osteopenia. No DEXA scan since 2006. She does take calcium and vitamin D. Exercising regularly.  Postherpetic neuralgia pain left thoracic region. She remains on gabapentin. Pain fairly well controlled with that.  Past Medical History  Diagnosis Date  . Hypertension   . Hyperlipidemia   . Thyroid disease   . Arthritis    Past Surgical History  Procedure Date  . Cataract extraction   . Total hip arthroplasty   . Abdominal hysterectomy   . Lumbar laminectomy     reports that she has quit smoking. She does not have any smokeless tobacco history on file. She reports that she does not drink alcohol. Her drug history not on file. family history includes Cancer in an unspecified family member and Hypertension in an unspecified family member. Allergies  Allergen Reactions  . Cephalosporins   . Codeine Sulfate   . Hydrocodone     Sores in mouth, itiching  . Penicillins   '    Review of Systems  Constitutional: Negative for appetite change, fatigue and unexpected weight change.  Eyes: Negative for visual disturbance.  Respiratory: Negative for cough, chest tightness, shortness of breath and wheezing.   Cardiovascular: Negative for chest pain, palpitations and leg swelling.  Gastrointestinal: Negative for abdominal pain.  Genitourinary: Negative for dysuria.  Neurological: Negative for dizziness, seizures, syncope, weakness, light-headedness and headaches.  Hematological: Negative for adenopathy.    Psychiatric/Behavioral: Negative for confusion.       Objective:   Physical Exam  Constitutional: She is oriented to person, place, and time. She appears well-developed and well-nourished. No distress.  HENT:  Mouth/Throat: Oropharynx is clear and moist.  Neck: Neck supple. No thyromegaly present.  Cardiovascular: Normal rate and regular rhythm.   Pulmonary/Chest: Breath sounds normal. No respiratory distress. She has no wheezes. She has no rales.  Musculoskeletal: She exhibits no edema.  Lymphadenopathy:    She has no cervical adenopathy.  Neurological: She is alert and oriented to person, place, and time.          Assessment & Plan:  #1 hypothyroidism. Recheck TSH #2 hypertension. Marginal control. Continue regular exercise and recommend monitoring at least once per week. Check basic metabolic panel #3 history of osteopenia. Check DEXA scan with her followup mammogram this summer. #4 postherpetic neuropathy stable on gabapentin.

## 2012-02-08 NOTE — Progress Notes (Signed)
Quick Note:  Pt informed on home VM ______ 

## 2012-02-11 ENCOUNTER — Other Ambulatory Visit: Payer: Self-pay | Admitting: Family Medicine

## 2012-02-11 DIAGNOSIS — Z1231 Encounter for screening mammogram for malignant neoplasm of breast: Secondary | ICD-10-CM

## 2012-03-14 ENCOUNTER — Other Ambulatory Visit: Payer: Self-pay | Admitting: Family Medicine

## 2012-03-18 ENCOUNTER — Ambulatory Visit
Admission: RE | Admit: 2012-03-18 | Discharge: 2012-03-18 | Disposition: A | Discharge status: Home / Self Care | Payer: Medicare Other | Source: Ambulatory Visit | Attending: Family Medicine | Admitting: Family Medicine

## 2012-03-18 DIAGNOSIS — Z1231 Encounter for screening mammogram for malignant neoplasm of breast: Secondary | ICD-10-CM

## 2012-03-18 DIAGNOSIS — M8580 Other specified disorders of bone density and structure, unspecified site: Secondary | ICD-10-CM

## 2012-03-18 DIAGNOSIS — Z13820 Encounter for screening for osteoporosis: Secondary | ICD-10-CM | POA: Diagnosis not present

## 2012-03-18 DIAGNOSIS — Z780 Asymptomatic menopausal state: Secondary | ICD-10-CM | POA: Diagnosis not present

## 2012-03-19 ENCOUNTER — Other Ambulatory Visit: Payer: Self-pay | Admitting: Family Medicine

## 2012-03-19 NOTE — Progress Notes (Signed)
Quick Note:  Pt informed ______ 

## 2012-03-29 ENCOUNTER — Other Ambulatory Visit: Payer: Self-pay | Admitting: Family Medicine

## 2012-04-02 ENCOUNTER — Encounter: Payer: Self-pay | Admitting: Family Medicine

## 2012-04-23 DIAGNOSIS — T84059A Periprosthetic osteolysis of unspecified internal prosthetic joint, initial encounter: Secondary | ICD-10-CM | POA: Diagnosis not present

## 2012-04-23 DIAGNOSIS — M1710 Unilateral primary osteoarthritis, unspecified knee: Secondary | ICD-10-CM | POA: Diagnosis not present

## 2012-05-06 DIAGNOSIS — H353190 Nonexudative age-related macular degeneration, unspecified eye, stage unspecified: Secondary | ICD-10-CM | POA: Diagnosis not present

## 2012-05-06 DIAGNOSIS — H43819 Vitreous degeneration, unspecified eye: Secondary | ICD-10-CM | POA: Diagnosis not present

## 2012-05-27 ENCOUNTER — Other Ambulatory Visit: Payer: Self-pay | Admitting: Family Medicine

## 2012-07-03 ENCOUNTER — Telehealth: Payer: Self-pay | Admitting: Family Medicine

## 2012-07-03 ENCOUNTER — Encounter: Payer: Self-pay | Admitting: Family Medicine

## 2012-07-03 ENCOUNTER — Ambulatory Visit (INDEPENDENT_AMBULATORY_CARE_PROVIDER_SITE_OTHER): Payer: Medicare Other | Admitting: Family Medicine

## 2012-07-03 VITALS — BP 130/82 | Temp 97.9°F | Wt 158.0 lb

## 2012-07-03 DIAGNOSIS — R1031 Right lower quadrant pain: Secondary | ICD-10-CM

## 2012-07-03 DIAGNOSIS — R1032 Left lower quadrant pain: Secondary | ICD-10-CM

## 2012-07-03 DIAGNOSIS — N949 Unspecified condition associated with female genital organs and menstrual cycle: Secondary | ICD-10-CM | POA: Diagnosis not present

## 2012-07-03 DIAGNOSIS — R102 Pelvic and perineal pain: Secondary | ICD-10-CM

## 2012-07-03 LAB — CBC WITH DIFFERENTIAL/PLATELET
Basophils Absolute: 0 10*3/uL (ref 0.0–0.1)
Basophils Relative: 0.4 % (ref 0.0–3.0)
Eosinophils Absolute: 0.2 10*3/uL (ref 0.0–0.7)
Hemoglobin: 12.1 g/dL (ref 12.0–15.0)
Lymphocytes Relative: 23.8 % (ref 12.0–46.0)
MCHC: 33.1 g/dL (ref 30.0–36.0)
MCV: 92.8 fl (ref 78.0–100.0)
Monocytes Absolute: 0.5 10*3/uL (ref 0.1–1.0)
Neutro Abs: 3.1 10*3/uL (ref 1.4–7.7)
Neutrophils Relative %: 62.2 % (ref 43.0–77.0)
RBC: 3.95 Mil/uL (ref 3.87–5.11)
RDW: 13.9 % (ref 11.5–14.6)

## 2012-07-03 LAB — POCT URINALYSIS DIPSTICK
Glucose, UA: NEGATIVE
Nitrite, UA: NEGATIVE
Protein, UA: 30
Spec Grav, UA: <=1.005
Urobilinogen, UA: 0.2

## 2012-07-03 NOTE — Progress Notes (Signed)
  Subjective:    Patient ID: Carolyn Henderson, female    DOB: 03-08-1930, 78 y.o.   MRN: [id number]  HPI  Abdominal pain. Started 3 days ago. Location is diffuse lower abdomen. No urinary symptoms such as frequency or burning. No constipation. No diarrhea. Symptoms are off and on. Worse with position change. Denies injury. Pain is relatively mild. She's had increased fatigue this week. No nausea or vomiting. No fever. No alleviating factors. No significant vaginal discharge.    Review of Systems  Constitutional: Positive for fatigue. Negative for fever and chills.  Respiratory: Negative for cough and shortness of breath.   Cardiovascular: Negative for chest pain.  Gastrointestinal: Positive for abdominal pain. Negative for nausea, vomiting, diarrhea, constipation, blood in stool and abdominal distention.  Genitourinary: Negative for dysuria.  Skin: Negative for rash.       Objective:   Physical Exam  Constitutional: She appears well-developed and well-nourished.  Cardiovascular: Normal rate and regular rhythm.   Pulmonary/Chest: Effort normal and breath sounds normal. No respiratory distress. She has no wheezes. She has no rales.  Abdominal: Soft. Bowel sounds are normal. She exhibits no distension and no mass. There is no rebound and no guarding.       Minimally tender right lower quadrant and suprapubic area to deep palpation. Very minimal tenderness. No guarding or rebound. No masses.          Assessment & Plan:  Abdominal pain. Bilateral and relatively mild. Doubt diverticulitis with current distribution. No recent constipation to suggest likely impaction. She's having regular bowel movements. doubt UTI even though she has some leukocytes but no urinary symptoms and negative nitrite and blood. Urine culture sent. Check CBC. Followup immediately for any fever, vomiting, bloody stools, or worsening pain

## 2012-07-03 NOTE — Telephone Encounter (Signed)
Caller: [name]; Patient Name: Carolyn Henderson; PCP: Evelena Peat [facility]); Best Callback Phone Number: [phone].  Call Regarding Lower Abdominal Pain with walking, onset 9-16. Patient doesn't have Pain while sitting still.  Last bowl movement was 9-19.  Afebrile. Abdominal Pain protocol used , emergency room due to generalized pain made worse with movement or standing.  Appointment scheduled on 9-19 at 1430 with Dr Caryl Never.  Patient verbalized understanding.

## 2012-07-03 NOTE — Progress Notes (Signed)
Quick Note:  Pt informed ______ 

## 2012-07-03 NOTE — Patient Instructions (Addendum)
Follow up immediately for any progressive abdominal pain, vomiting, fever, bloody stools.

## 2012-07-05 MED ORDER — CIPROFLOXACIN HCL 500 MG PO TABS: 500.0000 mg | ORAL_TABLET | Freq: Two times a day (BID) | ORAL | Status: DC

## 2012-07-05 NOTE — Addendum Note (Signed)
Addended by: Kristian Covey on: 07/05/2012 03:15 PM   Modules accepted: Orders

## 2012-07-06 LAB — URINE CULTURE: Colony Count: >=100000

## 2012-07-07 DIAGNOSIS — H3530 Unspecified macular degeneration: Secondary | ICD-10-CM | POA: Diagnosis not present

## 2012-07-07 DIAGNOSIS — H40059 Ocular hypertension, unspecified eye: Secondary | ICD-10-CM | POA: Diagnosis not present

## 2012-07-07 DIAGNOSIS — H40019 Open angle with borderline findings, low risk, unspecified eye: Secondary | ICD-10-CM | POA: Diagnosis not present

## 2012-07-07 DIAGNOSIS — H52209 Unspecified astigmatism, unspecified eye: Secondary | ICD-10-CM | POA: Diagnosis not present

## 2012-07-10 ENCOUNTER — Telehealth: Payer: Self-pay | Admitting: Family Medicine

## 2012-07-10 NOTE — Telephone Encounter (Signed)
FYI only.

## 2012-07-10 NOTE — Telephone Encounter (Signed)
Caller: Ronetta/Patient; Patient Name: Carolyn Henderson; PCP: Evelena Peat [facility]); Best Callback Phone Number: [phone]; Reason for call: She was following up on her Urine culture results.  She is currently on Ciprofloxin. Reviewed EPIC. Advised to complete Cipro due to Culture results.    She states is Afebrile, Last UOP 09:00.   Occasional back pain but has arthritis, no vaginal discharge. She does feel that she is better. Emergent s/sx ruled out per urinary Symptoms Protocol with the exception to "all other situations".  Advise caller to closely mointor her s/sx . Please call back once medication is completed for repeat U/A for evaluation. Understanding expressed. Home care reviewed. Caller will call back for questions, changes or concerns

## 2012-07-14 ENCOUNTER — Telehealth: Payer: Self-pay | Admitting: Family Medicine

## 2012-07-14 NOTE — Telephone Encounter (Signed)
Pt was told to repeat UA once she was finish with abx on Saturday. Please put order in for UA

## 2012-07-14 NOTE — Telephone Encounter (Signed)
Pt informed I do not see where Dr Caryl Never recommended repeat UA, so pt is OK with that.  CAN had suggested repeat UA

## 2012-08-04 ENCOUNTER — Other Ambulatory Visit: Payer: Self-pay | Admitting: Family Medicine

## 2012-08-06 ENCOUNTER — Encounter: Payer: Self-pay | Admitting: Family Medicine

## 2012-08-06 ENCOUNTER — Ambulatory Visit (INDEPENDENT_AMBULATORY_CARE_PROVIDER_SITE_OTHER): Payer: Medicare Other | Admitting: Family Medicine

## 2012-08-06 VITALS — BP 122/72 | Temp 98.0°F | Wt 158.0 lb

## 2012-08-06 DIAGNOSIS — J069 Acute upper respiratory infection, unspecified: Secondary | ICD-10-CM

## 2012-08-06 MED ORDER — BENZONATATE 200 MG PO CAPS: 200.0000 mg | ORAL_CAPSULE | Freq: Three times a day (TID) | ORAL | Status: DC | PRN

## 2012-08-06 NOTE — Patient Instructions (Signed)

## 2012-08-06 NOTE — Progress Notes (Signed)
  Subjective:    Patient ID: Carolyn Henderson, female    DOB: 01/10/1930, 78 y.o.   MRN: [id number]  HPI  Acute visit. Onset about 4 days ago of sore throat. Subsequently developed nasal congestion and occasionally productive cough. No fever. Increased body aches and fatigue. No nausea or vomiting. Over-the-counter cough medications with minimal relief of cough. No wheezing. No dyspnea. Nonsmoker. No sick contacts  Past Medical History  Diagnosis Date  . Hypertension   . Hyperlipidemia   . Thyroid disease   . Arthritis    Past Surgical History  Procedure Date  . Cataract extraction   . Total hip arthroplasty   . Abdominal hysterectomy   . Lumbar laminectomy     reports that she has quit smoking. She does not have any smokeless tobacco history on file. She reports that she does not drink alcohol. Her drug history not on file. family history includes Cancer in an unspecified family member and Hypertension in an unspecified family member. Allergies  Allergen Reactions  . Cephalosporins   . Codeine Sulfate   . Hydrocodone     Sores in mouth, itiching  . Penicillins       Review of Systems  Constitutional: Positive for fatigue. Negative for fever and chills.  HENT: Positive for congestion and sore throat.   Respiratory: Positive for cough. Negative for shortness of breath and wheezing.   Cardiovascular: Negative for chest pain.       Objective:   Physical Exam  Constitutional: She appears well-developed and well-nourished.  HENT:  Mouth/Throat: Oropharynx is clear and moist.       Patient has some bilateral scarring. Small serous effusion left eardrum  Neck: Neck supple.  Cardiovascular: Normal rate and regular rhythm.   Pulmonary/Chest: Effort normal and breath sounds normal. No respiratory distress. She has no wheezes. She has no rales.  Lymphadenopathy:    She has no cervical adenopathy.          Assessment & Plan:  Viral URI. No indication for antibiotics at  this time. Tessalon Perles 200 mg every 8 hours as needed for cough. Followup promptly for fever. She will return for flu vaccine within the next few weeks.

## 2012-09-16 ENCOUNTER — Ambulatory Visit (INDEPENDENT_AMBULATORY_CARE_PROVIDER_SITE_OTHER): Payer: Medicare Other | Admitting: Family Medicine

## 2012-09-16 DIAGNOSIS — Z23 Encounter for immunization: Secondary | ICD-10-CM | POA: Diagnosis not present

## 2012-12-03 DIAGNOSIS — L570 Actinic keratosis: Secondary | ICD-10-CM | POA: Diagnosis not present

## 2012-12-03 DIAGNOSIS — L821 Other seborrheic keratosis: Secondary | ICD-10-CM | POA: Diagnosis not present

## 2013-02-03 ENCOUNTER — Ambulatory Visit (INDEPENDENT_AMBULATORY_CARE_PROVIDER_SITE_OTHER): Payer: Medicare Other | Admitting: Family Medicine

## 2013-02-03 ENCOUNTER — Encounter: Payer: Self-pay | Admitting: Family Medicine

## 2013-02-03 VITALS — BP 122/72 | Temp 97.9°F | Wt 161.0 lb

## 2013-02-03 DIAGNOSIS — I10 Essential (primary) hypertension: Secondary | ICD-10-CM

## 2013-02-03 DIAGNOSIS — E039 Hypothyroidism, unspecified: Secondary | ICD-10-CM | POA: Diagnosis not present

## 2013-02-03 DIAGNOSIS — B0229 Other postherpetic nervous system involvement: Secondary | ICD-10-CM

## 2013-02-03 DIAGNOSIS — Z2911 Encounter for prophylactic immunotherapy for respiratory syncytial virus (RSV): Secondary | ICD-10-CM | POA: Diagnosis not present

## 2013-02-03 LAB — BASIC METABOLIC PANEL
BUN: 18 mg/dL (ref 6–23)
Calcium: 8.9 mg/dL (ref 8.4–10.5)
Chloride: 98 mEq/L (ref 96–112)
Creatinine, Ser: 0.7 mg/dL (ref 0.4–1.2)
GFR: 86.83 mL/min (ref >60.00)

## 2013-02-03 LAB — TSH: TSH: 7.18 u[IU]/mL — ABNORMAL HIGH (ref 0.35–5.50)

## 2013-02-03 NOTE — Progress Notes (Signed)
  Subjective:    Patient ID: Carolyn Henderson, female    DOB: Oct 20, 1929, 79 y.o.   MRN: [id number]  HPI Medical followup  Hypertension treated with amlodipine and HCTZ. Blood pressure well-controlled. No orthostasis. Compliant with therapy.  No medication side effects.  Previous history of shingles. She had very painful postherpetic neuralgia-chest wall. She still takes low-dose gabapentin. Has tried tapering off without success. Requesting shingles vaccine. No contraindications  Patient has history of hypothyroidism treated with levothyroxine. Needs followup lab work. No fatigue issues. Exercising regularly  Past Medical History  Diagnosis Date  . Hypertension   . Hyperlipidemia   . Thyroid disease   . Arthritis    Past Surgical History  Procedure Laterality Date  . Cataract extraction    . Total hip arthroplasty    . Abdominal hysterectomy    . Lumbar laminectomy      reports that she has quit smoking. She does not have any smokeless tobacco history on file. She reports that she does not drink alcohol. Her drug history is not on file. family history includes Cancer in an unspecified family member and Hypertension in an unspecified family member. Allergies  Allergen Reactions  . Cephalosporins   . Codeine Sulfate   . Hydrocodone     Sores in mouth, itiching  . Penicillins       Review of Systems  Constitutional: Negative for fatigue and unexpected weight change.  Eyes: Negative for visual disturbance.  Respiratory: Negative for cough, chest tightness, shortness of breath and wheezing.   Cardiovascular: Negative for chest pain, palpitations and leg swelling.  Neurological: Negative for dizziness, seizures, syncope, weakness, light-headedness and headaches.       Objective:   Physical Exam  Constitutional: She is oriented to person, place, and time. She appears well-developed and well-nourished. No distress.  Neck: Neck supple. No thyromegaly present.   Cardiovascular: Normal rate and regular rhythm.   Pulmonary/Chest: Effort normal and breath sounds normal. No respiratory distress. She has no wheezes. She has no rales.  Musculoskeletal: She exhibits no edema.  Neurological: She is alert and oriented to person, place, and time.  Psychiatric: She has a normal mood and affect. Her behavior is normal.          Assessment & Plan:  #1 hypertension. Well controlled #2 hypothyroidism. Recheck TSH  #3 health maintenance. Shingles vaccine given. No contraindications.  We discussed pros and cons of vaccine and she consents. #4 post herpectic neuralgia.  Mild pain at this time.  She will try further tapering of gabapentin at some point this year.

## 2013-02-05 ENCOUNTER — Other Ambulatory Visit: Payer: Self-pay | Admitting: *Deleted

## 2013-02-05 DIAGNOSIS — E039 Hypothyroidism, unspecified: Secondary | ICD-10-CM

## 2013-02-05 MED ORDER — SYNTHROID 125 MCG PO TABS: 125.0000 ug | ORAL_TABLET | Freq: Every day | ORAL | Status: DC

## 2013-03-16 ENCOUNTER — Other Ambulatory Visit: Payer: Self-pay | Admitting: Family Medicine

## 2013-03-18 ENCOUNTER — Other Ambulatory Visit: Payer: Self-pay | Admitting: Family Medicine

## 2013-04-08 ENCOUNTER — Other Ambulatory Visit: Payer: Self-pay

## 2013-04-08 DIAGNOSIS — Z1231 Encounter for screening mammogram for malignant neoplasm of breast: Secondary | ICD-10-CM

## 2013-04-10 ENCOUNTER — Other Ambulatory Visit: Payer: Self-pay | Admitting: Family Medicine

## 2013-04-13 ENCOUNTER — Other Ambulatory Visit: Payer: Self-pay

## 2013-04-13 ENCOUNTER — Encounter: Payer: Self-pay | Admitting: Family Medicine

## 2013-04-13 ENCOUNTER — Ambulatory Visit (INDEPENDENT_AMBULATORY_CARE_PROVIDER_SITE_OTHER): Payer: Medicare Other | Admitting: Family Medicine

## 2013-04-13 VITALS — BP 120/68 | HR 68 | Temp 97.5°F | Ht 63.0 in | Wt 152.0 lb

## 2013-04-13 DIAGNOSIS — B9789 Other viral agents as the cause of diseases classified elsewhere: Secondary | ICD-10-CM

## 2013-04-13 DIAGNOSIS — N390 Urinary tract infection, site not specified: Secondary | ICD-10-CM | POA: Diagnosis not present

## 2013-04-13 DIAGNOSIS — B349 Viral infection, unspecified: Secondary | ICD-10-CM

## 2013-04-13 LAB — POCT URINALYSIS DIPSTICK
Blood, UA: NEGATIVE
Leukocytes, UA: NEGATIVE
Nitrite, UA: NEGATIVE
Protein, UA: NEGATIVE
Urobilinogen, UA: 0.2
pH, UA: 6.5

## 2013-04-13 MED ORDER — CLOBETASOL PROPIONATE 0.05 % EX OINT: 1.0000 "application " | TOPICAL_OINTMENT | Freq: Two times a day (BID) | CUTANEOUS | Status: DC

## 2013-04-13 MED ORDER — AMLODIPINE BESYLATE 2.5 MG PO TABS: ORAL_TABLET | ORAL | Status: DC

## 2013-04-13 NOTE — Patient Instructions (Signed)
Follow up promptly for any fever or persistent symptoms of fatigue or body aches.

## 2013-04-13 NOTE — Progress Notes (Signed)
  Subjective:    Patient ID: Carolyn Henderson, female    DOB: [birth year], 79 y.o.   MRN: [id number]  HPI Patient seen with nonspecific symptoms of body aches chills but no fever and increased malaise. Onset of symptoms about 6 days ago. She was concerned about possible UTI but has not had any burning with urination. Denies any headaches. No nasal congestion. No sore throat. No body rashes. Achiness is worse in her lower extremities. She does not take any statins. No appetite or weight changes  Patient did pool deer tick off about 8 weeks ago. No associated rash. No headaches. No specific arthralgias. No recent travels to endemic area.  Past Medical History  Diagnosis Date  . Hypertension   . Hyperlipidemia   . Thyroid disease   . Arthritis    Past Surgical History  Procedure Laterality Date  . Cataract extraction    . Total hip arthroplasty    . Abdominal hysterectomy    . Lumbar laminectomy      reports that she has quit smoking. She does not have any smokeless tobacco history on file. She reports that she does not drink alcohol. Her drug history is not on file. family history includes Cancer in an unspecified family member and Hypertension in an unspecified family member. Allergies  Allergen Reactions  . Cephalosporins   . Codeine Sulfate   . Hydrocodone     Sores in mouth, itiching  . Penicillins       Review of Systems  Constitutional: Positive for fatigue. Negative for fever, chills, appetite change and unexpected weight change.  HENT: Negative for congestion.   Respiratory: Negative for cough and shortness of breath.   Cardiovascular: Negative for chest pain.  Gastrointestinal: Negative for abdominal pain.  Genitourinary: Negative for dysuria, hematuria and flank pain.  Neurological: Negative for headaches.       Objective:   Physical Exam  Constitutional: She appears well-developed and well-nourished.  HENT:  Right Ear: External ear normal.  Left Ear:  External ear normal.  Mouth/Throat: Oropharynx is clear and moist.  Neck: Neck supple.  Cardiovascular: Normal rate.   Pulmonary/Chest: Effort normal and breath sounds normal. No respiratory distress. She has no wheezes. She has no rales.  Lymphadenopathy:    She has no cervical adenopathy.  Skin: No rash noted.          Assessment & Plan:  Probable viral syndrome. Patient has nonfocal exam. Urine dipstick reveals no evidence for UTI. Patient did describe bite from deer tick couple months ago but did not have any typical rash such as erythema chronicum migrans. She's not any specific arthralgias. We elected not to do any Lyme testing at this time but if symptoms persist in the next week consider further testing

## 2013-04-27 ENCOUNTER — Ambulatory Visit
Admission: RE | Admit: 2013-04-27 | Discharge: 2013-04-27 | Disposition: A | Discharge status: Home / Self Care | Payer: Medicare Other | Source: Ambulatory Visit

## 2013-04-27 DIAGNOSIS — Z1231 Encounter for screening mammogram for malignant neoplasm of breast: Secondary | ICD-10-CM

## 2013-05-07 DIAGNOSIS — H353190 Nonexudative age-related macular degeneration, unspecified eye, stage unspecified: Secondary | ICD-10-CM | POA: Diagnosis not present

## 2013-05-07 DIAGNOSIS — H26499 Other secondary cataract, unspecified eye: Secondary | ICD-10-CM | POA: Diagnosis not present

## 2013-05-11 ENCOUNTER — Other Ambulatory Visit (INDEPENDENT_AMBULATORY_CARE_PROVIDER_SITE_OTHER): Payer: Medicare Other

## 2013-05-11 DIAGNOSIS — E039 Hypothyroidism, unspecified: Secondary | ICD-10-CM | POA: Diagnosis not present

## 2013-05-12 MED ORDER — SYNTHROID 125 MCG PO TABS: 125.0000 ug | ORAL_TABLET | Freq: Every day | ORAL | Status: DC

## 2013-05-12 NOTE — Addendum Note (Signed)
Addended by: Thomasena Edis on: 05/12/2013 10:46 AM   Modules accepted: Orders

## 2013-05-15 DIAGNOSIS — Z96659 Presence of unspecified artificial knee joint: Secondary | ICD-10-CM | POA: Diagnosis not present

## 2013-05-15 DIAGNOSIS — Z96649 Presence of unspecified artificial hip joint: Secondary | ICD-10-CM | POA: Diagnosis not present

## 2013-05-27 ENCOUNTER — Other Ambulatory Visit: Payer: Self-pay | Admitting: Family Medicine

## 2013-06-14 ENCOUNTER — Other Ambulatory Visit: Payer: Self-pay | Admitting: Family Medicine

## 2013-07-07 DIAGNOSIS — H40019 Open angle with borderline findings, low risk, unspecified eye: Secondary | ICD-10-CM | POA: Diagnosis not present

## 2013-07-07 DIAGNOSIS — Z961 Presence of intraocular lens: Secondary | ICD-10-CM | POA: Diagnosis not present

## 2013-07-07 DIAGNOSIS — H353190 Nonexudative age-related macular degeneration, unspecified eye, stage unspecified: Secondary | ICD-10-CM | POA: Diagnosis not present

## 2013-07-07 DIAGNOSIS — H52209 Unspecified astigmatism, unspecified eye: Secondary | ICD-10-CM | POA: Diagnosis not present

## 2013-08-05 ENCOUNTER — Ambulatory Visit (INDEPENDENT_AMBULATORY_CARE_PROVIDER_SITE_OTHER): Payer: Medicare Other | Admitting: Family Medicine

## 2013-08-05 DIAGNOSIS — Z23 Encounter for immunization: Secondary | ICD-10-CM | POA: Diagnosis not present

## 2013-10-12 ENCOUNTER — Other Ambulatory Visit: Payer: Self-pay | Admitting: Family Medicine

## 2013-10-12 DIAGNOSIS — H04129 Dry eye syndrome of unspecified lacrimal gland: Secondary | ICD-10-CM | POA: Diagnosis not present

## 2013-10-30 ENCOUNTER — Encounter: Payer: Self-pay | Admitting: Family Medicine

## 2013-10-30 ENCOUNTER — Ambulatory Visit (INDEPENDENT_AMBULATORY_CARE_PROVIDER_SITE_OTHER): Payer: Medicare Other | Admitting: Family Medicine

## 2013-10-30 VITALS — BP 126/70 | HR 61 | Temp 97.8°F | Wt 160.0 lb

## 2013-10-30 DIAGNOSIS — B029 Zoster without complications: Secondary | ICD-10-CM

## 2013-10-30 DIAGNOSIS — H6120 Impacted cerumen, unspecified ear: Secondary | ICD-10-CM | POA: Diagnosis not present

## 2013-10-30 DIAGNOSIS — E039 Hypothyroidism, unspecified: Secondary | ICD-10-CM | POA: Diagnosis not present

## 2013-10-30 DIAGNOSIS — I10 Essential (primary) hypertension: Secondary | ICD-10-CM

## 2013-10-30 DIAGNOSIS — B0229 Other postherpetic nervous system involvement: Secondary | ICD-10-CM | POA: Diagnosis not present

## 2013-10-30 NOTE — Progress Notes (Signed)
   Subjective:    Patient ID: Carolyn Henderson, female    DOB: 1930/02/03, 80 y.o.   MRN: [id number]  HPI Medical followup. She has history of hypertension, Hyperlipidemia, hypothyroidism. She had severe case of shingles couple years ago and still has some postherpetic neuralgia. Takes gabapentin 300 mg twice a day and this controlled her pain fairly well. She's been unable to taper off altogether. She had TSH over 7 last summer and we titrated her medication last TSH last visit was normal range. She takes amlodipine and HCTZ for hypertension. No dizziness. No headaches. No chest pains. She exercises a few days per week. No recent falls. Mood is stable. Compliant with all medications  Past Medical History  Diagnosis Date  . Hypertension   . Hyperlipidemia   . Thyroid disease   . Arthritis    Past Surgical History  Procedure Laterality Date  . Cataract extraction    . Total hip arthroplasty    . Abdominal hysterectomy    . Lumbar laminectomy      reports that she has quit smoking. She does not have any smokeless tobacco history on file. She reports that she does not drink alcohol. Her drug history is not on file. family history includes Cancer in an other family member; Hypertension in an other family member. Allergies  Allergen Reactions  . Cephalosporins   . Codeine Sulfate   . Hydrocodone     Sores in mouth, itiching  . Penicillins       Review of Systems  Constitutional: Negative for fatigue.  HENT: Negative for ear pain and hearing loss.   Eyes: Negative for visual disturbance.  Respiratory: Negative for cough, chest tightness, shortness of breath and wheezing.   Cardiovascular: Negative for chest pain, palpitations and leg swelling.  Endocrine: Negative for polydipsia and polyuria.  Genitourinary: Negative for dysuria.  Skin: Negative for rash.  Neurological: Negative for dizziness, seizures, syncope, weakness, light-headedness and headaches.       Objective:   Physical Exam  Constitutional: She appears well-developed and well-nourished.  HENT:  Cerumen both  Canals removed with currette  Neck: Neck supple. No thyromegaly present.  Cardiovascular: Normal rate.   Pulmonary/Chest: Effort normal and breath sounds normal. No respiratory distress. She has no wheezes. She has no rales.  Musculoskeletal: She exhibits no edema.          Assessment & Plan:  #1 hypertension. Stable at goal. Continue current medications. Check basic metabolic panel at followup #2 hypothyroidism. Recent TSH at goal. Recheck TSH at followup in 6 months #3 history postherpetic neuralgia. Symptomatically stable on low-dose gabapentin #4 cerumen impaction both ear canals. Removed with curette.

## 2013-10-30 NOTE — Progress Notes (Signed)
Pre visit review using our clinic review tool, if applicable. No additional management support is needed unless otherwise documented below in the visit note. 

## 2013-11-02 ENCOUNTER — Telehealth: Payer: Self-pay | Admitting: Family Medicine

## 2013-11-02 NOTE — Telephone Encounter (Signed)
Relevant patient education mailed to patient.  

## 2013-11-05 DIAGNOSIS — H353190 Nonexudative age-related macular degeneration, unspecified eye, stage unspecified: Secondary | ICD-10-CM | POA: Diagnosis not present

## 2013-11-05 DIAGNOSIS — H43819 Vitreous degeneration, unspecified eye: Secondary | ICD-10-CM | POA: Diagnosis not present

## 2014-01-14 DIAGNOSIS — I781 Nevus, non-neoplastic: Secondary | ICD-10-CM | POA: Diagnosis not present

## 2014-01-14 DIAGNOSIS — L259 Unspecified contact dermatitis, unspecified cause: Secondary | ICD-10-CM | POA: Diagnosis not present

## 2014-01-14 DIAGNOSIS — D239 Other benign neoplasm of skin, unspecified: Secondary | ICD-10-CM | POA: Diagnosis not present

## 2014-01-27 DIAGNOSIS — H04129 Dry eye syndrome of unspecified lacrimal gland: Secondary | ICD-10-CM | POA: Diagnosis not present

## 2014-02-09 ENCOUNTER — Other Ambulatory Visit: Payer: Self-pay

## 2014-02-09 MED ORDER — SYNTHROID 125 MCG PO TABS: 125.0000 ug | ORAL_TABLET | Freq: Every day | ORAL | Status: DC

## 2014-02-09 MED ORDER — CLOBETASOL PROPIONATE 0.05 % EX OINT: 1.0000 "application " | TOPICAL_OINTMENT | Freq: Two times a day (BID) | CUTANEOUS | Status: DC

## 2014-03-12 ENCOUNTER — Ambulatory Visit (INDEPENDENT_AMBULATORY_CARE_PROVIDER_SITE_OTHER): Payer: Medicare Other | Admitting: Family Medicine

## 2014-03-12 ENCOUNTER — Encounter: Payer: Self-pay | Admitting: Family Medicine

## 2014-03-12 VITALS — BP 126/68 | HR 68 | Temp 97.7°F | Wt 160.0 lb

## 2014-03-12 DIAGNOSIS — L500 Allergic urticaria: Secondary | ICD-10-CM

## 2014-03-12 MED ORDER — PREDNISONE 10 MG PO TABS: ORAL_TABLET | ORAL | Status: DC

## 2014-03-12 NOTE — Patient Instructions (Signed)

## 2014-03-12 NOTE — Progress Notes (Signed)
   Subjective:    Patient ID: Carolyn Henderson, female    DOB: 11-14-1929, 80 y.o.   MRN: [id number]  Rash Pertinent negatives include no fever.   Patient seen with pruritic rash. Onset yesterday. She's had similar rashes in the past. She has small urticarial type lesions on her trunk, upper and lower extremities. No recent change in soap or detergent. No history of food allergy. No clear triggering factors. She's taken Zyrtec, Claritin, and Benadryl without much with. Previously took prednisone which did seem to help and assistance from dermatologist many years ago.  Denies any recent fever. No recent respiratory symptoms. No recent change of medication. Rash exacerbated by heat  Past Medical History  Diagnosis Date  . Hypertension   . Hyperlipidemia   . Thyroid disease   . Arthritis    Past Surgical History  Procedure Laterality Date  . Cataract extraction    . Total hip arthroplasty    . Abdominal hysterectomy    . Lumbar laminectomy      reports that she has quit smoking. She does not have any smokeless tobacco history on file. She reports that she does not drink alcohol. Her drug history is not on file. family history includes Cancer in an other family member; Hypertension in an other family member. Allergies  Allergen Reactions  . Cephalosporins   . Codeine Sulfate   . Hydrocodone     Sores in mouth, itiching  . Penicillins       Review of Systems  Constitutional: Negative for fever and chills.  Skin: Positive for rash.       Objective:   Physical Exam  Constitutional: She appears well-developed and well-nourished.  Cardiovascular: Normal rate.   Pulmonary/Chest: Effort normal and breath sounds normal. No respiratory distress. She has no wheezes. She has no rales.  Skin: Rash noted.  Patient has some scattered small urticarial lesions which are slightly erythematous and slightly raised. No vesicles. No pustules. They blanch with pressure. These are scattered over  trunk, upper, and lower extremities          Assessment & Plan:  Urticarial lesions.  uncertain etiology. Continue antihistamine. Prednisone taper and reviewed possible side effects.

## 2014-03-12 NOTE — Progress Notes (Signed)
Pre visit review using our clinic review tool, if applicable. No additional management support is needed unless otherwise documented below in the visit note. 

## 2014-03-18 ENCOUNTER — Encounter: Payer: Self-pay | Admitting: Family Medicine

## 2014-03-18 ENCOUNTER — Ambulatory Visit (INDEPENDENT_AMBULATORY_CARE_PROVIDER_SITE_OTHER): Payer: Medicare Other | Admitting: Family Medicine

## 2014-03-18 VITALS — BP 130/74 | HR 69 | Temp 97.6°F | Wt 160.0 lb

## 2014-03-18 DIAGNOSIS — L509 Urticaria, unspecified: Secondary | ICD-10-CM

## 2014-03-18 MED ORDER — METHYLPREDNISOLONE ACETATE 80 MG/ML IJ SUSP
80.0000 mg | Freq: Once | INTRAMUSCULAR | Status: AC
  Administered 2014-03-18: 80 mg via INTRAMUSCULAR

## 2014-03-18 NOTE — Progress Notes (Signed)
Pre visit review using our clinic review tool, if applicable. No additional management support is needed unless otherwise documented below in the visit note. 

## 2014-03-18 NOTE — Progress Notes (Signed)
   Subjective:    Patient ID: Carolyn Henderson, female    DOB: [birth year], 80 y.o.   MRN: [id number]  Urticaria Pertinent negatives include no fever.   Patient seen with persistent urticarial type rash on her trunk forearms and upper thighs. She was seen 5 days ago placed on prednisone as she had already tried 3 different antihistamines without improvement. No clear triggers. She's had similar reaction the past which did respond to prednisone. She has actually had some slight progression of rash on her trunk region. No fever chills. No dyspnea. No recent change in medication.  Past Medical History  Diagnosis Date  . Hypertension   . Hyperlipidemia   . Thyroid disease   . Arthritis    Past Surgical History  Procedure Laterality Date  . Cataract extraction    . Total hip arthroplasty    . Abdominal hysterectomy    . Lumbar laminectomy      reports that she has quit smoking. She does not have any smokeless tobacco history on file. She reports that she does not drink alcohol. Her drug history is not on file. family history includes Cancer in an other family member; Hypertension in an other family member. Allergies  Allergen Reactions  . Cephalosporins   . Codeine Sulfate   . Hydrocodone     Sores in mouth, itiching  . Penicillins       Review of Systems  Constitutional: Negative for fever and chills.  Skin: Positive for rash.       Objective:   Physical Exam  Constitutional: She appears well-developed and well-nourished.  Cardiovascular: Normal rate.   Pulmonary/Chest: Effort normal and breath sounds normal. No respiratory distress. She has no wheezes. She has no rales.  Skin: Rash noted.  Patient has scattered rash involving her forearms trunk anteriorly and upper thighs. This is slightly raised and blanches with pressure. Nonscaly. Some areas of confluence          Assessment & Plan:  Urticarial type rash. Unclear trigger. Depo-Medrol 80 mg IM. Continue  antihistamines. If not improving by next week consider either dermatology referral or skin biopsy. Patient agrees with this plan.

## 2014-03-18 NOTE — Patient Instructions (Signed)
Follow up by early next week if rash not improving.

## 2014-04-09 ENCOUNTER — Other Ambulatory Visit: Payer: Self-pay | Admitting: Family Medicine

## 2014-04-20 ENCOUNTER — Other Ambulatory Visit: Payer: Self-pay | Admitting: Family Medicine

## 2014-04-30 ENCOUNTER — Encounter: Payer: Self-pay | Admitting: Family Medicine

## 2014-04-30 ENCOUNTER — Ambulatory Visit (INDEPENDENT_AMBULATORY_CARE_PROVIDER_SITE_OTHER): Payer: Medicare Other | Admitting: Family Medicine

## 2014-04-30 VITALS — BP 120/70 | HR 60 | Wt 158.0 lb

## 2014-04-30 DIAGNOSIS — E785 Hyperlipidemia, unspecified: Secondary | ICD-10-CM

## 2014-04-30 DIAGNOSIS — M899 Disorder of bone, unspecified: Secondary | ICD-10-CM

## 2014-04-30 DIAGNOSIS — E039 Hypothyroidism, unspecified: Secondary | ICD-10-CM | POA: Diagnosis not present

## 2014-04-30 DIAGNOSIS — I10 Essential (primary) hypertension: Secondary | ICD-10-CM

## 2014-04-30 DIAGNOSIS — M949 Disorder of cartilage, unspecified: Secondary | ICD-10-CM

## 2014-04-30 DIAGNOSIS — M8580 Other specified disorders of bone density and structure, unspecified site: Secondary | ICD-10-CM

## 2014-04-30 LAB — BASIC METABOLIC PANEL
BUN: 16 mg/dL (ref 6–23)
CALCIUM: 8.8 mg/dL (ref 8.4–10.5)
CHLORIDE: 98 meq/L (ref 96–112)
CO2: 25 mEq/L (ref 19–32)
Creatinine, Ser: 0.7 mg/dL (ref 0.4–1.2)
GFR: 92.74 mL/min (ref >60.00)
GLUCOSE: 93 mg/dL (ref 70–99)
Potassium: 4.6 mEq/L (ref 3.5–5.1)
SODIUM: 130 meq/L — AB (ref 135–145)

## 2014-04-30 LAB — TSH: TSH: 4.16 u[IU]/mL (ref 0.35–4.50)

## 2014-04-30 NOTE — Progress Notes (Signed)
Pre visit review using our clinic review tool, if applicable. No additional management support is needed unless otherwise documented below in the visit note. 

## 2014-04-30 NOTE — Progress Notes (Signed)
   Subjective:    Patient ID: Carolyn Henderson, female    DOB: 10-11-1930, 80 y.o.   MRN: [id number]  HPI Medical followup  Hypothyroidism. She takes Synthroid 125 mcgs once daily. Compliant with therapy. Due for labs. She does have some general fatigue issues which are more or less chronic  History of shingles. Postherpetic neuralgia. Still has low-grade pain. Unable to tolerate tapering off gabapentin  Hypertension treated with amlodipine and HCTZ. Blood pressure stable. No recent orthostasis  Osteopenia.  On calcium and Vit D.  She is not sure she wishes to take any osteoporosis medications.  Past Medical History  Diagnosis Date  . Hypertension   . Hyperlipidemia   . Thyroid disease   . Arthritis    Past Surgical History  Procedure Laterality Date  . Cataract extraction    . Total hip arthroplasty    . Abdominal hysterectomy    . Lumbar laminectomy      reports that she has quit smoking. She does not have any smokeless tobacco history on file. She reports that she does not drink alcohol. Her drug history is not on file. family history includes Cancer in an other family member; Hypertension in an other family member. Allergies  Allergen Reactions  . Cephalosporins   . Codeine Sulfate   . Hydrocodone     Sores in mouth, itiching  . Penicillins       Review of Systems  Constitutional: Positive for fatigue. Negative for fever, chills and unexpected weight change.  Eyes: Negative for visual disturbance.  Respiratory: Negative for cough, chest tightness, shortness of breath and wheezing.   Cardiovascular: Negative for chest pain, palpitations and leg swelling.  Neurological: Negative for dizziness, seizures, syncope, weakness, light-headedness and headaches.       Objective:   Physical Exam  Constitutional: She appears well-developed and well-nourished.  Neck: Neck supple. No thyromegaly present.  Cardiovascular: Normal rate and regular rhythm.   Pulmonary/Chest:  Effort normal and breath sounds normal. No respiratory distress. She has no wheezes. She has no rales.  Musculoskeletal: She exhibits no edema.          Assessment & Plan:  #1 hypothyroidism. Recheck TSH. Continue Synthroid. Adjust dosage as necessary #2 osteopenia. She takes calcium and vitamin D. Continue regular exercise. Consider repeat DEXA scan this year though she is undecided at this point. She has declined considerarion for other osteoporosis medications #3 hypertension which is stable and at goal. Check basic metabolic panel

## 2014-06-01 DIAGNOSIS — H353190 Nonexudative age-related macular degeneration, unspecified eye, stage unspecified: Secondary | ICD-10-CM | POA: Diagnosis not present

## 2014-06-01 DIAGNOSIS — H43819 Vitreous degeneration, unspecified eye: Secondary | ICD-10-CM | POA: Diagnosis not present

## 2014-06-07 ENCOUNTER — Other Ambulatory Visit: Payer: Self-pay | Admitting: Family Medicine

## 2014-06-14 ENCOUNTER — Other Ambulatory Visit: Payer: Self-pay | Admitting: Family Medicine

## 2014-06-23 DIAGNOSIS — H04129 Dry eye syndrome of unspecified lacrimal gland: Secondary | ICD-10-CM | POA: Diagnosis not present

## 2014-06-23 DIAGNOSIS — Z961 Presence of intraocular lens: Secondary | ICD-10-CM | POA: Diagnosis not present

## 2014-06-23 DIAGNOSIS — H353190 Nonexudative age-related macular degeneration, unspecified eye, stage unspecified: Secondary | ICD-10-CM | POA: Diagnosis not present

## 2014-06-30 DIAGNOSIS — M25569 Pain in unspecified knee: Secondary | ICD-10-CM | POA: Diagnosis not present

## 2014-06-30 DIAGNOSIS — Z96649 Presence of unspecified artificial hip joint: Secondary | ICD-10-CM | POA: Diagnosis not present

## 2014-06-30 DIAGNOSIS — M79609 Pain in unspecified limb: Secondary | ICD-10-CM | POA: Diagnosis not present

## 2014-07-05 ENCOUNTER — Encounter: Payer: Self-pay | Admitting: Gastroenterology

## 2014-07-08 ENCOUNTER — Telehealth: Payer: Self-pay

## 2014-07-08 NOTE — Telephone Encounter (Signed)
appt scheduled

## 2014-07-08 NOTE — Telephone Encounter (Signed)
Can you please schedule patient for a Pre-Op visit 30 min. With EKG. Thank you

## 2014-07-12 ENCOUNTER — Other Ambulatory Visit: Payer: Self-pay

## 2014-07-12 DIAGNOSIS — Z1231 Encounter for screening mammogram for malignant neoplasm of breast: Secondary | ICD-10-CM

## 2014-07-13 ENCOUNTER — Ambulatory Visit
Admission: RE | Admit: 2014-07-13 | Discharge: 2014-07-13 | Disposition: A | Discharge status: Home / Self Care | Payer: Medicare Other | Source: Ambulatory Visit

## 2014-07-13 DIAGNOSIS — Z1231 Encounter for screening mammogram for malignant neoplasm of breast: Secondary | ICD-10-CM | POA: Diagnosis not present

## 2014-07-20 ENCOUNTER — Encounter (HOSPITAL_COMMUNITY): Payer: Self-pay | Admitting: Pharmacy Technician

## 2014-07-21 ENCOUNTER — Other Ambulatory Visit: Payer: Self-pay

## 2014-07-21 ENCOUNTER — Encounter (HOSPITAL_COMMUNITY)
Admission: RE | Admit: 2014-07-21 | Discharge: 2014-07-21 | Disposition: A | Discharge status: Home / Self Care | Payer: Medicare Other | Source: Ambulatory Visit | Attending: Orthopedic Surgery | Admitting: Orthopedic Surgery

## 2014-07-21 ENCOUNTER — Encounter (HOSPITAL_COMMUNITY): Payer: Self-pay

## 2014-07-21 ENCOUNTER — Ambulatory Visit (HOSPITAL_COMMUNITY)
Admission: RE | Admit: 2014-07-21 | Discharge: 2014-07-21 | Disposition: A | Discharge status: Home / Self Care | Payer: Medicare Other | Source: Ambulatory Visit | Attending: Anesthesiology | Admitting: Anesthesiology

## 2014-07-21 DIAGNOSIS — I10 Essential (primary) hypertension: Secondary | ICD-10-CM | POA: Insufficient documentation

## 2014-07-21 DIAGNOSIS — M1610 Unilateral primary osteoarthritis, unspecified hip: Secondary | ICD-10-CM | POA: Insufficient documentation

## 2014-07-21 DIAGNOSIS — R0602 Shortness of breath: Secondary | ICD-10-CM | POA: Diagnosis not present

## 2014-07-21 DIAGNOSIS — Z01818 Encounter for other preprocedural examination: Secondary | ICD-10-CM | POA: Diagnosis not present

## 2014-07-21 HISTORY — DX: Sleep disorder, unspecified: G47.9

## 2014-07-21 HISTORY — DX: Other complications of anesthesia, initial encounter: T88.59XA

## 2014-07-21 HISTORY — DX: Other specified disorders of bone density and structure, unspecified site: M85.80

## 2014-07-21 HISTORY — DX: Adverse effect of unspecified anesthetic, initial encounter: T41.45XA

## 2014-07-21 HISTORY — DX: Diverticulosis of intestine, part unspecified, without perforation or abscess without bleeding: K57.90

## 2014-07-21 HISTORY — DX: Shortness of breath: R06.02

## 2014-07-21 HISTORY — DX: Personal history of other infectious and parasitic diseases: Z86.19

## 2014-07-21 HISTORY — DX: Other specified postprocedural states: Z98.890

## 2014-07-21 HISTORY — DX: Gastro-esophageal reflux disease without esophagitis: K21.9

## 2014-07-21 HISTORY — DX: Disorder of the skin and subcutaneous tissue, unspecified: L98.9

## 2014-07-21 HISTORY — DX: Nausea with vomiting, unspecified: R11.2

## 2014-07-21 LAB — CBC
HCT: 39.9 % (ref 36.0–46.0)
Hemoglobin: 13.2 g/dL (ref 12.0–15.0)
MCH: 30.1 pg (ref 26.0–34.0)
MCHC: 33.1 g/dL (ref 30.0–36.0)
MCV: 91.1 fL (ref 78.0–100.0)
PLATELETS: 239 10*3/uL (ref 150–400)
RBC: 4.38 MIL/uL (ref 3.87–5.11)
RDW: 14 % (ref 11.5–15.5)
WBC: 5.5 10*3/uL (ref 4.0–10.5)

## 2014-07-21 LAB — BASIC METABOLIC PANEL
Anion gap: 12 (ref 5–15)
BUN: 12 mg/dL (ref 6–23)
CALCIUM: 9.2 mg/dL (ref 8.4–10.5)
CO2: 26 mEq/L (ref 19–32)
CREATININE: 0.54 mg/dL (ref 0.50–1.10)
Chloride: 91 mEq/L — ABNORMAL LOW (ref 96–112)
GFR, EST NON AFRICAN AMERICAN: 86 mL/min — AB (ref >90)
Glucose, Bld: 81 mg/dL (ref 70–99)
POTASSIUM: 4.7 meq/L (ref 3.7–5.3)
Sodium: 129 mEq/L — ABNORMAL LOW (ref 137–147)

## 2014-07-21 LAB — URINALYSIS, ROUTINE W REFLEX MICROSCOPIC
Bilirubin Urine: NEGATIVE
GLUCOSE, UA: NEGATIVE mg/dL
HGB URINE DIPSTICK: NEGATIVE
KETONES UR: NEGATIVE mg/dL
Nitrite: NEGATIVE
Protein, ur: NEGATIVE mg/dL
Specific Gravity, Urine: 1.007 (ref 1.005–1.030)
Urobilinogen, UA: 0.2 mg/dL (ref 0.0–1.0)
pH: 7 (ref 5.0–8.0)

## 2014-07-21 LAB — SURGICAL PCR SCREEN
MRSA, PCR: NEGATIVE
Staphylococcus aureus: NEGATIVE

## 2014-07-21 LAB — APTT: aPTT: 30 seconds (ref 24–37)

## 2014-07-21 LAB — PROTIME-INR
INR: 0.98 (ref 0.00–1.49)
Prothrombin Time: 13.1 seconds (ref 11.6–15.2)

## 2014-07-21 LAB — URINE MICROSCOPIC-ADD ON

## 2014-07-21 NOTE — Progress Notes (Signed)
Abnormal UA faxed to Dr.Olin 

## 2014-07-21 NOTE — Patient Instructions (Addendum)
Carolyn Henderson  07/21/2014                           YOUR PROCEDURE IS SCHEDULED ON: 08/02/14               ENTER THRU [facility].  ENTRANCE AND                            FOLLOW  SIGNS TO SHORT STAY CENTER                 ARRIVE AT SHORT STAY AT: 12:00 PM               CALL THIS NUMBER IF ANY PROBLEMS THE DAY OF SURGERY :               [phone]                                REMEMBER:   Do not eat food  AFTER MIDNIGHT             MAY HAVE CLEAR LIQUIDS UNTIL 9:00 AM                                                     CLEAR LIQUID DIET   Foods Allowed                                                                     Foods Excluded  Coffee and tea, regular and decaf                             liquids that you cannot  Plain Jell-O in any flavor                                             see through such as: Fruit ices (not with fruit pulp)                                     milk, soups, orange juice  Iced Popsicles                                    All solid food Carbonated beverages, regular and diet                                    Cranberry, grape and apple juices Sports drinks like Gatorade Lightly seasoned clear broth or consume(fat free) Sugar, honey syrup  _____________________________________________________________________  Take these medicines the morning of surgery with               A SIPS OF WATER :   SYNTHROID / CLARITIN / GABAPENTIN      Do not wear jewelry, make-up   Do not wear lotions, powders, or perfumes.   Do not shave legs or underarms 12 hrs. before surgery (men may shave face)  Do not bring valuables to the hospital.  Contacts, dentures or bridgework may not be worn into surgery.  Leave suitcase in the car. After surgery it may be brought to your room.  For patients admitted to the hospital more than one night, checkout time is            11:00 AM                                                        The day of discharge.   Patients discharged the day of surgery will not be allowed to drive home.            If going home same day of surgery, must have someone stay with you              FIRST 24 hrs at home and arrange for some one to drive you              home from hospital.   ________________________________________________________________________                                                                                                  Carolyn Henderson  Before surgery, you can play an important role.  Because skin is not sterile, your skin needs to be as free of germs as possible.  You can reduce the number of germs on your skin by washing with CHG (chlorahexidine gluconate) soap before surgery.  CHG is an antiseptic cleaner which kills germs and bonds with the skin to continue killing germs even after washing. Please DO NOT use if you have an allergy to CHG or antibacterial soaps.  If your skin becomes reddened/irritated stop using the CHG and inform your nurse when you arrive at Short Stay. Do not shave (including legs and underarms) for at least 48 hours prior to the first CHG shower.  You may shave your face. Please follow these instructions carefully:   1.  Shower with CHG Soap the night before surgery and the  morning of Surgery.   2.  If you choose to wash your hair, wash your hair first as usual with your  normal  Shampoo.   3.  After you shampoo, rinse your hair and body thoroughly to remove the  shampoo.  4.  Use CHG as you would any other liquid soap.  You can apply chg directly  to the skin and wash . Gently wash with scrungie or clean wascloth    5.  Apply the CHG Soap to your body ONLY FROM THE NECK DOWN.   Do not use on open                           Wound or open sores. Avoid contact with eyes, ears mouth and genitals (private parts).                        Genitals (private parts) with  your normal soap.              6.  Wash thoroughly, paying special attention to the area where your surgery  will be performed.   7.  Thoroughly rinse your body with warm water from the neck down.   8.  DO NOT shower/wash with your normal soap after using and rinsing off  the CHG Soap .                9.  Pat yourself dry with a clean towel.             10.  Wear clean pajamas.             11.  Place clean sheets on your bed the night of your first shower and do not  sleep with pets.  Day of Surgery : Do not apply any lotions/deodorants the morning of surgery.  Please wear clean clothes to the hospital/surgery center.  FAILURE TO FOLLOW THESE INSTRUCTIONS MAY RESULT IN THE CANCELLATION OF YOUR SURGERY    PATIENT SIGNATURE_________________________________  ______________________________________________________________________     Carolyn Henderson  An incentive spirometer is a tool that can help keep your lungs clear and active. This tool measures how well you are filling your lungs with each breath. Taking long deep breaths may help reverse or decrease the chance of developing breathing (pulmonary) problems (especially infection) following:  A long period of time when you are unable to move or be active. BEFORE THE PROCEDURE   If the spirometer includes an indicator to show your best effort, your nurse or respiratory therapist will set it to a desired goal.  If possible, sit up straight or lean slightly forward. Try not to slouch.  Hold the incentive spirometer in an upright position. INSTRUCTIONS FOR USE  1. Sit on the edge of your bed if possible, or sit up as far as you can in bed or on a chair. 2. Hold the incentive spirometer in an upright position. 3. Breathe out normally. 4. Place the mouthpiece in your mouth and seal your lips tightly around it. 5. Breathe in slowly and as deeply as possible, raising the piston or the ball toward the top of the column. 6. Hold  your breath for 3-5 seconds or for as long as possible. Allow the piston or ball to fall to the bottom of the column. 7. Remove the mouthpiece from your mouth and breathe out normally. 8. Rest for a few seconds and repeat Steps 1 through 7 at least 10 times every 1-2 hours when you are awake. Take your time and take a few normal breaths between deep breaths. 9. The spirometer may include an indicator to show your best effort. Use the indicator as a goal to work toward  during each repetition. 10. After each set of 10 deep breaths, practice coughing to be sure your lungs are clear. If you have an incision (the cut made at the time of surgery), support your incision when coughing by placing a pillow or rolled up towels firmly against it. Once you are able to get out of bed, walk around indoors and cough well. You may stop using the incentive spirometer when instructed by your caregiver.  RISKS AND COMPLICATIONS  Take your time so you do not get dizzy or light-headed.  If you are in pain, you may need to take or ask for pain medication before doing incentive spirometry. It is harder to take a deep breath if you are having pain. AFTER USE  Rest and breathe slowly and easily.  It can be helpful to keep track of a log of your progress. Your caregiver can provide you with a simple table to help with this. If you are using the spirometer at home, follow these instructions: [place] IF:   You are having difficultly using the spirometer.  You have trouble using the spirometer as often as instructed.  Your pain medication is not giving enough relief while using the spirometer.  You develop fever of 100.5 F (38.1 C) or higher. SEEK IMMEDIATE MEDICAL CARE IF:   You cough up bloody sputum that had not been present before.  You develop fever of 102 F (38.9 C) or greater.  You develop worsening pain at or near the incision site. MAKE SURE YOU:   Understand these instructions.  Will  watch your condition.  Will get help right away if you are not doing well or get worse. Document Released: 02/11/2007 Document Revised: 12/24/2011 Document Reviewed: 04/14/2007 [employer] Patient Information 2014 [employer], [place].   ________________________________________________________________________  WHAT IS A BLOOD TRANSFUSION? Blood Transfusion Information  A transfusion is the replacement of blood or some of its parts. Blood is made up of multiple cells which provide different functions.  Red blood cells carry oxygen and are used for blood loss replacement.  White blood cells fight against infection.  Platelets control bleeding.  Plasma helps clot blood.  Other blood products are available for specialized needs, such as hemophilia or other clotting disorders. BEFORE THE TRANSFUSION  Who gives blood for transfusions?   Healthy volunteers who are fully evaluated to make sure their blood is safe. This is blood bank blood. Transfusion therapy is the safest it has ever been in the practice of medicine. Before blood is taken from a donor, a complete history is taken to make sure that person has no history of diseases nor engages in risky social behavior (examples are intravenous drug use or sexual activity with multiple partners). The donor's travel history is screened to minimize risk of transmitting infections, such as malaria. The donated blood is tested for signs of infectious diseases, such as HIV and hepatitis. The blood is then tested to be sure it is compatible with you in order to minimize the chance of a transfusion reaction. If you or a relative donates blood, this is often done in anticipation of surgery and is not appropriate for emergency situations. It takes many days to process the donated blood. RISKS AND COMPLICATIONS Although transfusion therapy is very safe and saves many lives, the main dangers of transfusion include:   Getting an infectious disease.  Developing a  transfusion reaction. This is an allergic reaction to something in the blood you were given. Every precaution is taken to  prevent this. The decision to have a blood transfusion has been considered carefully by your caregiver before blood is given. Blood is not given unless the benefits outweigh the risks. AFTER THE TRANSFUSION  Right after receiving a blood transfusion, you will usually feel much better and more energetic. This is especially true if your red blood cells have gotten low (anemic). The transfusion raises the level of the red blood cells which carry oxygen, and this usually causes an energy increase.  The nurse administering the transfusion will monitor you carefully for complications. HOME CARE INSTRUCTIONS  No special instructions are needed after a transfusion. You may find your energy is better. Speak with your caregiver about any limitations on activity for underlying diseases you may have. SEEK MEDICAL CARE IF:   Your condition is not improving after your transfusion.  You develop redness or irritation at the intravenous (IV) site. SEEK IMMEDIATE MEDICAL CARE IF:  Any of the following symptoms occur over the next 12 hours:  Shaking chills.  You have a temperature by mouth above 102 F (38.9 C), not controlled by medicine.  Chest, back, or muscle pain.  People around you feel you are not acting correctly or are confused.  Shortness of breath or difficulty breathing.  Dizziness and fainting.  You get a rash or develop hives.  You have a decrease in urine output.  Your urine turns a dark color or changes to pink, red, or brown. Any of the following symptoms occur over the next 10 days:  You have a temperature by mouth above 102 F (38.9 C), not controlled by medicine.  Shortness of breath.  Weakness after normal activity.  The white part of the eye turns yellow (jaundice).  You have a decrease in the amount of urine or are urinating less often.  Your  urine turns a dark color or changes to pink, red, or brown. Document Released: 09/28/2000 Document Revised: 12/24/2011 Document Reviewed: 05/17/2008 [facility] Patient Information 2014 [place], [place].  _______________________________________________________________________

## 2014-07-23 NOTE — Progress Notes (Signed)
Received fax from Dr. Aurea Graff office - Rx Cipro called in for UTI

## 2014-07-26 ENCOUNTER — Ambulatory Visit (INDEPENDENT_AMBULATORY_CARE_PROVIDER_SITE_OTHER): Payer: Medicare Other | Admitting: Family Medicine

## 2014-07-26 ENCOUNTER — Encounter: Payer: Self-pay | Admitting: Family Medicine

## 2014-07-26 VITALS — BP 130/78 | HR 62 | Temp 97.3°F | Wt 154.0 lb

## 2014-07-26 DIAGNOSIS — Z23 Encounter for immunization: Secondary | ICD-10-CM | POA: Diagnosis not present

## 2014-07-26 DIAGNOSIS — Z01818 Encounter for other preprocedural examination: Secondary | ICD-10-CM | POA: Diagnosis not present

## 2014-07-26 NOTE — Progress Notes (Signed)
Pre visit review using our clinic review tool, if applicable. No additional management support is needed unless otherwise documented below in the visit note. 

## 2014-07-26 NOTE — Progress Notes (Signed)
Subjective:    Patient ID: Carolyn Henderson, female    DOB: [birth year], 80 y.o.   MRN: [id number]  HPI  Patient here for preoperative assessment. Right total hip  replacement one week from today. She's had previous left hip and left knee replacements and has also had remote history of right hip replacement and this will be a redo. She has not had any major concerns recently. Her chronic problems include history of hypothyroidism, hypertension, and hyperlipidemia. Blood pressures been stable recently. She had EKG done on 07/21/2014 which showed heart rate around 56 with incomplete right bundle branch block. Right bundle branch block was new since previous tracing back in 2012. No acute changes otherwise.  Patient has had no history of cerebrovascular disease. No peripheral vascular disease. Nonsmoker. No recent chest pains.preoperative labs were reviewed. She did have some leukocytes and was treated empirically with ciprofloxacin but she's not had any recent urinary symptoms. MRSA screening negative.  Past Medical History  Diagnosis Date  . Hypertension   . Hyperlipidemia   . Thyroid disease   . Arthritis   . Complication of anesthesia   . PONV (postoperative nausea and vomiting)   . Shortness of breath     WITH EXERTION  . Osteopenia   . Precancerous skin lesion   . Diverticulosis   . GERD (gastroesophageal reflux disease)     ONLY OCCASIONAL  . Difficulty sleeping     TAKES TYLENOL PM NIGHTLY  . History of shingles    Past Surgical History  Procedure Laterality Date  . Cataract extraction    . Total hip arthroplasty  1999 / 2007    BILATERAL  . Abdominal hysterectomy    . Lumbar laminectomy    . Tonsillectomy    . Colon surgery  1983    COLON RESECTION  . Back surgery    . Joint replacement      TOTAL KNEE 2012 / BIL TOTAL HIPS     reports that she quit smoking about 22 years ago. She does not have any smokeless tobacco history on file. She reports that she does not drink  alcohol or use illicit drugs. family history includes Cancer in an other family member; Hypertension in an other family member. Allergies  Allergen Reactions  . Cephalosporins     Cant remember  . Codeine Sulfate Nausea And Vomiting  . Hydrocodone     Sores in mouth, itiching  . Penicillins Rash     Review of Systems  Constitutional: Negative for fever, chills and unexpected weight change.  Eyes: Negative for visual disturbance.  Respiratory: Negative for cough, chest tightness, shortness of breath and wheezing.   Cardiovascular: Negative for chest pain, palpitations and leg swelling.  Gastrointestinal: Negative for abdominal pain.  Endocrine: Negative for polydipsia and polyuria.  Genitourinary: Negative for dysuria.  Neurological: Negative for dizziness, seizures, syncope, weakness, light-headedness and headaches.  Psychiatric/Behavioral: Negative for dysphoric mood.       Objective:   Physical Exam  Constitutional: She is oriented to person, place, and time. She appears well-developed and well-nourished.  HENT:  Right Ear: External ear normal.  Left Ear: External ear normal.  Neck: Neck supple. No thyromegaly present.  No carotid bruits  Cardiovascular: Normal rate and regular rhythm.   Pulmonary/Chest: Effort normal and breath sounds normal. No respiratory distress. She has no wheezes. She has no rales.  Musculoskeletal: She exhibits no edema.  Neurological: She is alert and oriented to person, place, and time.  Psychiatric:  She has a normal mood and affect. Her behavior is normal.         Assessment & Plan:  presurgical assessment. Recent labs and EKG reviewed. She has new right bundle-branch block which we explained is not significant for any increased risk. She's not had any worrisome symptoms and nonfocal exam. We did not see any contraindications for surgery this time. Flu vaccine and Prevnar 13 given

## 2014-08-01 NOTE — H&P (Signed)
TOTAL HIP REVISION ADMISSION H&P  Patient is admitted for right revision total hip arthroplasty.  Subjective:  Chief Complaint:     Failed right hip arthroplasty  HPI: Carolyn Henderson, 80 y.o. female, has a history of pain and functional disability in the right hip due to failed right THA with polyethylene wear and patient has failed non-surgical conservative treatments for greater than 12 weeks to include NSAID's and/or analgesics, use of assistive devices and activity modification. The indications for the revision total hip arthroplasty are bearing surface wear leading to  symptomatic synovitis and implant or hip misalignment.  Onset of symptoms was gradual starting a couple of months ago with rapidlly worsening course since that time.  Prior procedures on the right hip include arthroplasty.  Patient currently rates pain in the right hip at 8 out of 10 with activity.  There is night pain, worsening of pain with activity and weight bearing, trendelenberg gait, pain that interfers with activities of daily living and pain with passive range of motion. Patient has evidence of polyethylene wear by imaging studies.  This condition presents safety issues increasing the risk of falls.   There is no current active infection.   Risks, benefits and expectations were discussed with the patient.  Risks including but not limited to the risk of anesthesia, blood clots, nerve damage, blood vessel damage, failure of the prosthesis, infection and up to and including death.  Patient understand the risks, benefits and expectations and wishes to proceed with surgery.  PCP: Eulas Post, MD  D/C Plans:      SNF [facility])  Post-op Meds:       No Rx given  Tranexamic Acid:      To be given - IV    Decadron:      Is to be given  FYI:     ASA post-op  Norco post-op   Patient Active Problem List   Diagnosis Date Noted  . Osteopenia 02/07/2012  . CHRONIC LARYNGITIS 09/26/2010  . DYSPRAXIA 05/04/2010   . WEAKNESS 05/04/2010  . 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 SHINGLES 01/23/2010  . ECZEMA 09/12/2009  . UNSPECIFIED HYPOTHYROIDISM 03/10/2009  . OSTEOARTHRITIS 03/10/2009  . HYPERLIPIDEMIA 03/04/2009  . HYPERTENSION 03/04/2009   Past Medical History  Diagnosis Date  . Hypertension   . Hyperlipidemia   . Thyroid disease   . Arthritis   . Complication of anesthesia   . PONV (postoperative nausea and vomiting)   . Shortness of breath     WITH EXERTION  . Osteopenia   . Precancerous skin lesion   . Diverticulosis   . GERD (gastroesophageal reflux disease)     ONLY OCCASIONAL  . Difficulty sleeping     TAKES TYLENOL PM NIGHTLY  . History of shingles     Past Surgical History  Procedure Laterality Date  . Cataract extraction    . Total hip arthroplasty  1999 / 2007    BILATERAL  . Abdominal hysterectomy    . Lumbar laminectomy    . Tonsillectomy    . Colon surgery  1983    COLON RESECTION  . Back surgery    . Joint replacement      TOTAL KNEE 2012 / BIL TOTAL HIPS     No prescriptions prior to admission   Allergies  Allergen Reactions  . Cephalosporins     Cant remember  . Codeine Sulfate Nausea And Vomiting  . Hydrocodone     Sores in mouth, itiching  . Penicillins  Rash    History  Substance Use Topics  . Smoking status: Former Smoker    Quit date: 07/21/1992  . Smokeless tobacco: Not on file  . Alcohol Use: No    Family History  Problem Relation Age of Onset  . Hypertension      fhx  . Cancer      prostate/fhx      Review of Systems  HENT: Negative.   Eyes: Negative.   Respiratory: Positive for shortness of breath (on exertion).   Cardiovascular: Negative.   Gastrointestinal: Positive for heartburn.  Genitourinary: Negative.   Musculoskeletal: Positive for joint pain.  Skin: Positive for rash.  Neurological: Positive for weakness.  Endo/Heme/Allergies: Negative.   Psychiatric/Behavioral: Negative.     Objective:  Physical Exam   Constitutional: She is oriented to person, place, and time. She appears well-developed and well-nourished.  HENT:  Head: Normocephalic and atraumatic.  Eyes: Pupils are equal, round, and reactive to light.  Neck: Neck supple. No JVD present. No tracheal deviation present. No thyromegaly present.  Cardiovascular: Normal rate, regular rhythm, normal heart sounds and intact distal pulses.   Respiratory: Effort normal and breath sounds normal. No stridor. No respiratory distress. She has no wheezes.  GI: Soft. There is no tenderness. There is no guarding.  Musculoskeletal:       Right hip: She exhibits decreased range of motion, decreased strength, tenderness, bony tenderness and laceration (healed previous incision). She exhibits no swelling and no deformity.  Lymphadenopathy:    She has no cervical adenopathy.  Neurological: She is alert and oriented to person, place, and time.  Skin: Skin is warm and dry.  Psychiatric: She has a normal mood and affect.      Labs:  Estimated body mass index is 28.00 kg/(m^2) as calculated from the following:   Height as of 04/13/13: 5\' 3"  (1.6 m).   Weight as of 04/30/14: 71.668 kg (158 lb).  Imaging Review:  Plain radiographs demonstrate previous THA of the right hip(s). There is evidence of polyethylene wear.The bone quality appears to be good for age and reported activity level.   Assessment/Plan:  End stage arthritis, right hip(s) with failed previous arthroplasty.  The patient history, physical examination, clinical judgement of the provider and imaging studies are consistent with end stage degenerative joint disease of the right hip(s), previous total hip arthroplasty. Revision total hip arthroplasty is deemed medically necessary. The treatment options including medical management, injection therapy, arthroscopy and arthroplasty were discussed at length. The risks and benefits of total hip arthroplasty were presented and reviewed. The risks due  to aseptic loosening, infection, stiffness, dislocation/subluxation,  thromboembolic complications and other imponderables were discussed.  The patient acknowledged the explanation, agreed to proceed with the plan and consent was signed. Patient is being admitted for inpatient treatment for surgery, pain control, PT, OT, prophylactic antibiotics, VTE prophylaxis, progressive ambulation and ADL's and discharge planning. The patient is planning to be discharged to skilled nursing facility.      West Pugh Kimberley Dastrup   PA-C  08/01/2014, 11:32 AM

## 2014-08-02 ENCOUNTER — Encounter (HOSPITAL_COMMUNITY): Payer: Medicare Other | Admitting: Anesthesiology

## 2014-08-02 ENCOUNTER — Inpatient Hospital Stay (HOSPITAL_COMMUNITY): Payer: Medicare Other

## 2014-08-02 ENCOUNTER — Encounter (HOSPITAL_COMMUNITY): Payer: Self-pay | Admitting: *Deleted

## 2014-08-02 ENCOUNTER — Encounter (HOSPITAL_COMMUNITY)
Admission: RE | Disposition: A | Discharge status: Skilled Nursing Facility | Payer: Self-pay | Source: Ambulatory Visit | Attending: Orthopedic Surgery

## 2014-08-02 ENCOUNTER — Inpatient Hospital Stay (HOSPITAL_COMMUNITY)
Admission: RE | Admit: 2014-08-02 | Discharge: 2014-08-05 | DRG: 468 | Disposition: A | Discharge status: Skilled Nursing Facility | Payer: Medicare Other | Source: Ambulatory Visit | Attending: Orthopedic Surgery | Admitting: Orthopedic Surgery

## 2014-08-02 ENCOUNTER — Inpatient Hospital Stay (HOSPITAL_COMMUNITY): Payer: Medicare Other | Admitting: Anesthesiology

## 2014-08-02 DIAGNOSIS — Z881 Allergy status to other antibiotic agents status: Secondary | ICD-10-CM

## 2014-08-02 DIAGNOSIS — Z9849 Cataract extraction status, unspecified eye: Secondary | ICD-10-CM

## 2014-08-02 DIAGNOSIS — T84060S Wear of articular bearing surface of internal prosthetic right hip joint, sequela: Secondary | ICD-10-CM | POA: Diagnosis not present

## 2014-08-02 DIAGNOSIS — K5790 Diverticulosis of intestine, part unspecified, without perforation or abscess without bleeding: Secondary | ICD-10-CM | POA: Diagnosis not present

## 2014-08-02 DIAGNOSIS — M8580 Other specified disorders of bone density and structure, unspecified site: Secondary | ICD-10-CM | POA: Diagnosis present

## 2014-08-02 DIAGNOSIS — T84060A Wear of articular bearing surface of internal prosthetic right hip joint, initial encounter: Secondary | ICD-10-CM | POA: Diagnosis not present

## 2014-08-02 DIAGNOSIS — M1611 Unilateral primary osteoarthritis, right hip: Secondary | ICD-10-CM | POA: Diagnosis present

## 2014-08-02 DIAGNOSIS — Z87891 Personal history of nicotine dependence: Secondary | ICD-10-CM

## 2014-08-02 DIAGNOSIS — M859 Disorder of bone density and structure, unspecified: Secondary | ICD-10-CM | POA: Diagnosis not present

## 2014-08-02 DIAGNOSIS — K219 Gastro-esophageal reflux disease without esophagitis: Secondary | ICD-10-CM | POA: Diagnosis present

## 2014-08-02 DIAGNOSIS — I10 Essential (primary) hypertension: Secondary | ICD-10-CM | POA: Diagnosis not present

## 2014-08-02 DIAGNOSIS — Z8249 Family history of ischemic heart disease and other diseases of the circulatory system: Secondary | ICD-10-CM | POA: Diagnosis not present

## 2014-08-02 DIAGNOSIS — E039 Hypothyroidism, unspecified: Secondary | ICD-10-CM | POA: Diagnosis present

## 2014-08-02 DIAGNOSIS — R0602 Shortness of breath: Secondary | ICD-10-CM | POA: Diagnosis not present

## 2014-08-02 DIAGNOSIS — T84090A Other mechanical complication of internal right hip prosthesis, initial encounter: Secondary | ICD-10-CM | POA: Diagnosis not present

## 2014-08-02 DIAGNOSIS — Z96649 Presence of unspecified artificial hip joint: Secondary | ICD-10-CM

## 2014-08-02 DIAGNOSIS — M25551 Pain in right hip: Secondary | ICD-10-CM | POA: Diagnosis not present

## 2014-08-02 DIAGNOSIS — Z880 Allergy status to penicillin: Secondary | ICD-10-CM | POA: Diagnosis not present

## 2014-08-02 DIAGNOSIS — M1990 Unspecified osteoarthritis, unspecified site: Secondary | ICD-10-CM | POA: Diagnosis not present

## 2014-08-02 DIAGNOSIS — E079 Disorder of thyroid, unspecified: Secondary | ICD-10-CM | POA: Diagnosis not present

## 2014-08-02 DIAGNOSIS — E785 Hyperlipidemia, unspecified: Secondary | ICD-10-CM | POA: Diagnosis present

## 2014-08-02 DIAGNOSIS — Z885 Allergy status to narcotic agent status: Secondary | ICD-10-CM

## 2014-08-02 DIAGNOSIS — Z9181 History of falling: Secondary | ICD-10-CM | POA: Diagnosis not present

## 2014-08-02 DIAGNOSIS — Z8619 Personal history of other infectious and parasitic diseases: Secondary | ICD-10-CM

## 2014-08-02 DIAGNOSIS — Y831 Surgical operation with implant of artificial internal device as the cause of abnormal reaction of the patient, or of later complication, without mention of misadventure at the time of the procedure: Secondary | ICD-10-CM | POA: Diagnosis present

## 2014-08-02 DIAGNOSIS — Z96641 Presence of right artificial hip joint: Secondary | ICD-10-CM | POA: Diagnosis not present

## 2014-08-02 DIAGNOSIS — Z4789 Encounter for other orthopedic aftercare: Secondary | ICD-10-CM | POA: Diagnosis not present

## 2014-08-02 HISTORY — PX: TOTAL HIP REVISION: SHX763

## 2014-08-02 SURGERY — TOTAL HIP REVISION
Anesthesia: Spinal | Site: Hip | Laterality: Right

## 2014-08-02 MED ORDER — PHENYLEPHRINE HCL 10 MG/ML IJ SOLN
10.0000 mg | INTRAVENOUS | Status: DC | PRN
  Administered 2014-08-02: 40 ug/min via INTRAVENOUS

## 2014-08-02 MED ORDER — AMLODIPINE BESYLATE 2.5 MG PO TABS
2.5000 mg | ORAL_TABLET | Freq: Every evening | ORAL | Status: DC
  Administered 2014-08-02: 2.5 mg via ORAL
  Filled 2014-08-02 (×5): qty 1

## 2014-08-02 MED ORDER — BISACODYL 10 MG RE SUPP: 10.0000 mg | Freq: Every day | RECTAL | Status: DC | PRN

## 2014-08-02 MED ORDER — METOCLOPRAMIDE HCL 5 MG/ML IJ SOLN: 5.0000 mg | Freq: Three times a day (TID) | INTRAMUSCULAR | Status: DC | PRN

## 2014-08-02 MED ORDER — HYDROMORPHONE HCL 1 MG/ML IJ SOLN
0.5000 mg | INTRAMUSCULAR | Status: DC | PRN
  Administered 2014-08-03: 1 mg via INTRAVENOUS
  Filled 2014-08-02: qty 1

## 2014-08-02 MED ORDER — HYDROCHLOROTHIAZIDE 25 MG PO TABS
25.0000 mg | ORAL_TABLET | Freq: Every morning | ORAL | Status: DC
  Administered 2014-08-03 – 2014-08-05 (×2): 25 mg via ORAL
  Filled 2014-08-02 (×4): qty 1

## 2014-08-02 MED ORDER — DIPHENHYDRAMINE HCL 25 MG PO CAPS
25.0000 mg | ORAL_CAPSULE | Freq: Four times a day (QID) | ORAL | Status: DC | PRN
  Administered 2014-08-03: 25 mg via ORAL
  Filled 2014-08-02: qty 1

## 2014-08-02 MED ORDER — LEVOTHYROXINE SODIUM 112 MCG PO TABS
112.0000 ug | ORAL_TABLET | Freq: Every day | ORAL | Status: DC
  Administered 2014-08-03 – 2014-08-05 (×3): 112 ug via ORAL
  Filled 2014-08-02 (×5): qty 1

## 2014-08-02 MED ORDER — FENTANYL CITRATE 0.05 MG/ML IJ SOLN
INTRAMUSCULAR | Status: DC | PRN
  Administered 2014-08-02: 1 ug via INTRAVENOUS

## 2014-08-02 MED ORDER — METOCLOPRAMIDE HCL 10 MG PO TABS: 5.0000 mg | ORAL_TABLET | Freq: Three times a day (TID) | ORAL | Status: DC | PRN

## 2014-08-02 MED ORDER — ONDANSETRON HCL 4 MG/2ML IJ SOLN: 4.0000 mg | Freq: Four times a day (QID) | INTRAMUSCULAR | Status: DC | PRN

## 2014-08-02 MED ORDER — ACETAMINOPHEN 650 MG RE SUPP: 650.0000 mg | Freq: Four times a day (QID) | RECTAL | Status: DC | PRN

## 2014-08-02 MED ORDER — BUPIVACAINE HCL (PF) 0.5 % IJ SOLN
INTRAMUSCULAR | Status: AC
  Filled 2014-08-02: qty 30

## 2014-08-02 MED ORDER — CLINDAMYCIN PHOSPHATE 900 MG/50ML IV SOLN
900.0000 mg | INTRAVENOUS | Status: AC
Start: 2014-08-02 — End: 2014-08-02
  Administered 2014-08-02: 900 mg via INTRAVENOUS

## 2014-08-02 MED ORDER — ONDANSETRON HCL 4 MG PO TABS
4.0000 mg | ORAL_TABLET | Freq: Four times a day (QID) | ORAL | Status: DC | PRN
Start: 2014-08-02 — End: 2014-08-05

## 2014-08-02 MED ORDER — METHOCARBAMOL 1000 MG/10ML IJ SOLN
500.0000 mg | Freq: Four times a day (QID) | INTRAVENOUS | Status: DC | PRN
  Administered 2014-08-02: 500 mg via INTRAVENOUS
  Filled 2014-08-02: qty 5

## 2014-08-02 MED ORDER — PROPOFOL INFUSION 10 MG/ML OPTIME
INTRAVENOUS | Status: DC | PRN
  Administered 2014-08-02: 75 ug/kg/min via INTRAVENOUS

## 2014-08-02 MED ORDER — FENTANYL CITRATE 0.05 MG/ML IJ SOLN
INTRAMUSCULAR | Status: AC
  Filled 2014-08-02: qty 2

## 2014-08-02 MED ORDER — LACTATED RINGERS IV SOLN
INTRAVENOUS | Status: DC
  Administered 2014-08-02: 1000 mL via INTRAVENOUS

## 2014-08-02 MED ORDER — LACTATED RINGERS IV SOLN
INTRAVENOUS | Status: DC | PRN
  Administered 2014-08-02 (×2): via INTRAVENOUS

## 2014-08-02 MED ORDER — METHOCARBAMOL 500 MG PO TABS
500.0000 mg | ORAL_TABLET | Freq: Four times a day (QID) | ORAL | Status: DC | PRN
  Administered 2014-08-03: 500 mg via ORAL
  Filled 2014-08-02 (×2): qty 1

## 2014-08-02 MED ORDER — ALUM & MAG HYDROXIDE-SIMETH 200-200-20 MG/5ML PO SUSP
30.0000 mL | ORAL | Status: DC | PRN
  Administered 2014-08-03 – 2014-08-04 (×2): 30 mL via ORAL
  Filled 2014-08-02 (×2): qty 30

## 2014-08-02 MED ORDER — TRANEXAMIC ACID 100 MG/ML IV SOLN
1000.0000 mg | Freq: Once | INTRAVENOUS | Status: AC
  Administered 2014-08-02: 1000 mg via INTRAVENOUS
  Filled 2014-08-02: qty 10

## 2014-08-02 MED ORDER — GABAPENTIN 300 MG PO CAPS
300.0000 mg | ORAL_CAPSULE | Freq: Two times a day (BID) | ORAL | Status: DC
  Administered 2014-08-02 – 2014-08-05 (×6): 300 mg via ORAL
  Filled 2014-08-02 (×7): qty 1

## 2014-08-02 MED ORDER — DEXAMETHASONE SODIUM PHOSPHATE 10 MG/ML IJ SOLN
10.0000 mg | Freq: Once | INTRAMUSCULAR | Status: AC
  Administered 2014-08-03: 10 mg via INTRAVENOUS
  Filled 2014-08-02: qty 1

## 2014-08-02 MED ORDER — LACTATED RINGERS IV SOLN: INTRAVENOUS | Status: DC

## 2014-08-02 MED ORDER — SODIUM CHLORIDE 0.9 % IV SOLN
100.0000 mL/h | INTRAVENOUS | Status: DC
  Administered 2014-08-02: 100 mL/h via INTRAVENOUS
  Filled 2014-08-02 (×6): qty 1000

## 2014-08-02 MED ORDER — CLOBETASOL PROPIONATE 0.05 % EX OINT: 1.0000 "application " | TOPICAL_OINTMENT | Freq: Two times a day (BID) | CUTANEOUS | Status: DC | PRN

## 2014-08-02 MED ORDER — DOCUSATE SODIUM 100 MG PO CAPS
100.0000 mg | ORAL_CAPSULE | Freq: Two times a day (BID) | ORAL | Status: DC
  Administered 2014-08-02 – 2014-08-05 (×5): 100 mg via ORAL

## 2014-08-02 MED ORDER — CLINDAMYCIN PHOSPHATE 900 MG/50ML IV SOLN
INTRAVENOUS | Status: AC
  Filled 2014-08-02: qty 50

## 2014-08-02 MED ORDER — TRAMADOL HCL 50 MG PO TABS
50.0000 mg | ORAL_TABLET | Freq: Four times a day (QID) | ORAL | Status: DC
  Administered 2014-08-02 – 2014-08-03 (×3): 50 mg via ORAL
  Administered 2014-08-03 – 2014-08-04 (×6): 100 mg via ORAL
  Administered 2014-08-05: 50 mg via ORAL
  Administered 2014-08-05: 100 mg via ORAL
  Filled 2014-08-02 (×5): qty 2
  Filled 2014-08-02: qty 1
  Filled 2014-08-02 (×2): qty 2
  Filled 2014-08-02 (×2): qty 1
  Filled 2014-08-02: qty 2

## 2014-08-02 MED ORDER — FERROUS SULFATE 325 (65 FE) MG PO TABS
325.0000 mg | ORAL_TABLET | Freq: Three times a day (TID) | ORAL | Status: DC
  Administered 2014-08-03 – 2014-08-05 (×6): 325 mg via ORAL
  Filled 2014-08-02 (×10): qty 1

## 2014-08-02 MED ORDER — DEXAMETHASONE SODIUM PHOSPHATE 10 MG/ML IJ SOLN
10.0000 mg | Freq: Once | INTRAMUSCULAR | Status: AC
  Administered 2014-08-02: 10 mg via INTRAVENOUS

## 2014-08-02 MED ORDER — PROPOFOL 10 MG/ML IV BOLUS
INTRAVENOUS | Status: AC
  Filled 2014-08-02: qty 20

## 2014-08-02 MED ORDER — POLYETHYLENE GLYCOL 3350 17 G PO PACK
17.0000 g | PACK | Freq: Two times a day (BID) | ORAL | Status: DC
  Administered 2014-08-02 – 2014-08-04 (×4): 17 g via ORAL

## 2014-08-02 MED ORDER — 0.9 % SODIUM CHLORIDE (POUR BTL) OPTIME
TOPICAL | Status: DC | PRN
  Administered 2014-08-02: 1000 mL

## 2014-08-02 MED ORDER — LORATADINE 10 MG PO TABS
10.0000 mg | ORAL_TABLET | Freq: Every day | ORAL | Status: DC
  Administered 2014-08-03 – 2014-08-05 (×3): 10 mg via ORAL
  Filled 2014-08-02 (×4): qty 1

## 2014-08-02 MED ORDER — HYDROMORPHONE HCL 1 MG/ML IJ SOLN: 0.2500 mg | INTRAMUSCULAR | Status: DC | PRN

## 2014-08-02 MED ORDER — CELECOXIB 200 MG PO CAPS
200.0000 mg | ORAL_CAPSULE | Freq: Two times a day (BID) | ORAL | Status: DC
  Administered 2014-08-02 – 2014-08-05 (×6): 200 mg via ORAL
  Filled 2014-08-02 (×7): qty 1

## 2014-08-02 MED ORDER — MENTHOL 3 MG MT LOZG
1.0000 | LOZENGE | OROMUCOSAL | Status: DC | PRN
  Filled 2014-08-02: qty 9

## 2014-08-02 MED ORDER — PROPOFOL 10 MG/ML IV BOLUS
INTRAVENOUS | Status: AC
Start: 2014-08-02 — End: 2014-08-02
  Filled 2014-08-02: qty 20

## 2014-08-02 MED ORDER — PHENOL 1.4 % MT LIQD: 1.0000 | OROMUCOSAL | Status: DC | PRN

## 2014-08-02 MED ORDER — LIDOCAINE HCL 1 % IJ SOLN
INTRAMUSCULAR | Status: AC
  Filled 2014-08-02: qty 20

## 2014-08-02 MED ORDER — ASPIRIN EC 325 MG PO TBEC
325.0000 mg | DELAYED_RELEASE_TABLET | Freq: Two times a day (BID) | ORAL | Status: DC
  Administered 2014-08-03 – 2014-08-05 (×5): 325 mg via ORAL
  Filled 2014-08-02 (×8): qty 1

## 2014-08-02 MED ORDER — SODIUM CHLORIDE 0.9 % IR SOLN
Status: DC | PRN
  Administered 2014-08-02: 1000 mL

## 2014-08-02 MED ORDER — ONDANSETRON HCL 4 MG/2ML IJ SOLN
INTRAMUSCULAR | Status: AC
  Filled 2014-08-02: qty 2

## 2014-08-02 MED ORDER — CHLORHEXIDINE GLUCONATE 4 % EX LIQD: 60.0000 mL | Freq: Once | CUTANEOUS | Status: DC

## 2014-08-02 MED ORDER — PROPOFOL 10 MG/ML IV BOLUS
INTRAVENOUS | Status: DC | PRN
  Administered 2014-08-02 (×2): 20 mg via INTRAVENOUS

## 2014-08-02 MED ORDER — MAGNESIUM CITRATE PO SOLN: 1.0000 | Freq: Once | ORAL | Status: AC | PRN

## 2014-08-02 MED ORDER — BUPIVACAINE HCL (PF) 0.5 % IJ SOLN
INTRAMUSCULAR | Status: DC | PRN
  Administered 2014-08-02: 3 mL

## 2014-08-02 MED ORDER — CLINDAMYCIN PHOSPHATE 600 MG/50ML IV SOLN
600.0000 mg | Freq: Four times a day (QID) | INTRAVENOUS | Status: AC
  Administered 2014-08-02 – 2014-08-03 (×2): 600 mg via INTRAVENOUS
  Filled 2014-08-02 (×2): qty 50

## 2014-08-02 MED ORDER — ACETAMINOPHEN 325 MG PO TABS: 650.0000 mg | ORAL_TABLET | Freq: Four times a day (QID) | ORAL | Status: DC | PRN

## 2014-08-02 SURGICAL SUPPLY — 56 items
ARTICULEZE HEAD (Hips) ×3 IMPLANT
BAG ZIPLOCK 12X15 (MISCELLANEOUS) ×3 IMPLANT
BLADE SAW SGTL 18X1.27X75 (BLADE) ×2 IMPLANT
BLADE SAW SGTL 18X1.27X75MM (BLADE) ×1
BRUSH FEMORAL CANAL (MISCELLANEOUS) IMPLANT
CAPT HIP PINN CUP W/POLY LNR ×3 IMPLANT
DERMABOND ADVANCED (GAUZE/BANDAGES/DRESSINGS) ×2
DERMABOND ADVANCED .7 DNX12 (GAUZE/BANDAGES/DRESSINGS) ×1 IMPLANT
DRAPE INCISE IOBAN 85X60 (DRAPES) ×3 IMPLANT
DRAPE POUCH INSTRU U-SHP 10X18 (DRAPES) ×3 IMPLANT
DRAPE SPLIT 77X100IN (DRAPES) ×6 IMPLANT
DRAPE SURG 17X11 SM STRL (DRAPES) ×3 IMPLANT
DRAPE U-SHAPE 47X51 STRL (DRAPES) ×3 IMPLANT
DRSG AQUACEL AG ADV 3.5X10 (GAUZE/BANDAGES/DRESSINGS) ×3 IMPLANT
DRSG AQUACEL AG ADV 3.5X14 (GAUZE/BANDAGES/DRESSINGS) IMPLANT
DURAPREP 26ML APPLICATOR (WOUND CARE) ×3 IMPLANT
ELECT BLADE TIP CTD 4 INCH (ELECTRODE) ×3 IMPLANT
ELECT REM PT RETURN 9FT ADLT (ELECTROSURGICAL) ×3
ELECTRODE REM PT RTRN 9FT ADLT (ELECTROSURGICAL) ×1 IMPLANT
FACESHIELD WRAPAROUND (MASK) ×12 IMPLANT
GAUZE SPONGE 2X2 8PLY STRL LF (GAUZE/BANDAGES/DRESSINGS) IMPLANT
GLOVE BIOGEL PI IND STRL 6.5 (GLOVE) ×2 IMPLANT
GLOVE BIOGEL PI IND STRL 7.5 (GLOVE) ×1 IMPLANT
GLOVE BIOGEL PI IND STRL 8.5 (GLOVE) ×1 IMPLANT
GLOVE BIOGEL PI INDICATOR 6.5 (GLOVE) ×4
GLOVE BIOGEL PI INDICATOR 7.5 (GLOVE) ×2
GLOVE BIOGEL PI INDICATOR 8.5 (GLOVE) ×2
GLOVE ECLIPSE 8.0 STRL XLNG CF (GLOVE) ×3 IMPLANT
GLOVE SURG SS PI 6.5 STRL IVOR (GLOVE) ×6 IMPLANT
GOWN SPEC L3 XXLG W/TWL (GOWN DISPOSABLE) ×3 IMPLANT
GOWN STRL REUS W/TWL LRG LVL3 (GOWN DISPOSABLE) ×9 IMPLANT
HANDPIECE INTERPULSE COAX TIP (DISPOSABLE)
HEAD ARTICULEZE (Hips) ×1 IMPLANT
KIT BASIN OR (CUSTOM PROCEDURE TRAY) ×3 IMPLANT
MANIFOLD NEPTUNE II (INSTRUMENTS) ×3 IMPLANT
NS IRRIG 1000ML POUR BTL (IV SOLUTION) ×3 IMPLANT
PACK TOTAL JOINT (CUSTOM PROCEDURE TRAY) ×3 IMPLANT
PADDING CAST COTTON 6X4 STRL (CAST SUPPLIES) IMPLANT
POSITIONER SURGICAL ARM (MISCELLANEOUS) ×3 IMPLANT
PRESSURIZER FEMORAL UNIV (MISCELLANEOUS) IMPLANT
SCREW 6.5MMX25MM (Screw) ×3 IMPLANT
SET HNDPC FAN SPRY TIP SCT (DISPOSABLE) IMPLANT
SPONGE GAUZE 2X2 STER 10/PKG (GAUZE/BANDAGES/DRESSINGS)
SPONGE LAP 18X18 X RAY DECT (DISPOSABLE) IMPLANT
SPONGE LAP 4X18 X RAY DECT (DISPOSABLE) IMPLANT
STAPLER VISISTAT 35W (STAPLE) IMPLANT
SUCTION FRAZIER TIP 10 FR DISP (SUCTIONS) ×3 IMPLANT
SUT MNCRL AB 4-0 PS2 18 (SUTURE) ×3 IMPLANT
SUT VIC AB 1 CT1 36 (SUTURE) ×6 IMPLANT
SUT VIC AB 2-0 CT1 27 (SUTURE) ×4
SUT VIC AB 2-0 CT1 TAPERPNT 27 (SUTURE) ×2 IMPLANT
SUT VLOC 180 0 24IN GS25 (SUTURE) ×3 IMPLANT
TOWEL OR 17X26 10 PK STRL BLUE (TOWEL DISPOSABLE) ×6 IMPLANT
TOWER CARTRIDGE SMART MIX (DISPOSABLE) IMPLANT
TRAY FOLEY CATH 14FRSI W/METER (CATHETERS) ×3 IMPLANT
WATER STERILE IRR 1500ML POUR (IV SOLUTION) ×3 IMPLANT

## 2014-08-02 NOTE — Interval H&P Note (Signed)
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 After consideration of risks, benefits and other options for treatment, the patient has consented to  Procedure(s): RIGHT TOTAL HIP REVISION (Right) as a surgical intervention .  The patient's history has been reviewed, patient examined, no change in status, stable for surgery.  I have reviewed the patient's chart and labs.  Questions were answered to the patient's satisfaction.     Mauri Pole

## 2014-08-02 NOTE — Transfer of Care (Signed)
Immediate Anesthesia Transfer of Care Note  Patient: Carolyn Henderson  Procedure(s) Performed: Procedure(s) (LRB): RIGHT TOTAL HIP REVISION (Right)  Patient Location: PACU  Anesthesia Type: Spinal  Level of Consciousness: sedated, patient cooperative and responds to stimulation  Airway & Oxygen Therapy: Patient Spontanous Breathing and Patient connected to face mask oxgen  Post-op Assessment: Report given to PACU RN and Post -op Vital signs reviewed and stable  Post vital signs: Reviewed and stable  Complications: No apparent anesthesia complications

## 2014-08-02 NOTE — Anesthesia Preprocedure Evaluation (Signed)
Anesthesia Evaluation  Patient identified by MRN, date of birth, ID band Patient awake    Reviewed: Allergy & Precautions, H&P , NPO status , Patient's Chart, lab work & pertinent test results  History of Anesthesia Complications (+) PONV  Airway Mallampati: II TM Distance: >3 FB Neck ROM: full    Dental no notable dental hx. (+) Teeth Intact, Dental Advisory Given   Pulmonary neg pulmonary ROS, shortness of breath and with exertion, former smoker,  breath sounds clear to auscultation  Pulmonary exam normal       Cardiovascular hypertension, Pt. on medications Rhythm:regular Rate:Normal     Neuro/Psych Back surgery negative neurological ROS  negative psych ROS   GI/Hepatic negative GI ROS, Neg liver ROS,   Endo/Other  negative endocrine ROSHypothyroidism   Renal/GU negative Renal ROS  negative genitourinary   Musculoskeletal   Abdominal   Peds  Hematology negative hematology ROS (+)   Anesthesia Other Findings   Reproductive/Obstetrics negative OB ROS                           Anesthesia Physical Anesthesia Plan  ASA: II  Anesthesia Plan: Spinal   Post-op Pain Management:    Induction:   Airway Management Planned: Simple Face Mask  Additional Equipment:   Intra-op Plan:   Post-operative Plan:   Informed Consent: I have reviewed the patients History and Physical, chart, labs and discussed the procedure including the risks, benefits and alternatives for the proposed anesthesia with the patient or authorized representative who has indicated his/her understanding and acceptance.   Dental Advisory Given  Plan Discussed with: CRNA and Surgeon  Anesthesia Plan Comments:         Anesthesia Quick Evaluation

## 2014-08-02 NOTE — Progress Notes (Signed)
Patient states she finished taking Cipro for UTI

## 2014-08-02 NOTE — Anesthesia Procedure Notes (Signed)
Spinal  Patient location during procedure: OR Start time: 08/02/2014 5:00 PM End time: 08/02/2014 5:05 PM Staffing CRNA/Resident: Anne Fu Performed by: resident/CRNA  Preanesthetic Checklist Completed: patient identified, site marked, surgical consent, pre-op evaluation, timeout performed, IV checked, risks and benefits discussed and monitors and equipment checked Spinal Block Patient position: sitting Prep: Betadine Patient monitoring: heart rate, continuous pulse ox and blood pressure Approach: right paramedian Location: L2-3 Injection technique: single-shot Needle Needle type: Spinocan  Needle gauge: 22 G Needle length: 9 cm Assessment Sensory level: T6 Additional Notes Expiration date of kit checked and confirmed. Patient tolerated procedure well, without complications. X 1 attempt with noted clear CSF return.  Aspiration an administration of medication within difficulty. Noted loss of motor and sensory on exam.

## 2014-08-02 NOTE — Plan of Care (Signed)
Problem: Consults Goal: Total Joint Replacement Patient Education See Patient Education Module for education specifics. Outcome: Progressing Revision of previous Rt THA Goal: Diagnosis- Total Joint Replacement Outcome: Progressing Revision Total Hip  Problem: Phase I Progression Outcomes Goal: Pain controlled with appropriate interventions Outcome: Progressing Discussed scheduled and prn pain medications as well as nonpharmacological pain management techniques. Goal: Initial discharge plan identified Outcome: Progressing Preoperative plan for pt to be discharged to [facility] in [place] once medically ready.

## 2014-08-02 NOTE — Brief Op Note (Signed)
08/02/2014  6:23 PM  PATIENT:  Carolyn Henderson  80 y.o. 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  POST-OPERATIVE DIAGNOSIS:  FAILED RIGHT TOTAL HIP REPLACEMENT POLYEPHYLENE WEAR  PROCEDURE:  Procedure(s): RIGHT TOTAL HIP REVISION (Right)  SURGEON:  Surgeon(s) and Role:    * Mauri Pole, MD - Primary  PHYSICIAN ASSISTANT: Danae Orleans, PA-C  ANESTHESIA:   spinal  EBL:  Total I/O In: 1000 [I.V.:1000] Out: 150 [Blood:150]  BLOOD ADMINISTERED:none  DRAINS: none   LOCAL MEDICATIONS USED:  NONE  SPECIMEN:  No Specimen  DISPOSITION OF SPECIMEN:  N/A  COUNTS:  YES  TOURNIQUET:  * No tourniquets in log *  DICTATION: .Other Dictation: Dictation Number [id number]  PLAN OF CARE: Admit to inpatient   PATIENT DISPOSITION:  PACU - hemodynamically stable.   Delay start of Pharmacological VTE agent (>24hrs) due to surgical blood loss or risk of bleeding: no

## 2014-08-02 NOTE — Anesthesia Postprocedure Evaluation (Signed)
  Anesthesia Post-op Note  Patient: Carolyn Henderson  Procedure(s) Performed: Procedure(s) (LRB): RIGHT TOTAL HIP REVISION (Right)  Patient Location: PACU  Anesthesia Type: Spinal  Level of Consciousness: awake and alert   Airway and Oxygen Therapy: Patient Spontanous Breathing  Post-op Pain: mild  Post-op Assessment: Post-op Vital signs reviewed, Patient's Cardiovascular Status Stable, Respiratory Function Stable, Patent Airway and No signs of Nausea or vomiting  Last Vitals:  Filed Vitals:   08/02/14 1930  BP: 77/46  Pulse: 58  Temp: 36.4 C  Resp: 12    Post-op Vital Signs: stable   Complications: No apparent anesthesia complications

## 2014-08-03 ENCOUNTER — Encounter (HOSPITAL_COMMUNITY): Payer: Self-pay | Admitting: Orthopedic Surgery

## 2014-08-03 LAB — BASIC METABOLIC PANEL
Anion gap: 10 (ref 5–15)
BUN: 11 mg/dL (ref 6–23)
CALCIUM: 8.3 mg/dL — AB (ref 8.4–10.5)
CO2: 24 mEq/L (ref 19–32)
Chloride: 98 mEq/L (ref 96–112)
Creatinine, Ser: 0.51 mg/dL (ref 0.50–1.10)
GFR calc Af Amer: >90 mL/min (ref >90)
GFR, EST NON AFRICAN AMERICAN: 87 mL/min — AB (ref >90)
Glucose, Bld: 181 mg/dL — ABNORMAL HIGH (ref 70–99)
POTASSIUM: 4.3 meq/L (ref 3.7–5.3)
Sodium: 132 mEq/L — ABNORMAL LOW (ref 137–147)

## 2014-08-03 LAB — CBC
HCT: 33.4 % — ABNORMAL LOW (ref 36.0–46.0)
Hemoglobin: 11.3 g/dL — ABNORMAL LOW (ref 12.0–15.0)
MCH: 30.6 pg (ref 26.0–34.0)
MCHC: 33.8 g/dL (ref 30.0–36.0)
MCV: 90.5 fL (ref 78.0–100.0)
Platelets: 319 10*3/uL (ref 150–400)
RBC: 3.69 MIL/uL — ABNORMAL LOW (ref 3.87–5.11)
RDW: 13.7 % (ref 11.5–15.5)
WBC: 6.7 10*3/uL (ref 4.0–10.5)

## 2014-08-03 NOTE — Progress Notes (Signed)
Patient ID: Carolyn Henderson, female   DOB: May 24, 1930, 80 y.o.   MRN: [id number] Subjective: 1 Day Post-Op Procedure(s) (LRB): RIGHT TOTAL HIP REVISION (Right)    Patient reports pain as mild.  No events over night, ready for therapy. Eating breakfast this am  Objective:   VITALS:   Filed Vitals:   08/03/14 0453  BP: 103/53  Pulse: 67  Temp: 98.3 F (36.8 C)  Resp: 16    Neurovascular intact Incision: dressing C/D/I  LABS  Recent Labs  08/03/14 0518  HGB 11.3*  HCT 33.4*  WBC 6.7  PLT 319     Recent Labs  08/03/14 0518  NA 132*  K 4.3  BUN 11  CREATININE 0.51  GLUCOSE 181*    No results found for this basename: LABPT, INR,  in the last 72 hours   Assessment/Plan: 1 Day Post-Op Procedure(s) (LRB): RIGHT TOTAL HIP REVISION (Right)   Up with therapy - I want her to be PWB for 4-6 weeks to assure bony ingrowth of her new acetabulum Discharge to SNF when applicable per Medicare

## 2014-08-03 NOTE — Progress Notes (Signed)
OT Cancellation Note  Patient Details Name: Carolyn Henderson MRN: [id number] DOB: January 11, 1930   Cancelled Treatment:    Reason Eval/Treat Not Completed: Other (comment) Note plan for SNF. Will defer OT eval to SNF.   Jules Schick [phone] 08/03/2014, 12:01 PM

## 2014-08-03 NOTE — Progress Notes (Signed)
Clinical Social Work Department CLINICAL SOCIAL WORK PLACEMENT NOTE 08/03/2014  Patient:  Carolyn Henderson, Carolyn Henderson  Account Number:  [id number] Admit date:  08/02/2014  Clinical Social Worker:  Glorious Peach, CLINICAL SOCIAL WORKER  Date/time:  08/03/2014 01:59 PM  Clinical Social Work is seeking post-discharge placement for this patient at the following level of care:   SKILLED NURSING   (*CSW will update this form in Epic as items are completed)   08/03/2014  Patient/family provided with [place] Department of Clinical Social Work's list of facilities offering this level of care within the geographic area requested by the patient (or if unable, by the patient's family).  08/03/2014  Patient/family informed of their freedom to choose among providers that offer the needed level of care, that participate in Medicare, Medicaid or managed care program needed by the patient, have an available bed and are willing to accept the patient.  08/03/2014  Patient/family informed of [facility]' ownership interest in [facility], as well as of the fact that they are under no obligation to receive care at this facility.  PASARR submitted to EDS on 08/03/2014 PASARR number received on 08/03/2014  FL2 transmitted to all facilities in geographic area requested by pt/family on  08/03/2014 FL2 transmitted to all facilities within larger geographic area on   Patient informed that his/her managed care company has contracts with or will negotiate with  certain facilities, including the following:     Patient/family informed of bed offers received:  08/03/2014 Patient chooses bed at [place], [facility] Physician recommends and patient chooses bed at    Patient to be transferred to Janyce Llanos, [facility] on   Patient to be transferred to facility by  Patient and family notified of transfer on  Name of family member notified:    The following physician request were entered in  Epic:   Additional Comments:    [place] Intern   Reviewed by Sindy Messing, LCSW [phone]

## 2014-08-03 NOTE — Care Management Note (Signed)
    Page 1 of 1   08/03/2014     12:45:18 PM CARE MANAGEMENT NOTE 08/03/2014  Patient:  Carolyn Henderson, Carolyn Henderson   Account Number:  [id number]  Date Initiated:  08/03/2014  Documentation initiated by:  [facility]  Subjective/Objective Assessment:   UXN:ATFTD TOTAL HIP REVISION (Right)     Action/Plan:   discharge plan is to SNF   Anticipated DC Date:  08/04/2014   Anticipated DC Plan:  SKILLED NURSING FACILITY         Choice offered to / List presented to:             Status of service:  Completed, signed off Medicare Important Message given?   (If response is "NO", the following Medicare IM given date fields will be blank) Date Medicare IM given:   Medicare IM given by:   Date Additional Medicare IM given:   Additional Medicare IM given by:    Discharge Disposition:  [place]  Per UR Regulation:    If discussed at Long Length of Stay Meetings, dates discussed:    Comments:  08/03/14 12:40 CM notes plan for pt is to go to SNF post hospitalization; CSW aware and arranging.  No other CM needs were communicated.  Mariane Masters, BSN, CM [phone].

## 2014-08-03 NOTE — Progress Notes (Signed)
Utilization review completed.  

## 2014-08-03 NOTE — Progress Notes (Signed)
Clinical Social Work Department BRIEF PSYCHOSOCIAL ASSESSMENT 08/03/2014  Patient:  Carolyn Henderson, Carolyn Henderson     Account Number:  [id number]     Admit date:  08/02/2014  Clinical Social Worker:  Glorious Peach, CLINICAL SOCIAL WORKER  Date/Time:  08/03/2014 01:46 PM  Referred by:  Physician  Date Referred:  08/03/2014 Referred for  SNF Placement   Other Referral:   Interview type:  Patient Other interview type:   Bedside    PSYCHOSOCIAL DATA Living Status:  HUSBAND Admitted from facility:   Level of care:   Primary support name:  Lewis L. Encarnacion Primary support relationship to patient:  SPOUSE Degree of support available:   Adequate    CURRENT CONCERNS Current Concerns  Post-Acute Placement   Other Concerns:   SNF placement.    SOCIAL WORK ASSESSMENT / PLAN CSW spoke with patient at bedside with plans after DC. CSW reviewed charts, and seen that patient was hoping to be placed at [facility] when medically stable and DC. CSW confirmed placement with patient, who is agreeable to go to [facility] when DC and will notify her husband with plans. CSW asked patient if there were any more questions regarding plans, patient assured CSW that everything was clear. CSW will continue to follow.   Assessment/plan status:  Information/Referral to [employer] Other assessment/ plan:   Information/referral to community resources:    PATIENT'S/FAMILY'S RESPONSE TO PLAN OF CARE: Patient aware and agreeable to plans. Patient will keep family updated and is happy that she has all plans ready for [facility] [place].       Glorious Peach BSW Intern    Reviewed by Sindy Messing, LCSW

## 2014-08-03 NOTE — Op Note (Signed)
Carolyn, Henderson NO.:  [id number]  MEDICAL RECORD NO.:  [id number]  LOCATION:  5852                         FACILITY:  [facility]  PHYSICIAN:  Pietro Cassis. Alvan Dame, M.D.  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  PROCEDURE:  Revision of right total hip arthroplasty with acetabular revision using a size 52 Pinnacle shell, size 36+ 4 neutral AltrX liner, two cancellous screws into the ilium and a 36+ 5 ARTICUL/EZE metal ball.  SURGEON:  Pietro Cassis. Alvan Dame, M.D.  ASSISTANT:  Danae Orleans, PA-C.  Note that Mr. Guinevere Scarlet was present for the entirety of the case from preoperative position, perioperative management of the operative extremity, general facilitation of the case and primary wound closure.  ANESTHESIA:  Spinal.  SPECIMENS:  None.  COMPLICATION:  None.  BLOOD LOSS:  About 150 mL.  DRAINS:  None.  INDICATIONS FOR PROCEDURE:  Carolyn Henderson is a pleasant 80 year old female who had a history of right total hip arthroplasty performed 17 years ago.  She has moved to this area and has been assumed under our care. We have been following her right hip over the years.  She subsequently has had a left total hip arthroplasty.  She was noted to have asymmetric poly wear on the right hip.  After multiple discussions, we have decided at this point in time to go ahead and revise the hip to prevent complications for further polyethylene wear.  Risks of dislocation and isolated acetabular revision, risk of infection, DVT, and risk for future surgeries were all discussed in hopes for improved longevity for the rest of her life for this right hip.  Consent was obtained for above.  PROCEDURE IN DETAIL:  The  patient was brought to the operative theater. Once adequate anesthesia, preoperative antibiotics, Ancef administered, she was positioned into the left lateral decubitus position with the right side up.  The right lower extremity was then prepped and draped in a sterile fashion.  Time-out was performed to identifying the patient, planned procedure in this right lower extremity.  She had had a previous old Southern-type incision.  I demarcated this out and elected to use a portion of the incision.  Soft tissue planes were created over the greater trochanter.  The iliotibial band and gluteal fascia were then split for posterior approach to the hip.  The patient's pseudocapsule was taken down identifying significant intra-articular synovitic response.  At this point, I took time to debride this synovitic response throughout the posterior two-thirds of the hip.  Once this was carried out, the hip was dislocated.  The femoral head was removed.  Femoral component was noted to be stable.  I had elevated the soft tissues off the superior ilium and then placed the trunnion of the femoral component onto the ilium to allow for acetabular exposure.  Further exposure of the anterior aspect of the hip was carried out including using curette to demarcate the metal bone junction.  Then using the Innomed Explant system for 48 cup, the 48 starter blade was inserted and I was able to get the circumferentially around the acetabular shell and removed the acetabulum with minimal bone loss.  This was a 48 shell.  I began reaming with 48 reamer and then reamed to 51 reamer very gently as her bone was very osteopenic versus osteolytic.  I then elected to use a 52 cup that was gently impacted at about 35-40 degrees of abduction and 20 degrees of forward flexion as it was beneath the remaining anterior rim of the acetabulum.  I then placed two cancellous screws into the ilium one posterior and one superior  with good fixation.  I then placed a trial liner.  I did a trial liner with a neutral liner and found despite the femoral component being pretty neutral with now about 10 degrees of anteversion.  The trial reduction with the first at 3615 and then a 365 ball was placed not for stability standpoint as she very stably even with a 1.5, but mainly for leg length purposes as she was shorter on this right side.  Following this trial reduction and my satisfaction with the stability of her hip, I dislocated the hip and removed the trial components, placed a hole eliminator in the the acetabulum, irrigated the acetabulum and then placed the final 36+ 4 neutral AltrX liner.  The final 36+ 5 metal ball was then impacted onto a clean and dry trunnion, and the hip was reduced.  We irrigated the hip throughout the case, and again at this point, I then reapproximated the posterior pseudocapsule to itself using #1 Vicryl.  The iliotibial band and gluteal fascia were reapproximated using #1 Vicryl in a 0 V-Loc suture.  The remainder of the wound was closed with 2-0 Vicryl and running 4-0 Monocryl.  The hip was then cleaned, dried and dressed sterilely using Dermabond and Aquacel dressing.  She was then brought to the recovery room in stable condition tolerating the procedure well.     Pietro Cassis Alvan Dame, M.D.     MDO/MEDQ  D:  08/02/2014  Henderson:  08/03/2014  Job:  [id number]

## 2014-08-03 NOTE — Evaluation (Signed)
Physical Therapy Evaluation Patient Details Name: Carolyn Henderson MRN: [id number] DOB: May 08, 1930 Today's Date: 08/03/2014   History of Present Illness  Acetabular revision of THA on R  Clinical Impression  Patient tolerated ambulation very well, maintains 50%, R leg tends to invert, appears  Mostly  The foot that is inverted. Patient will benefit from PT to address problems listed in chart below.    Follow Up Recommendations SNF;Supervision/Assistance - 24 hour    Equipment Recommendations  None recommended by PT    Recommendations for Other Services       Precautions / Restrictions Precautions Precautions: Posterior Hip Restrictions RLE Weight Bearing: Partial weight bearing RLE Partial Weight Bearing Percentage or Pounds: 50%      Mobility  Bed Mobility Overal bed mobility: Needs Assistance Bed Mobility: Supine to Sit     Supine to sit: Min assist     General bed mobility comments: cues for  Poaterior hip precautions,  Transfers Overall transfer level: Needs assistance Equipment used: Rolling walker (2 wheeled) Transfers: Sit to/from Stand Sit to Stand: Min assist         General transfer comment: cues for Hand and  R leg position, posterior precautions.  Ambulation/Gait Ambulation/Gait assistance: Min assist Ambulation Distance (Feet): 80 Feet Assistive device: Rolling walker (2 wheeled) Gait Pattern/deviations: Step-to pattern;Antalgic     General Gait Details: R  foot and leg  tend to internally rotate, frequent cues to  place R leg nearer to neutral during swing.  Stairs            Wheelchair Mobility    Modified Rankin (Stroke Patients Only)       Balance                                             Pertinent Vitals/Pain Pain Assessment: 0-10 Pain Score: 3  Pain Location: R hip Pain Descriptors / Indicators: Discomfort Pain Intervention(s): Monitored during session;Premedicated before session    Home Living  Family/patient expects to be discharged to:: Skilled nursing facility Living Arrangements: Spouse/significant other                    Prior Function Level of Independence: Independent               Hand Dominance        Extremity/Trunk Assessment   Upper Extremity Assessment: Overall WFL for tasks assessed           Lower Extremity Assessment: RLE deficits/detail RLE Deficits / Details: able to advance Leg       Communication   Communication: No difficulties  Cognition Arousal/Alertness: Awake/alert Behavior During Therapy: WFL for tasks assessed/performed Overall Cognitive Status: Within Functional Limits for tasks assessed                      General Comments      Exercises        Assessment/Plan    PT Assessment Patient needs continued PT services  PT Diagnosis Abnormality of gait   PT Problem List Decreased strength;Decreased activity tolerance;Decreased range of motion;Decreased mobility;Decreased knowledge of precautions;Decreased safety awareness;Decreased knowledge of use of DME;Pain  PT Treatment Interventions DME instruction;Gait training;Functional mobility training;Therapeutic activities;Therapeutic exercise;Patient/family education   PT Goals (Current goals can be found in the Care Plan section) Acute Rehab PT Goals Patient Stated Goal: to walk  without pain PT Goal Formulation: With patient Time For Goal Achievement: 08/10/14 Potential to Achieve Goals: Good    Frequency 7X/week   Barriers to discharge        Co-evaluation               End of Session   Activity Tolerance: Patient tolerated treatment well Patient left: in chair;with call bell/phone within reach;with family/visitor present Nurse Communication: Mobility status         Time: 2542-7062 PT Time Calculation (min): 26 min   Charges:   PT Evaluation $Initial PT Evaluation Tier I: 1 Procedure PT Treatments $Gait Training: 8-22 mins $Self  Care/Home Management: 8-22   PT G Codes:          Claretha Cooper 08/03/2014, 1:30 PM Tresa Endo PT [phone]

## 2014-08-04 LAB — BASIC METABOLIC PANEL
ANION GAP: 11 (ref 5–15)
BUN: 13 mg/dL (ref 6–23)
CALCIUM: 8.2 mg/dL — AB (ref 8.4–10.5)
CO2: 23 meq/L (ref 19–32)
Chloride: 96 mEq/L (ref 96–112)
Creatinine, Ser: 0.5 mg/dL (ref 0.50–1.10)
GFR calc non Af Amer: 88 mL/min — ABNORMAL LOW (ref >90)
Glucose, Bld: 132 mg/dL — ABNORMAL HIGH (ref 70–99)
Potassium: 4.5 mEq/L (ref 3.7–5.3)
SODIUM: 130 meq/L — AB (ref 137–147)

## 2014-08-04 LAB — CBC
HCT: 30.7 % — ABNORMAL LOW (ref 36.0–46.0)
Hemoglobin: 10.4 g/dL — ABNORMAL LOW (ref 12.0–15.0)
MCH: 30.7 pg (ref 26.0–34.0)
MCHC: 33.9 g/dL (ref 30.0–36.0)
MCV: 90.6 fL (ref 78.0–100.0)
PLATELETS: 317 10*3/uL (ref 150–400)
RBC: 3.39 MIL/uL — AB (ref 3.87–5.11)
RDW: 13.9 % (ref 11.5–15.5)
WBC: 8.1 10*3/uL (ref 4.0–10.5)

## 2014-08-04 NOTE — Progress Notes (Signed)
Physical Therapy Treatment Patient Details Name: Carolyn Henderson MRN: [id number] DOB: May 17, 1930 Today's Date: 08/04/2014    History of Present Illness Acetabular revision of THA on R    PT Comments    POD # 2 am session.  Assisted pt out of recliner to amb in hallway.  Assisted to BR then to recliner to perform TE's followed by ICE.  Follow Up Recommendations  [facility]     Equipment Recommendations  None recommended by PT    Recommendations for Other Services       Precautions / Restrictions Precautions Precautions: Posterior Hip Precaution Comments: educated pt on THP as pt attempted to bend over to "fix sock" when I tried to trick her and asked her to fix her sock.  Educated on all 3 THP. Restrictions Weight Bearing Restrictions: Yes RLE Weight Bearing: Partial weight bearing RLE Partial Weight Bearing Percentage or Pounds: 50%    Mobility  Bed Mobility               General bed mobility comments: Pt OOB in recliner  Transfers Overall transfer level: Needs assistance Equipment used: Rolling walker (2 wheeled) Transfers: Sit to/from Stand Sit to Stand: Min assist         General transfer comment: cues for Hand and  R leg position, posterior precautions.  Ambulation/Gait Ambulation/Gait assistance: Min guard Ambulation Distance (Feet): 75 Feet Assistive device: Rolling walker (2 wheeled) Gait Pattern/deviations: Step-to pattern;Decreased stance time - right Gait velocity: decreased   General Gait Details: pt doing well to adhere PWB.  <25% VC's on safety with turns and increased time.   Stairs            Wheelchair Mobility    Modified Rankin (Stroke Patients Only)       Balance                                    Cognition                            Exercises   Total Hip Replacement TE's 10 reps ankle pumps 10 reps knee presses 10 reps heel slides 10 reps SAQ's 10 reps ABD Followed by  ICE     General Comments        Pertinent Vitals/Pain Pain Assessment: 0-10 Pain Score: 1  Pain Location: R hip Pain Descriptors / Indicators: Discomfort Pain Intervention(s): Monitored during session;Repositioned;Ice applied    Home Living                      Prior Function            PT Goals (current goals can now be found in the care plan section) Progress towards PT goals: Progressing toward goals    Frequency  7X/week    PT Plan      Co-evaluation             End of Session Equipment Utilized During Treatment: Gait belt Activity Tolerance: Patient tolerated treatment well Patient left: in chair;with call bell/phone within reach;with family/visitor present     Time: 8938-1017 PT Time Calculation (min): 26 min  Charges:  $Gait Training: 8-22 mins $Therapeutic Exercise: 8-22 mins                    G Codes:  Rica Koyanagi  PTA [facility]  Acute  Rehab Pager      [phone]

## 2014-08-04 NOTE — Progress Notes (Signed)
     Subjective: 2 Days Post-Op Procedure(s) (LRB): RIGHT TOTAL HIP REVISION (Right)   Seen by Dr. Alvan Dame. Patient reports pain as mild, pain controlled. No events throughout the night.   Objective:   VITALS:   Filed Vitals:   08/04/14 0553  BP: 121/67  Pulse: 66  Temp: 98.4 F (36.9 C)  Resp: 16    Dorsiflexion/Plantar flexion intact Incision: dressing C/D/I No cellulitis present Compartment soft  LABS  Recent Labs  08/03/14 0518 08/04/14 0435  HGB 11.3* 10.4*  HCT 33.4* 30.7*  WBC 6.7 8.1  PLT 319 317     Recent Labs  08/03/14 0518 08/04/14 0435  NA 132* 130*  K 4.3 4.5  BUN 11 13  CREATININE 0.51 0.50  GLUCOSE 181* 132*     Assessment/Plan: 2 Days Post-Op Procedure(s) (LRB): RIGHT TOTAL HIP REVISION (Right) Up with therapy Discharge to SNF, when ready, possibly tomorrow.   Carolyn Henderson   PAC  08/04/2014, 9:02 AM

## 2014-08-04 NOTE — Progress Notes (Signed)
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  Assisted pt out of bathroom to amb in hallway a second time.  Assisted to recliner and positioned to comfort followed by ICE.    Follow Up Recommendations  SNF ([facility])     Equipment Recommendations  None recommended by PT    Recommendations for Other Services       Precautions / Restrictions Precautions Precautions: Posterior Hip Precaution Comments: educated pt on THP as pt attempted to bend over to "fix sock" when I tried to trick her and asked her to fix her sock.  Educated on all 3 THP. Restrictions Weight Bearing Restrictions: Yes RLE Weight Bearing: Partial weight bearing RLE Partial Weight Bearing Percentage or Pounds: 50%    Mobility  Bed Mobility               General bed mobility comments: Pt OOB in recliner  Transfers Overall transfer level: Needs assistance Equipment used: Rolling walker (2 wheeled) Transfers: Sit to/from Stand Sit to Stand: Min assist         General transfer comment: cues for Hand and  R leg position, posterior precautions.  Ambulation/Gait Ambulation/Gait assistance: Min guard Ambulation Distance (Feet): 75 Feet Assistive device: Rolling walker (2 wheeled) Gait Pattern/deviations: Step-to pattern;Decreased stance time - right Gait velocity: decreased   General Gait Details: pt doing well to adhere PWB.  <25% VC's on safety with turns and increased time.   Stairs            Wheelchair Mobility    Modified Rankin (Stroke Patients Only)       Balance                                    Cognition                            Exercises      General Comments        Pertinent Vitals/Pain Pain Assessment: 0-10 Pain Score: 1  Pain Location: R  hip Pain Descriptors / Indicators: Discomfort Pain Intervention(s): Monitored during session;Repositioned;Ice applied    Home Living                      Prior Function            PT Goals (current goals can now be found in the care plan section) Progress towards PT goals: Progressing toward goals    Frequency  7X/week    PT Plan      Co-evaluation             End of Session Equipment Utilized During Treatment: Gait belt Activity Tolerance: Patient tolerated treatment well Patient left: in chair;with call bell/phone within reach;with family/visitor present     Time: 0998-3382 PT Time Calculation (min): 27 min  Charges:  $Gait Training: 8-22 mins $Therapeutic Activity: 8-22 mins                    G Codes:      Carolyn Henderson  PTA [facility]  Acute  Rehab Pager      [phone]

## 2014-08-04 NOTE — Progress Notes (Signed)
Clinical Social Work  Per Therapist, sports, patient is not ready to DC today. CSW updated [facility] who is agreeable to accept patient whenever medically stable. CSW will continue to follow.  Sindy Messing, LCSW (Coverage for [employer])

## 2014-08-05 DIAGNOSIS — M859 Disorder of bone density and structure, unspecified: Secondary | ICD-10-CM | POA: Diagnosis not present

## 2014-08-05 DIAGNOSIS — E785 Hyperlipidemia, unspecified: Secondary | ICD-10-CM | POA: Diagnosis not present

## 2014-08-05 DIAGNOSIS — Z9181 History of falling: Secondary | ICD-10-CM | POA: Diagnosis not present

## 2014-08-05 DIAGNOSIS — I87309 Chronic venous hypertension (idiopathic) without complications of unspecified lower extremity: Secondary | ICD-10-CM | POA: Diagnosis not present

## 2014-08-05 DIAGNOSIS — T84060S Wear of articular bearing surface of internal prosthetic right hip joint, sequela: Secondary | ICD-10-CM | POA: Diagnosis not present

## 2014-08-05 DIAGNOSIS — M2610 Unspecified anomaly of jaw-cranial base relationship: Secondary | ICD-10-CM | POA: Diagnosis not present

## 2014-08-05 DIAGNOSIS — I10 Essential (primary) hypertension: Secondary | ICD-10-CM | POA: Diagnosis not present

## 2014-08-05 DIAGNOSIS — Z4789 Encounter for other orthopedic aftercare: Secondary | ICD-10-CM | POA: Diagnosis not present

## 2014-08-05 DIAGNOSIS — D649 Anemia, unspecified: Secondary | ICD-10-CM | POA: Diagnosis not present

## 2014-08-05 DIAGNOSIS — E878 Other disorders of electrolyte and fluid balance, not elsewhere classified: Secondary | ICD-10-CM | POA: Diagnosis not present

## 2014-08-05 DIAGNOSIS — Z96641 Presence of right artificial hip joint: Secondary | ICD-10-CM | POA: Diagnosis not present

## 2014-08-05 DIAGNOSIS — K5790 Diverticulosis of intestine, part unspecified, without perforation or abscess without bleeding: Secondary | ICD-10-CM | POA: Diagnosis not present

## 2014-08-05 DIAGNOSIS — K219 Gastro-esophageal reflux disease without esophagitis: Secondary | ICD-10-CM | POA: Diagnosis not present

## 2014-08-05 DIAGNOSIS — E039 Hypothyroidism, unspecified: Secondary | ICD-10-CM | POA: Diagnosis not present

## 2014-08-05 DIAGNOSIS — E079 Disorder of thyroid, unspecified: Secondary | ICD-10-CM | POA: Diagnosis not present

## 2014-08-05 MED ORDER — ASPIRIN 325 MG PO TBEC: 325.0000 mg | DELAYED_RELEASE_TABLET | Freq: Two times a day (BID) | ORAL | Status: DC

## 2014-08-05 MED ORDER — TRAMADOL HCL 50 MG PO TABS: 50.0000 mg | ORAL_TABLET | Freq: Four times a day (QID) | ORAL | Status: DC | PRN

## 2014-08-05 MED ORDER — POLYETHYLENE GLYCOL 3350 17 G PO PACK: 17.0000 g | PACK | Freq: Two times a day (BID) | ORAL | Status: DC

## 2014-08-05 MED ORDER — TIZANIDINE HCL 4 MG PO TABS: 4.0000 mg | ORAL_TABLET | Freq: Four times a day (QID) | ORAL | Status: DC | PRN

## 2014-08-05 MED ORDER — FERROUS SULFATE 325 (65 FE) MG PO TABS: 325.0000 mg | ORAL_TABLET | Freq: Three times a day (TID) | ORAL | Status: DC

## 2014-08-05 MED ORDER — ACETAMINOPHEN 325 MG PO TABS: 650.0000 mg | ORAL_TABLET | Freq: Four times a day (QID) | ORAL | Status: DC | PRN

## 2014-08-05 MED ORDER — DSS 100 MG PO CAPS: 100.0000 mg | ORAL_CAPSULE | Freq: Two times a day (BID) | ORAL | Status: DC

## 2014-08-05 NOTE — Discharge Summary (Signed)
Nursing facility contacted to receive report. Line busy, will attempt within 2 hrs.

## 2014-08-05 NOTE — Discharge Summary (Signed)
Physician Discharge Summary  Patient ID: YASMIN BRONAUGH MRN: [id number] DOB/AGE: January 10, 1930 80 y.o.  Admit date: 08/02/2014 Discharge date:  08/05/2014  Procedures:  Procedure(s) (LRB): RIGHT TOTAL HIP REVISION (Right)  Attending Physician:  Dr. Paralee Cancel   Admission Diagnoses:   Failed right hip arthroplasty  Discharge Diagnoses:  Active Problems:   S/P revision of total hip  Past Medical History  Diagnosis Date  . Hypertension   . Hyperlipidemia   . Thyroid disease   . Arthritis   . Complication of anesthesia   . PONV (postoperative nausea and vomiting)   . Shortness of breath     WITH EXERTION  . Osteopenia   . Precancerous skin lesion   . Diverticulosis   . GERD (gastroesophageal reflux disease)     ONLY OCCASIONAL  . Difficulty sleeping     TAKES TYLENOL PM NIGHTLY  . History of shingles     HPI: Carolyn Henderson, 80 y.o. female, has a history of pain and functional disability in the right hip due to failed right THA with polyethylene wear and patient has failed non-surgical conservative treatments for greater than 12 weeks to include NSAID's and/or analgesics, use of assistive devices and activity modification. The indications for the revision total hip arthroplasty are bearing surface wear leading to symptomatic synovitis and implant or hip misalignment. Onset of symptoms was gradual starting a couple of months ago with rapidlly worsening course since that time. Prior procedures on the right hip include arthroplasty. Patient currently rates pain in the right hip at 8 out of 10 with activity. There is night pain, worsening of pain with activity and weight bearing, trendelenberg gait, pain that interfers with activities of daily living and pain with passive range of motion. Patient has evidence of polyethylene wear by imaging studies. This condition presents safety issues increasing the risk of falls. There is no current active infection. Risks, benefits and  expectations were discussed with the patient. Risks including but not limited to the risk of anesthesia, blood clots, nerve damage, blood vessel damage, failure of the prosthesis, infection and up to and including death. Patient understand the risks, benefits and expectations and wishes to proceed with surgery.  PCP: Eulas Post, MD   Discharged Condition: good  Hospital Course:  Patient underwent the above stated procedure on 08/02/2014. Patient tolerated the procedure well and brought to the recovery room in good condition and subsequently to the floor.  POD #1 BP: 103/53 ; Pulse: 67 ; Temp: 98.3 F (36.8 C) ; Resp: 16  Patient reports pain as mild. No events over night, ready for therapy. Eating breakfast this am. Dorsiflexion/plantar flexion intact, incision: dressing C/D/I, no cellulitis present and compartment soft.   LABS  Basename    HGB  11.3  HCT  33.4   POD #2  BP: 121/67 ; Pulse: 66 ; Temp: 98.4 F (36.9 C) ; Resp: 16 Patient reports pain as mild, pain controlled. No events throughout the night.  Dorsiflexion/plantar flexion intact, incision: dressing C/D/I, no cellulitis present and compartment soft.   LABS  Basename    HGB  10.4  HCT  30.7   POD #3  BP: 122/57 ; Pulse: 66 ; Temp: 98.5 F (36.9 C) ; Resp: 17  Patient reports pain as mild, pain controlled. No events throughout the night. Ready to be discharged to skilled nursing facility. Dorsiflexion/plantar flexion intact, incision: dressing C/D/I, no cellulitis present and compartment soft.   LABS   No new labs  Discharge Exam: General appearance: alert, cooperative and no distress Extremities: Homans sign is negative, no sign of DVT, no edema, redness or tenderness in the calves or thighs and no ulcers, gangrene or trophic changes  Disposition:     Skilled nursing facility with follow up in 2 weeks   Follow-up Information   Follow up with Mauri Pole, MD. Schedule an appointment as soon as  possible for a visit in 2 weeks.   Specialty:  Orthopedic Surgery   Contact information:   [address] [place] [address] [phone]       Discharge Instructions   Call MD / Call 911    Complete by:  As directed   If you experience chest pain or shortness of breath, CALL 911 and be transported to the hospital emergency room.  If you develope a fever above 101 F, pus (white drainage) or increased drainage or redness at the wound, or calf pain, call your surgeon's office.     Change dressing    Complete by:  As directed   Maintain surgical dressing for 10-14 days, or until follow up in the clinic.     Constipation Prevention    Complete by:  As directed   Drink plenty of fluids.  Prune juice may be helpful.  You may use a stool softener, such as Colace (over the counter) 100 mg twice a day.  Use MiraLax (over the counter) for constipation as needed.     Diet - low sodium heart healthy    Complete by:  As directed      Discharge instructions    Complete by:  As directed   Maintain surgical dressing for 10-14 days, or until follow up in the clinic. Follow up in 2 weeks at [facility]. Call with any questions or concerns.     Driving restrictions    Complete by:  As directed   No driving for 4 weeks     Partial weight bearing    Complete by:  As directed   % Body Weight:  50  Laterality:  right  Extremity:  Lower     TED hose    Complete by:  As directed   Use stockings (TED hose) for 2 weeks on both leg(s).  You may remove them at night for sleeping.             Medication List         acetaminophen 325 MG tablet  Commonly known as:  TYLENOL  Take 2 tablets (650 mg total) by mouth every 6 (six) hours as needed for mild pain (or Fever >/= 101).     amLODipine 2.5 MG tablet  Commonly known as:  NORVASC  Take 2.5 mg by mouth every evening.     aspirin 325 MG EC tablet  Take 1 tablet (325 mg total) by mouth 2 (two) times daily.      Calcium Carbonate-Vitamin D 600-400 MG-UNIT per tablet  Take 1 tablet by mouth 2 (two) times daily.     cetirizine 10 MG tablet  Commonly known as:  ZYRTEC  Take 10 mg by mouth daily.     cholecalciferol 1000 UNITS tablet  Commonly known as:  VITAMIN D  Take 1,000 Units by mouth daily.     clobetasol ointment 0.05 %  Commonly known as:  TEMOVATE  Apply 1 application topically 2 (two) times daily as needed (for eczema).     DSS 100 MG Caps  Take 100  mg by mouth 2 (two) times daily.     ferrous sulfate 325 (65 FE) MG tablet  Take 1 tablet (325 mg total) by mouth 3 (three) times daily after meals.     gabapentin 300 MG capsule  Commonly known as:  NEURONTIN  Take 300 mg by mouth 2 (two) times daily.     GENTEAL OP  Apply 1 drop to eye at bedtime.     hydrochlorothiazide 25 MG tablet  Commonly known as:  HYDRODIURIL  Take 25 mg by mouth every morning.     levothyroxine 112 MCG tablet  Commonly known as:  SYNTHROID, LEVOTHROID  Take 112 mcg by mouth daily before breakfast.     loratadine 10 MG tablet  Commonly known as:  CLARITIN  Take 10 mg by mouth daily.     polyethylene glycol packet  Commonly known as:  MIRALAX / GLYCOLAX  Take 17 g by mouth 2 (two) times daily.     PRESERVISION AREDS 2 Caps  Take 2 capsules by mouth 2 (two) times daily.     SYSTANE OP  Apply 1 drop to eye daily.     tiZANidine 4 MG tablet  Commonly known as:  ZANAFLEX  Take 1 tablet (4 mg total) by mouth every 6 (six) hours as needed.     traMADol 50 MG tablet  Commonly known as:  ULTRAM  Take 1-2 tablets (50-100 mg total) by mouth every 6 (six) hours as needed.     vitamin B-12 1000 MCG tablet  Commonly known as:  CYANOCOBALAMIN  Take 1,000 mcg by mouth daily.         Signed: [place] Pugh. Gibril Mastro   PA-C  08/05/2014, 7:51 AM

## 2014-08-05 NOTE — Progress Notes (Signed)
Clinical Social Work Department CLINICAL SOCIAL WORK PLACEMENT NOTE 08/05/2014  Patient:  Carolyn Henderson, Carolyn Henderson  Account Number:  [id number] Admit date:  08/02/2014  Clinical Social Worker:  Glorious Peach, CLINICAL SOCIAL WORKER  Date/time:  08/03/2014 01:59 PM  Clinical Social Work is seeking post-discharge placement for this patient at the following level of care:   SKILLED NURSING   (*CSW will update this form in Epic as items are completed)   08/03/2014  Patient/family provided with [facility] Department of Clinical Social Work's list of facilities offering this level of care within the geographic area requested by the patient (or if unable, by the patient's family).  08/03/2014  Patient/family informed of their freedom to choose among providers that offer the needed level of care, that participate in Medicare, Medicaid or managed care program needed by the patient, have an available bed and are willing to accept the patient.  08/03/2014  Patient/family informed of [facility]' ownership interest in [facility], as well as of the fact that they are under no obligation to receive care at this facility.  PASARR submitted to EDS on 08/03/2014 PASARR number received on 08/03/2014  FL2 transmitted to all facilities in geographic area requested by pt/family on  08/03/2014 FL2 transmitted to all facilities within larger geographic area on   Patient informed that his/her managed care company has contracts with or will negotiate with  certain facilities, including the following:     Patient/family informed of bed offers received:  08/03/2014 Patient chooses bed at [facility], [facility] Physician recommends and patient chooses bed at    Patient to be transferred to [place], [facility] on  08/05/2014 Patient to be transferred to facility by FAMILY Patient and family notified of transfer on 08/05/2014 Name of family member notified:  SPOUSE  The following  physician request were entered in Epic:   Additional Comments: Pt and spouse are in agreement with d/c to SNF today. Pt able to transport by car. NSG reviewed d/c summary, scripts, avs. Scripts included in d/c packet.  Werner Lean LCSW [phone]

## 2014-08-05 NOTE — Progress Notes (Signed)
     Subjective: 3 Days Post-Op Procedure(s) (LRB): RIGHT TOTAL HIP REVISION (Right)   Patient reports pain as mild, pain controlled. No events throughout the night. Ready to be discharged to skilled nursing facility.  Objective:   VITALS:   Filed Vitals:   08/05/14 0640  BP: 122/57  Pulse: 66  Temp: 98.5 F (36.9 C)  Resp: 17    Dorsiflexion/Plantar flexion intact Incision: dressing C/D/I No cellulitis present Compartment soft  LABS  Recent Labs  08/03/14 0518 08/04/14 0435  HGB 11.3* 10.4*  HCT 33.4* 30.7*  WBC 6.7 8.1  PLT 319 317     Recent Labs  08/03/14 0518 08/04/14 0435  NA 132* 130*  K 4.3 4.5  BUN 11 13  CREATININE 0.51 0.50  GLUCOSE 181* 132*     Assessment/Plan: 3 Days Post-Op Procedure(s) (LRB): RIGHT TOTAL HIP REVISION (Right) Up with therapy Discharge to SNF Follow up in 2 weeks at [facility]. Follow up with OLIN,Berniece Abid D in 2 weeks.  Contact information:  [facility] [address] [place] T[place] Scarfo   PAC  08/05/2014, 7:42 AM

## 2014-08-05 NOTE — Discharge Summary (Signed)
Pt discharged to [facility]. VSS. Pt questions were encouraged for further discussion of needs. Patient pain tolerated with prescribed medication administration. After 4 additional attempts to contact the facility to provide report, no connection with a facility member representative was available.  Pt discharged to facility.

## 2014-08-05 NOTE — Progress Notes (Signed)
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  Asssited pt with amb in hallway, assisted to bathroom then performed TE's followed by ICE.  Follow Up Recommendations  SNF ([facility])     Equipment Recommendations       Recommendations for Other Services       Precautions / Restrictions Precautions Precautions: Posterior Hip Precaution Comments: pt recalls 3/3 THP Restrictions Weight Bearing Restrictions: Yes RLE Weight Bearing: Partial weight bearing RLE Partial Weight Bearing Percentage or Pounds: 50%    Mobility  Bed Mobility               General bed mobility comments: Pt OOB in recliner  Transfers Overall transfer level: Needs assistance Equipment used: Rolling walker (2 wheeled) Transfers: Sit to/from Stand Sit to Stand: Min assist;Min guard         General transfer comment: cues for Hand and  R leg position, posterior precautions. Assisted in bathroom.  Ambulation/Gait Ambulation/Gait assistance: Min guard Ambulation Distance (Feet): 78 Feet Assistive device: Rolling walker (2 wheeled) Gait Pattern/deviations: Step-to pattern;Decreased stance time - right Gait velocity: decreased   General Gait Details: pt doing well to adhere PWB.  <25% VC's on safety with turns and increased time.   Stairs            Wheelchair Mobility    Modified Rankin (Stroke Patients Only)       Balance                                    Cognition                            Exercises      General Comments        Pertinent Vitals/Pain      Home Living                      Prior Function            PT Goals (current goals can now be found in the care plan section) Progress towards PT goals: Progressing toward goals     Frequency  7X/week    PT Plan      Co-evaluation             End of Session Equipment Utilized During Treatment: Gait belt Activity Tolerance: Patient tolerated treatment well Patient left: in chair;with call bell/phone within reach;with family/visitor present     Time: 7408-1448 PT Time Calculation (min): 35 min  Charges:  $Gait Training: 8-22 mins $Therapeutic Exercise: 8-22 mins                    G Codes:      Rica Koyanagi  PTA [facility]  Acute  Rehab Pager      [phone]

## 2014-08-06 LAB — TYPE AND SCREEN
ABO/RH(D): A POS
Antibody Screen: POSITIVE
DAT, IgG: POSITIVE
UNIT DIVISION: 0
Unit division: 0

## 2014-08-15 DIAGNOSIS — M859 Disorder of bone density and structure, unspecified: Secondary | ICD-10-CM | POA: Diagnosis not present

## 2014-08-15 DIAGNOSIS — E039 Hypothyroidism, unspecified: Secondary | ICD-10-CM | POA: Diagnosis not present

## 2014-08-15 DIAGNOSIS — E785 Hyperlipidemia, unspecified: Secondary | ICD-10-CM | POA: Diagnosis not present

## 2014-08-15 DIAGNOSIS — I10 Essential (primary) hypertension: Secondary | ICD-10-CM | POA: Diagnosis not present

## 2014-08-15 DIAGNOSIS — M2610 Unspecified anomaly of jaw-cranial base relationship: Secondary | ICD-10-CM | POA: Diagnosis not present

## 2014-08-15 DIAGNOSIS — Z9181 History of falling: Secondary | ICD-10-CM | POA: Diagnosis not present

## 2014-08-15 DIAGNOSIS — I87309 Chronic venous hypertension (idiopathic) without complications of unspecified lower extremity: Secondary | ICD-10-CM | POA: Diagnosis not present

## 2014-08-15 DIAGNOSIS — T84060S Wear of articular bearing surface of internal prosthetic right hip joint, sequela: Secondary | ICD-10-CM | POA: Diagnosis not present

## 2014-08-15 DIAGNOSIS — E079 Disorder of thyroid, unspecified: Secondary | ICD-10-CM | POA: Diagnosis not present

## 2014-08-15 DIAGNOSIS — K219 Gastro-esophageal reflux disease without esophagitis: Secondary | ICD-10-CM | POA: Diagnosis not present

## 2014-08-15 DIAGNOSIS — K5790 Diverticulosis of intestine, part unspecified, without perforation or abscess without bleeding: Secondary | ICD-10-CM | POA: Diagnosis not present

## 2014-08-15 DIAGNOSIS — Z96641 Presence of right artificial hip joint: Secondary | ICD-10-CM | POA: Diagnosis not present

## 2014-08-15 DIAGNOSIS — Z4789 Encounter for other orthopedic aftercare: Secondary | ICD-10-CM | POA: Diagnosis not present

## 2014-08-19 DIAGNOSIS — M2610 Unspecified anomaly of jaw-cranial base relationship: Secondary | ICD-10-CM | POA: Diagnosis not present

## 2014-08-19 DIAGNOSIS — K219 Gastro-esophageal reflux disease without esophagitis: Secondary | ICD-10-CM | POA: Diagnosis not present

## 2014-08-19 DIAGNOSIS — E039 Hypothyroidism, unspecified: Secondary | ICD-10-CM | POA: Diagnosis not present

## 2014-08-19 DIAGNOSIS — I87309 Chronic venous hypertension (idiopathic) without complications of unspecified lower extremity: Secondary | ICD-10-CM | POA: Diagnosis not present

## 2014-08-27 DIAGNOSIS — B0229 Other postherpetic nervous system involvement: Secondary | ICD-10-CM | POA: Diagnosis not present

## 2014-08-27 DIAGNOSIS — Z4732 Aftercare following explantation of hip joint prosthesis: Secondary | ICD-10-CM | POA: Diagnosis not present

## 2014-08-27 DIAGNOSIS — M8580 Other specified disorders of bone density and structure, unspecified site: Secondary | ICD-10-CM | POA: Diagnosis not present

## 2014-08-27 DIAGNOSIS — Z96643 Presence of artificial hip joint, bilateral: Secondary | ICD-10-CM | POA: Diagnosis not present

## 2014-08-27 DIAGNOSIS — Z96659 Presence of unspecified artificial knee joint: Secondary | ICD-10-CM | POA: Diagnosis not present

## 2014-08-27 DIAGNOSIS — I10 Essential (primary) hypertension: Secondary | ICD-10-CM | POA: Diagnosis not present

## 2014-08-30 ENCOUNTER — Telehealth: Payer: Self-pay | Admitting: Family Medicine

## 2014-08-30 DIAGNOSIS — Z96643 Presence of artificial hip joint, bilateral: Secondary | ICD-10-CM | POA: Diagnosis not present

## 2014-08-30 DIAGNOSIS — M8580 Other specified disorders of bone density and structure, unspecified site: Secondary | ICD-10-CM | POA: Diagnosis not present

## 2014-08-30 DIAGNOSIS — I10 Essential (primary) hypertension: Secondary | ICD-10-CM | POA: Diagnosis not present

## 2014-08-30 DIAGNOSIS — Z96659 Presence of unspecified artificial knee joint: Secondary | ICD-10-CM | POA: Diagnosis not present

## 2014-08-30 DIAGNOSIS — Z4732 Aftercare following explantation of hip joint prosthesis: Secondary | ICD-10-CM | POA: Diagnosis not present

## 2014-08-30 DIAGNOSIS — B0229 Other postherpetic nervous system involvement: Secondary | ICD-10-CM | POA: Diagnosis not present

## 2014-08-30 NOTE — Telephone Encounter (Signed)
Carolyn Henderson saw pt on 08-27-14. [name]  is  Calling for verbal order for  skill nursing twice a wk for 2 wks and once a wk for 1 wk. For medical and medication management.

## 2014-08-30 NOTE — Telephone Encounter (Signed)
OK to set up.  She had recent hip surgery.

## 2014-08-31 DIAGNOSIS — M8580 Other specified disorders of bone density and structure, unspecified site: Secondary | ICD-10-CM | POA: Diagnosis not present

## 2014-08-31 DIAGNOSIS — Z96643 Presence of artificial hip joint, bilateral: Secondary | ICD-10-CM | POA: Diagnosis not present

## 2014-08-31 DIAGNOSIS — Z96659 Presence of unspecified artificial knee joint: Secondary | ICD-10-CM | POA: Diagnosis not present

## 2014-08-31 DIAGNOSIS — I10 Essential (primary) hypertension: Secondary | ICD-10-CM | POA: Diagnosis not present

## 2014-08-31 DIAGNOSIS — B0229 Other postherpetic nervous system involvement: Secondary | ICD-10-CM | POA: Diagnosis not present

## 2014-08-31 DIAGNOSIS — Z4732 Aftercare following explantation of hip joint prosthesis: Secondary | ICD-10-CM | POA: Diagnosis not present

## 2014-08-31 NOTE — Telephone Encounter (Signed)
Left message for Angela to return call

## 2014-09-01 DIAGNOSIS — B0229 Other postherpetic nervous system involvement: Secondary | ICD-10-CM | POA: Diagnosis not present

## 2014-09-01 DIAGNOSIS — Z96659 Presence of unspecified artificial knee joint: Secondary | ICD-10-CM | POA: Diagnosis not present

## 2014-09-01 DIAGNOSIS — Z4732 Aftercare following explantation of hip joint prosthesis: Secondary | ICD-10-CM | POA: Diagnosis not present

## 2014-09-01 DIAGNOSIS — Z96643 Presence of artificial hip joint, bilateral: Secondary | ICD-10-CM | POA: Diagnosis not present

## 2014-09-01 DIAGNOSIS — I10 Essential (primary) hypertension: Secondary | ICD-10-CM | POA: Diagnosis not present

## 2014-09-01 DIAGNOSIS — M8580 Other specified disorders of bone density and structure, unspecified site: Secondary | ICD-10-CM | POA: Diagnosis not present

## 2014-09-01 NOTE — Telephone Encounter (Signed)
Angela is informed.  

## 2014-09-01 NOTE — Telephone Encounter (Signed)
Left message for Carolyn Henderson to return call

## 2014-09-02 DIAGNOSIS — Z96659 Presence of unspecified artificial knee joint: Secondary | ICD-10-CM | POA: Diagnosis not present

## 2014-09-02 DIAGNOSIS — I10 Essential (primary) hypertension: Secondary | ICD-10-CM | POA: Diagnosis not present

## 2014-09-02 DIAGNOSIS — Z96643 Presence of artificial hip joint, bilateral: Secondary | ICD-10-CM | POA: Diagnosis not present

## 2014-09-02 DIAGNOSIS — Z4732 Aftercare following explantation of hip joint prosthesis: Secondary | ICD-10-CM | POA: Diagnosis not present

## 2014-09-02 DIAGNOSIS — B0229 Other postherpetic nervous system involvement: Secondary | ICD-10-CM | POA: Diagnosis not present

## 2014-09-02 DIAGNOSIS — M8580 Other specified disorders of bone density and structure, unspecified site: Secondary | ICD-10-CM | POA: Diagnosis not present

## 2014-09-03 DIAGNOSIS — B0229 Other postherpetic nervous system involvement: Secondary | ICD-10-CM | POA: Diagnosis not present

## 2014-09-03 DIAGNOSIS — I10 Essential (primary) hypertension: Secondary | ICD-10-CM | POA: Diagnosis not present

## 2014-09-03 DIAGNOSIS — M8580 Other specified disorders of bone density and structure, unspecified site: Secondary | ICD-10-CM | POA: Diagnosis not present

## 2014-09-03 DIAGNOSIS — Z4732 Aftercare following explantation of hip joint prosthesis: Secondary | ICD-10-CM | POA: Diagnosis not present

## 2014-09-03 DIAGNOSIS — Z96659 Presence of unspecified artificial knee joint: Secondary | ICD-10-CM | POA: Diagnosis not present

## 2014-09-03 DIAGNOSIS — Z96643 Presence of artificial hip joint, bilateral: Secondary | ICD-10-CM | POA: Diagnosis not present

## 2014-09-05 DIAGNOSIS — M8580 Other specified disorders of bone density and structure, unspecified site: Secondary | ICD-10-CM | POA: Diagnosis not present

## 2014-09-05 DIAGNOSIS — Z4732 Aftercare following explantation of hip joint prosthesis: Secondary | ICD-10-CM | POA: Diagnosis not present

## 2014-09-05 DIAGNOSIS — B0229 Other postherpetic nervous system involvement: Secondary | ICD-10-CM | POA: Diagnosis not present

## 2014-09-05 DIAGNOSIS — I10 Essential (primary) hypertension: Secondary | ICD-10-CM | POA: Diagnosis not present

## 2014-09-05 DIAGNOSIS — Z96659 Presence of unspecified artificial knee joint: Secondary | ICD-10-CM | POA: Diagnosis not present

## 2014-09-05 DIAGNOSIS — Z96643 Presence of artificial hip joint, bilateral: Secondary | ICD-10-CM | POA: Diagnosis not present

## 2014-09-06 DIAGNOSIS — Z4732 Aftercare following explantation of hip joint prosthesis: Secondary | ICD-10-CM | POA: Diagnosis not present

## 2014-09-06 DIAGNOSIS — B0229 Other postherpetic nervous system involvement: Secondary | ICD-10-CM | POA: Diagnosis not present

## 2014-09-06 DIAGNOSIS — Z96659 Presence of unspecified artificial knee joint: Secondary | ICD-10-CM | POA: Diagnosis not present

## 2014-09-06 DIAGNOSIS — I10 Essential (primary) hypertension: Secondary | ICD-10-CM | POA: Diagnosis not present

## 2014-09-06 DIAGNOSIS — Z96643 Presence of artificial hip joint, bilateral: Secondary | ICD-10-CM | POA: Diagnosis not present

## 2014-09-06 DIAGNOSIS — M8580 Other specified disorders of bone density and structure, unspecified site: Secondary | ICD-10-CM | POA: Diagnosis not present

## 2014-09-07 DIAGNOSIS — Z4732 Aftercare following explantation of hip joint prosthesis: Secondary | ICD-10-CM | POA: Diagnosis not present

## 2014-09-07 DIAGNOSIS — B0229 Other postherpetic nervous system involvement: Secondary | ICD-10-CM | POA: Diagnosis not present

## 2014-09-07 DIAGNOSIS — M8580 Other specified disorders of bone density and structure, unspecified site: Secondary | ICD-10-CM | POA: Diagnosis not present

## 2014-09-07 DIAGNOSIS — Z96659 Presence of unspecified artificial knee joint: Secondary | ICD-10-CM | POA: Diagnosis not present

## 2014-09-07 DIAGNOSIS — I10 Essential (primary) hypertension: Secondary | ICD-10-CM | POA: Diagnosis not present

## 2014-09-07 DIAGNOSIS — Z96643 Presence of artificial hip joint, bilateral: Secondary | ICD-10-CM | POA: Diagnosis not present

## 2014-09-08 DIAGNOSIS — B0229 Other postherpetic nervous system involvement: Secondary | ICD-10-CM | POA: Diagnosis not present

## 2014-09-08 DIAGNOSIS — Z96643 Presence of artificial hip joint, bilateral: Secondary | ICD-10-CM | POA: Diagnosis not present

## 2014-09-08 DIAGNOSIS — M8580 Other specified disorders of bone density and structure, unspecified site: Secondary | ICD-10-CM | POA: Diagnosis not present

## 2014-09-08 DIAGNOSIS — Z4732 Aftercare following explantation of hip joint prosthesis: Secondary | ICD-10-CM | POA: Diagnosis not present

## 2014-09-08 DIAGNOSIS — I10 Essential (primary) hypertension: Secondary | ICD-10-CM | POA: Diagnosis not present

## 2014-09-08 DIAGNOSIS — Z96659 Presence of unspecified artificial knee joint: Secondary | ICD-10-CM | POA: Diagnosis not present

## 2014-09-10 DIAGNOSIS — Z96659 Presence of unspecified artificial knee joint: Secondary | ICD-10-CM | POA: Diagnosis not present

## 2014-09-10 DIAGNOSIS — Z4732 Aftercare following explantation of hip joint prosthesis: Secondary | ICD-10-CM | POA: Diagnosis not present

## 2014-09-10 DIAGNOSIS — I10 Essential (primary) hypertension: Secondary | ICD-10-CM | POA: Diagnosis not present

## 2014-09-10 DIAGNOSIS — B0229 Other postherpetic nervous system involvement: Secondary | ICD-10-CM | POA: Diagnosis not present

## 2014-09-10 DIAGNOSIS — Z96643 Presence of artificial hip joint, bilateral: Secondary | ICD-10-CM | POA: Diagnosis not present

## 2014-09-10 DIAGNOSIS — M8580 Other specified disorders of bone density and structure, unspecified site: Secondary | ICD-10-CM | POA: Diagnosis not present

## 2014-09-13 DIAGNOSIS — Z96659 Presence of unspecified artificial knee joint: Secondary | ICD-10-CM | POA: Diagnosis not present

## 2014-09-13 DIAGNOSIS — M8580 Other specified disorders of bone density and structure, unspecified site: Secondary | ICD-10-CM | POA: Diagnosis not present

## 2014-09-13 DIAGNOSIS — Z96643 Presence of artificial hip joint, bilateral: Secondary | ICD-10-CM | POA: Diagnosis not present

## 2014-09-13 DIAGNOSIS — B0229 Other postherpetic nervous system involvement: Secondary | ICD-10-CM | POA: Diagnosis not present

## 2014-09-13 DIAGNOSIS — Z4732 Aftercare following explantation of hip joint prosthesis: Secondary | ICD-10-CM | POA: Diagnosis not present

## 2014-09-13 DIAGNOSIS — I10 Essential (primary) hypertension: Secondary | ICD-10-CM | POA: Diagnosis not present

## 2014-09-14 DIAGNOSIS — Z96659 Presence of unspecified artificial knee joint: Secondary | ICD-10-CM | POA: Diagnosis not present

## 2014-09-14 DIAGNOSIS — M8580 Other specified disorders of bone density and structure, unspecified site: Secondary | ICD-10-CM | POA: Diagnosis not present

## 2014-09-14 DIAGNOSIS — B0229 Other postherpetic nervous system involvement: Secondary | ICD-10-CM | POA: Diagnosis not present

## 2014-09-14 DIAGNOSIS — Z4732 Aftercare following explantation of hip joint prosthesis: Secondary | ICD-10-CM | POA: Diagnosis not present

## 2014-09-14 DIAGNOSIS — Z96643 Presence of artificial hip joint, bilateral: Secondary | ICD-10-CM | POA: Diagnosis not present

## 2014-09-14 DIAGNOSIS — I10 Essential (primary) hypertension: Secondary | ICD-10-CM | POA: Diagnosis not present

## 2014-09-15 DIAGNOSIS — Z4732 Aftercare following explantation of hip joint prosthesis: Secondary | ICD-10-CM | POA: Diagnosis not present

## 2014-09-15 DIAGNOSIS — B0229 Other postherpetic nervous system involvement: Secondary | ICD-10-CM | POA: Diagnosis not present

## 2014-09-15 DIAGNOSIS — Z96643 Presence of artificial hip joint, bilateral: Secondary | ICD-10-CM | POA: Diagnosis not present

## 2014-09-15 DIAGNOSIS — Z96659 Presence of unspecified artificial knee joint: Secondary | ICD-10-CM | POA: Diagnosis not present

## 2014-09-15 DIAGNOSIS — I10 Essential (primary) hypertension: Secondary | ICD-10-CM | POA: Diagnosis not present

## 2014-09-15 DIAGNOSIS — M8580 Other specified disorders of bone density and structure, unspecified site: Secondary | ICD-10-CM | POA: Diagnosis not present

## 2014-09-16 DIAGNOSIS — Z96659 Presence of unspecified artificial knee joint: Secondary | ICD-10-CM | POA: Diagnosis not present

## 2014-09-16 DIAGNOSIS — Z4732 Aftercare following explantation of hip joint prosthesis: Secondary | ICD-10-CM | POA: Diagnosis not present

## 2014-09-16 DIAGNOSIS — I10 Essential (primary) hypertension: Secondary | ICD-10-CM | POA: Diagnosis not present

## 2014-09-16 DIAGNOSIS — B0229 Other postherpetic nervous system involvement: Secondary | ICD-10-CM | POA: Diagnosis not present

## 2014-09-16 DIAGNOSIS — Z96643 Presence of artificial hip joint, bilateral: Secondary | ICD-10-CM | POA: Diagnosis not present

## 2014-09-16 DIAGNOSIS — M8580 Other specified disorders of bone density and structure, unspecified site: Secondary | ICD-10-CM | POA: Diagnosis not present

## 2014-09-21 DIAGNOSIS — I10 Essential (primary) hypertension: Secondary | ICD-10-CM | POA: Diagnosis not present

## 2014-09-21 DIAGNOSIS — Z4732 Aftercare following explantation of hip joint prosthesis: Secondary | ICD-10-CM | POA: Diagnosis not present

## 2014-09-21 DIAGNOSIS — Z96643 Presence of artificial hip joint, bilateral: Secondary | ICD-10-CM | POA: Diagnosis not present

## 2014-09-21 DIAGNOSIS — Z96659 Presence of unspecified artificial knee joint: Secondary | ICD-10-CM | POA: Diagnosis not present

## 2014-09-21 DIAGNOSIS — M8580 Other specified disorders of bone density and structure, unspecified site: Secondary | ICD-10-CM | POA: Diagnosis not present

## 2014-09-21 DIAGNOSIS — B0229 Other postherpetic nervous system involvement: Secondary | ICD-10-CM | POA: Diagnosis not present

## 2014-09-23 DIAGNOSIS — Z471 Aftercare following joint replacement surgery: Secondary | ICD-10-CM | POA: Diagnosis not present

## 2014-09-23 DIAGNOSIS — Z96641 Presence of right artificial hip joint: Secondary | ICD-10-CM | POA: Diagnosis not present

## 2014-09-24 DIAGNOSIS — I10 Essential (primary) hypertension: Secondary | ICD-10-CM | POA: Diagnosis not present

## 2014-09-24 DIAGNOSIS — M8580 Other specified disorders of bone density and structure, unspecified site: Secondary | ICD-10-CM | POA: Diagnosis not present

## 2014-09-24 DIAGNOSIS — Z96659 Presence of unspecified artificial knee joint: Secondary | ICD-10-CM | POA: Diagnosis not present

## 2014-09-24 DIAGNOSIS — B0229 Other postherpetic nervous system involvement: Secondary | ICD-10-CM | POA: Diagnosis not present

## 2014-09-24 DIAGNOSIS — Z4732 Aftercare following explantation of hip joint prosthesis: Secondary | ICD-10-CM | POA: Diagnosis not present

## 2014-09-24 DIAGNOSIS — Z96643 Presence of artificial hip joint, bilateral: Secondary | ICD-10-CM | POA: Diagnosis not present

## 2014-09-27 ENCOUNTER — Encounter: Payer: Self-pay | Admitting: Family Medicine

## 2014-09-27 ENCOUNTER — Ambulatory Visit (INDEPENDENT_AMBULATORY_CARE_PROVIDER_SITE_OTHER): Payer: Medicare Other | Admitting: Family Medicine

## 2014-09-27 VITALS — BP 132/78 | HR 76 | Temp 97.2°F | Wt 158.0 lb

## 2014-09-27 DIAGNOSIS — I10 Essential (primary) hypertension: Secondary | ICD-10-CM

## 2014-09-27 DIAGNOSIS — Z96643 Presence of artificial hip joint, bilateral: Secondary | ICD-10-CM | POA: Diagnosis not present

## 2014-09-27 DIAGNOSIS — T148 Other injury of unspecified body region: Secondary | ICD-10-CM | POA: Diagnosis not present

## 2014-09-27 DIAGNOSIS — M8580 Other specified disorders of bone density and structure, unspecified site: Secondary | ICD-10-CM | POA: Diagnosis not present

## 2014-09-27 DIAGNOSIS — Z96659 Presence of unspecified artificial knee joint: Secondary | ICD-10-CM | POA: Diagnosis not present

## 2014-09-27 DIAGNOSIS — B0229 Other postherpetic nervous system involvement: Secondary | ICD-10-CM | POA: Diagnosis not present

## 2014-09-27 DIAGNOSIS — T148XXA Other injury of unspecified body region, initial encounter: Secondary | ICD-10-CM

## 2014-09-27 DIAGNOSIS — Z4732 Aftercare following explantation of hip joint prosthesis: Secondary | ICD-10-CM | POA: Diagnosis not present

## 2014-09-27 NOTE — Progress Notes (Signed)
Pre visit review using our clinic review tool, if applicable. No additional management support is needed unless otherwise documented below in the visit note. 

## 2014-09-27 NOTE — Progress Notes (Signed)
Subjective:    Patient ID: Carolyn Henderson, female    DOB: 10/27/1929, 80 y.o.   MRN: [id number]  HPI Patient is seen with swelling and bruising over her sacral region. Her recent history is that she had revision of right hip replacement this past fall. She had three-week rehabilitation stent. Last Thursday she was going back to orthopedist for follow-up in the process of getting x-rays she was rolled onto her back and felt a sharp pain in that region. She subsequently developed some bruising and swelling in her sacral region. She does not recall any fall or injury otherwise. She has continued with physical therapy and has been able to ambulate with assistance of walker or cane. She's noticed some progressive bruising her right buttock region.  She had recently developed some left lateral hip pains and was seen by orthopedist last week and had injection into her bursa region. No other bleeding couple locations. She has not taken any aspirin over the past week.  BP has been stable on low dose Amlodipine and HCTZ.  No headaches.  Compliant with meds.  Past Medical History  Diagnosis Date  . Hypertension   . Hyperlipidemia   . Thyroid disease   . Arthritis   . Complication of anesthesia   . PONV (postoperative nausea and vomiting)   . Shortness of breath     WITH EXERTION  . Osteopenia   . Precancerous skin lesion   . Diverticulosis   . GERD (gastroesophageal reflux disease)     ONLY OCCASIONAL  . Difficulty sleeping     TAKES TYLENOL PM NIGHTLY  . History of shingles    Past Surgical History  Procedure Laterality Date  . Cataract extraction    . Total hip arthroplasty  1999 / 2007    BILATERAL  . Abdominal hysterectomy    . Lumbar laminectomy    . Tonsillectomy    . Colon surgery  1983    COLON RESECTION  . Back surgery    . Joint replacement      TOTAL KNEE 2012 / BIL TOTAL HIPS   . Total hip revision Right 08/02/2014    Procedure: RIGHT TOTAL HIP REVISION;  Surgeon:  Mauri Pole, MD;  Location: [facility];  Service: Orthopedics;  Laterality: Right;    reports that she quit smoking about 22 years ago. She does not have any smokeless tobacco history on file. She reports that she does not drink alcohol or use illicit drugs. family history includes Cancer in an other family member; Hypertension in an other family member. Allergies  Allergen Reactions  . Cephalosporins     Cant remember  . Codeine Sulfate Nausea And Vomiting  . Hydrocodone     Sores in mouth, itiching  . Penicillins Rash      Review of Systems  Constitutional: Negative for fever, chills and fatigue.  Eyes: Negative for visual disturbance.  Respiratory: Negative for cough, chest tightness, shortness of breath and wheezing.   Cardiovascular: Negative for chest pain, palpitations and leg swelling.  Genitourinary: Negative for dysuria.  Musculoskeletal: Positive for back pain.  Neurological: Negative for dizziness, seizures, syncope, weakness, light-headedness, numbness and headaches.       Objective:   Physical Exam  Constitutional: She appears well-developed and well-nourished.  Cardiovascular: Normal rate and regular rhythm.   Pulmonary/Chest: Effort normal and breath sounds normal. No respiratory distress. She has no wheezes. She has no rales.  Musculoskeletal:  She has some soft tissue swelling of  the sacral region and some induration. There is no erythema. No significant warmth. Nonspecific mild tenderness. No overlying skin rashes.          Assessment & Plan:  She appears to have hematoma sacral region. She has some ecchymosis into the right gluteus region. She is ambulating without difficulty with assistance of walker. Temporary ice used first couple days we recommended now transitioning to low-grade heat. Reassess in one week, sooner if she has any redness or signs of secondary infection Hypertension -stable.

## 2014-09-27 NOTE — Patient Instructions (Signed)

## 2014-09-29 DIAGNOSIS — Z96643 Presence of artificial hip joint, bilateral: Secondary | ICD-10-CM | POA: Diagnosis not present

## 2014-09-29 DIAGNOSIS — I10 Essential (primary) hypertension: Secondary | ICD-10-CM | POA: Diagnosis not present

## 2014-09-29 DIAGNOSIS — Z4732 Aftercare following explantation of hip joint prosthesis: Secondary | ICD-10-CM | POA: Diagnosis not present

## 2014-09-29 DIAGNOSIS — B0229 Other postherpetic nervous system involvement: Secondary | ICD-10-CM | POA: Diagnosis not present

## 2014-09-29 DIAGNOSIS — Z96659 Presence of unspecified artificial knee joint: Secondary | ICD-10-CM | POA: Diagnosis not present

## 2014-09-29 DIAGNOSIS — M8580 Other specified disorders of bone density and structure, unspecified site: Secondary | ICD-10-CM | POA: Diagnosis not present

## 2014-10-04 ENCOUNTER — Encounter: Payer: Self-pay | Admitting: Family Medicine

## 2014-10-04 ENCOUNTER — Ambulatory Visit (INDEPENDENT_AMBULATORY_CARE_PROVIDER_SITE_OTHER): Payer: Medicare Other | Admitting: Family Medicine

## 2014-10-04 VITALS — BP 130/72 | HR 81 | Temp 97.3°F | Wt 158.0 lb

## 2014-10-04 DIAGNOSIS — S300XXD Contusion of lower back and pelvis, subsequent encounter: Secondary | ICD-10-CM | POA: Diagnosis not present

## 2014-10-04 NOTE — Progress Notes (Signed)
Pre visit review using our clinic review tool, if applicable. No additional management support is needed unless otherwise documented below in the visit note. 

## 2014-10-04 NOTE — Progress Notes (Signed)
   Subjective:    Patient ID: Carolyn Henderson, female    DOB: 20-Sep-1930, 80 y.o.   MRN: [id number]  HPI Patient seen for follow-up regarding recent fall. She had sacral hematoma with bruising into the right gluteus and upper thigh region. She was actually doing very well until this morning when she had another fall. This occurred because her walker was not locked and she fell back on her sacral area again. She is ambulating without difficulty with walker. She applied ice. Minimal pain. She has had close follow-up with orthopedist regarding her recent hip revision surgery.  Past Medical History  Diagnosis Date  . Hypertension   . Hyperlipidemia   . Thyroid disease   . Arthritis   . Complication of anesthesia   . PONV (postoperative nausea and vomiting)   . Shortness of breath     WITH EXERTION  . Osteopenia   . Precancerous skin lesion   . Diverticulosis   . GERD (gastroesophageal reflux disease)     ONLY OCCASIONAL  . Difficulty sleeping     TAKES TYLENOL PM NIGHTLY  . History of shingles    Past Surgical History  Procedure Laterality Date  . Cataract extraction    . Total hip arthroplasty  1999 / 2007    BILATERAL  . Abdominal hysterectomy    . Lumbar laminectomy    . Tonsillectomy    . Colon surgery  1983    COLON RESECTION  . Back surgery    . Joint replacement      TOTAL KNEE 2012 / BIL TOTAL HIPS   . Total hip revision Right 08/02/2014    Procedure: RIGHT TOTAL HIP REVISION;  Surgeon: Mauri Pole, MD;  Location: [facility];  Service: Orthopedics;  Laterality: Right;    reports that she quit smoking about 22 years ago. She does not have any smokeless tobacco history on file. She reports that she does not drink alcohol or use illicit drugs. family history includes Cancer in an other family member; Hypertension in an other family member. Allergies  Allergen Reactions  . Cephalosporins     Cant remember  . Codeine Sulfate Nausea And Vomiting  . Hydrocodone     Sores  in mouth, itiching  . Penicillins Rash      Review of Systems  Neurological: Negative for dizziness and weakness.  Psychiatric/Behavioral: Negative for confusion.       Objective:   Physical Exam  Constitutional: She appears well-developed and well-nourished.  Cardiovascular: Normal rate and regular rhythm.   Pulmonary/Chest: Effort normal and breath sounds normal. No respiratory distress. She has no wheezes. She has no rales.  Skin:  She has some swelling and mild induration of her sacral region and some ecchymosis which extends from this region down toward the right buttock. Essentially nontender palpation. No warmth. No erythema. No skin breakdown          Assessment & Plan:  Hematoma sacral region. Gradually resolving. Continued heat for symptomatic relief. Follow-up as needed

## 2014-10-08 ENCOUNTER — Emergency Department (HOSPITAL_COMMUNITY): Payer: Medicare Other

## 2014-10-08 ENCOUNTER — Encounter (HOSPITAL_COMMUNITY): Payer: Self-pay | Admitting: Emergency Medicine

## 2014-10-08 ENCOUNTER — Inpatient Hospital Stay (HOSPITAL_COMMUNITY)
Admission: EM | Admit: 2014-10-08 | Discharge: 2014-10-11 | DRG: 552 | Disposition: A | Discharge status: Skilled Nursing Facility | Payer: Medicare Other | Attending: Internal Medicine | Admitting: Internal Medicine

## 2014-10-08 DIAGNOSIS — I10 Essential (primary) hypertension: Secondary | ICD-10-CM | POA: Diagnosis present

## 2014-10-08 DIAGNOSIS — Z79899 Other long term (current) drug therapy: Secondary | ICD-10-CM | POA: Diagnosis not present

## 2014-10-08 DIAGNOSIS — S3210XA Unspecified fracture of sacrum, initial encounter for closed fracture: Principal | ICD-10-CM | POA: Diagnosis present

## 2014-10-08 DIAGNOSIS — M1990 Unspecified osteoarthritis, unspecified site: Secondary | ICD-10-CM | POA: Diagnosis present

## 2014-10-08 DIAGNOSIS — E860 Dehydration: Secondary | ICD-10-CM | POA: Diagnosis present

## 2014-10-08 DIAGNOSIS — R102 Pelvic and perineal pain: Secondary | ICD-10-CM | POA: Diagnosis not present

## 2014-10-08 DIAGNOSIS — Z9181 History of falling: Secondary | ICD-10-CM | POA: Diagnosis not present

## 2014-10-08 DIAGNOSIS — Z96659 Presence of unspecified artificial knee joint: Secondary | ICD-10-CM | POA: Diagnosis present

## 2014-10-08 DIAGNOSIS — W19XXXA Unspecified fall, initial encounter: Secondary | ICD-10-CM | POA: Diagnosis present

## 2014-10-08 DIAGNOSIS — S3210XD Unspecified fracture of sacrum, subsequent encounter for fracture with routine healing: Secondary | ICD-10-CM | POA: Diagnosis not present

## 2014-10-08 DIAGNOSIS — K219 Gastro-esophageal reflux disease without esophagitis: Secondary | ICD-10-CM | POA: Diagnosis present

## 2014-10-08 DIAGNOSIS — Y92009 Unspecified place in unspecified non-institutional (private) residence as the place of occurrence of the external cause: Secondary | ICD-10-CM

## 2014-10-08 DIAGNOSIS — E871 Hypo-osmolality and hyponatremia: Secondary | ICD-10-CM | POA: Diagnosis present

## 2014-10-08 DIAGNOSIS — E785 Hyperlipidemia, unspecified: Secondary | ICD-10-CM | POA: Diagnosis present

## 2014-10-08 DIAGNOSIS — G2581 Restless legs syndrome: Secondary | ICD-10-CM | POA: Diagnosis present

## 2014-10-08 DIAGNOSIS — Z96643 Presence of artificial hip joint, bilateral: Secondary | ICD-10-CM | POA: Diagnosis present

## 2014-10-08 DIAGNOSIS — S79911A Unspecified injury of right hip, initial encounter: Secondary | ICD-10-CM | POA: Diagnosis not present

## 2014-10-08 DIAGNOSIS — M25551 Pain in right hip: Secondary | ICD-10-CM | POA: Diagnosis not present

## 2014-10-08 DIAGNOSIS — M25511 Pain in right shoulder: Secondary | ICD-10-CM | POA: Diagnosis not present

## 2014-10-08 DIAGNOSIS — Z87891 Personal history of nicotine dependence: Secondary | ICD-10-CM

## 2014-10-08 DIAGNOSIS — S3992XA Unspecified injury of lower back, initial encounter: Secondary | ICD-10-CM | POA: Diagnosis not present

## 2014-10-08 DIAGNOSIS — M545 Low back pain: Secondary | ICD-10-CM | POA: Diagnosis not present

## 2014-10-08 DIAGNOSIS — S3219XA Other fracture of sacrum, initial encounter for closed fracture: Secondary | ICD-10-CM | POA: Diagnosis not present

## 2014-10-08 DIAGNOSIS — R259 Unspecified abnormal involuntary movements: Secondary | ICD-10-CM | POA: Diagnosis not present

## 2014-10-08 DIAGNOSIS — Z9849 Cataract extraction status, unspecified eye: Secondary | ICD-10-CM | POA: Diagnosis not present

## 2014-10-08 DIAGNOSIS — R531 Weakness: Secondary | ICD-10-CM | POA: Diagnosis not present

## 2014-10-08 HISTORY — DX: Unspecified fracture of sacrum, initial encounter for closed fracture: S32.10XA

## 2014-10-08 MED ORDER — CALCIUM CARBONATE-VITAMIN D 500-200 MG-UNIT PO TABS
1.0000 | ORAL_TABLET | Freq: Two times a day (BID) | ORAL | Status: DC
  Administered 2014-10-09 – 2014-10-11 (×5): 1 via ORAL
  Filled 2014-10-08 (×6): qty 1

## 2014-10-08 MED ORDER — LORATADINE 10 MG PO TABS: 10.0000 mg | ORAL_TABLET | Freq: Every day | ORAL | Status: DC

## 2014-10-08 MED ORDER — VITAMIN D3 25 MCG (1000 UNIT) PO TABS
1000.0000 [IU] | ORAL_TABLET | Freq: Every day | ORAL | Status: DC
  Administered 2014-10-09 – 2014-10-11 (×3): 1000 [IU] via ORAL
  Filled 2014-10-08 (×3): qty 1

## 2014-10-08 MED ORDER — IBUPROFEN 200 MG PO TABS
200.0000 mg | ORAL_TABLET | Freq: Three times a day (TID) | ORAL | Status: DC | PRN
  Administered 2014-10-09 (×2): 200 mg via ORAL
  Filled 2014-10-08 (×2): qty 1

## 2014-10-08 MED ORDER — VITAMIN B-12 1000 MCG PO TABS
1000.0000 ug | ORAL_TABLET | Freq: Every day | ORAL | Status: DC
  Administered 2014-10-09 – 2014-10-11 (×3): 1000 ug via ORAL
  Filled 2014-10-08 (×3): qty 1

## 2014-10-08 MED ORDER — GABAPENTIN 300 MG PO CAPS
300.0000 mg | ORAL_CAPSULE | Freq: Two times a day (BID) | ORAL | Status: DC
  Administered 2014-10-09 – 2014-10-11 (×6): 300 mg via ORAL
  Filled 2014-10-08 (×7): qty 1

## 2014-10-08 MED ORDER — PRESERVISION AREDS 2 PO CAPS: 1.0000 | ORAL_CAPSULE | Freq: Two times a day (BID) | ORAL | Status: DC

## 2014-10-08 MED ORDER — LEVOTHYROXINE SODIUM 125 MCG PO TABS
125.0000 ug | ORAL_TABLET | Freq: Every day | ORAL | Status: DC
  Administered 2014-10-09 – 2014-10-11 (×3): 125 ug via ORAL
  Filled 2014-10-08 (×4): qty 1

## 2014-10-08 MED ORDER — AMLODIPINE BESYLATE 2.5 MG PO TABS
2.5000 mg | ORAL_TABLET | Freq: Every evening | ORAL | Status: DC
  Administered 2014-10-09 – 2014-10-10 (×3): 2.5 mg via ORAL
  Filled 2014-10-08 (×5): qty 1

## 2014-10-08 MED ORDER — POLYETHYL GLYCOL-PROPYL GLYCOL 0.4-0.3 % OP SOLN: Freq: Three times a day (TID) | OPHTHALMIC | Status: DC | PRN

## 2014-10-08 MED ORDER — ACETAMINOPHEN 325 MG PO TABS
650.0000 mg | ORAL_TABLET | Freq: Four times a day (QID) | ORAL | Status: DC | PRN
  Administered 2014-10-09 – 2014-10-10 (×2): 650 mg via ORAL
  Filled 2014-10-08 (×2): qty 2

## 2014-10-08 MED ORDER — HYDROCHLOROTHIAZIDE 25 MG PO TABS
25.0000 mg | ORAL_TABLET | Freq: Every morning | ORAL | Status: DC
  Administered 2014-10-09 – 2014-10-11 (×3): 25 mg via ORAL
  Filled 2014-10-08 (×3): qty 1

## 2014-10-08 MED ORDER — LORATADINE 10 MG PO TABS
10.0000 mg | ORAL_TABLET | Freq: Every day | ORAL | Status: DC
  Administered 2014-10-09 – 2014-10-11 (×3): 10 mg via ORAL
  Filled 2014-10-08 (×4): qty 1

## 2014-10-08 MED ORDER — HEPARIN SODIUM (PORCINE) 5000 UNIT/ML IJ SOLN: 5000.0000 [IU] | Freq: Three times a day (TID) | INTRAMUSCULAR | Status: DC

## 2014-10-08 MED ORDER — HYDROCODONE-ACETAMINOPHEN 5-325 MG PO TABS
1.0000 | ORAL_TABLET | ORAL | Status: DC | PRN
  Administered 2014-10-10 – 2014-10-11 (×4): 1 via ORAL
  Administered 2014-10-11: 2 via ORAL
  Filled 2014-10-08 (×2): qty 1
  Filled 2014-10-08: qty 2
  Filled 2014-10-08 (×2): qty 1

## 2014-10-08 MED ORDER — DIAZEPAM 5 MG PO TABS
5.0000 mg | ORAL_TABLET | Freq: Once | ORAL | Status: AC
  Administered 2014-10-08: 5 mg via ORAL
  Filled 2014-10-08: qty 1

## 2014-10-08 MED ORDER — METHOCARBAMOL 500 MG PO TABS
500.0000 mg | ORAL_TABLET | Freq: Four times a day (QID) | ORAL | Status: DC | PRN
  Administered 2014-10-09 – 2014-10-11 (×6): 500 mg via ORAL
  Filled 2014-10-08 (×6): qty 1

## 2014-10-08 MED ORDER — FENTANYL CITRATE 0.05 MG/ML IJ SOLN
25.0000 ug | Freq: Once | INTRAMUSCULAR | Status: AC
  Administered 2014-10-08: 25 ug via INTRAMUSCULAR
  Filled 2014-10-08: qty 2

## 2014-10-08 NOTE — H&P (Signed)
[facility] History and Physical  Carolyn Henderson ATF:[id number] DOB: 11-Mar-1930 DOA: 10/08/2014  Referring physician: EDP PCP: Eulas Post, MD   Chief Complaint: Fall, R hip pain   HPI: Carolyn Henderson is a 80 y.o. female who presents to the ED after a fall on Sunday.  Since then she has had progressive worsening of R hip pain with ambulation and gait instability / discomfort.  Decreasing ability to walk during this time period due to pain.  Presents to ED.  Review of Systems: Systems reviewed.  As above, otherwise negative  Past Medical History  Diagnosis Date  . Hypertension   . Hyperlipidemia   . Thyroid disease   . Arthritis   . Complication of anesthesia   . PONV (postoperative nausea and vomiting)   . Shortness of breath     WITH EXERTION  . Osteopenia   . Precancerous skin lesion   . Diverticulosis   . GERD (gastroesophageal reflux disease)     ONLY OCCASIONAL  . Difficulty sleeping     TAKES TYLENOL PM NIGHTLY  . History of shingles    Past Surgical History  Procedure Laterality Date  . Cataract extraction    . Total hip arthroplasty  1999 / 2007    BILATERAL  . Abdominal hysterectomy    . Lumbar laminectomy    . Tonsillectomy    . Colon surgery  1983    COLON RESECTION  . Back surgery    . Joint replacement      TOTAL KNEE 2012 / BIL TOTAL HIPS   . Total hip revision Right 08/02/2014    Procedure: RIGHT TOTAL HIP REVISION;  Surgeon: Mauri Pole, MD;  Location: [facility];  Service: Orthopedics;  Laterality: Right;   Social History:  reports that she quit smoking about 22 years ago. She does not have any smokeless tobacco history on file. She reports that she does not drink alcohol or use illicit drugs.  Allergies  Allergen Reactions  . Cephalosporins     Unknown reaction   . Codeine Sulfate Nausea And Vomiting  . Hydrocodone Itching and Other (See Comments)  . Tramadol Nausea Only  . Penicillins Rash    Family History  Problem  Relation Age of Onset  . Hypertension      fhx  . Cancer      prostate/fhx     Prior to Admission medications   Medication Sig Start Date End Date Taking? Authorizing Provider  acetaminophen (TYLENOL) 325 MG tablet Take 2 tablets (650 mg total) by mouth every 6 (six) hours as needed for mild pain (or Fever >/= 101). 08/05/14  Yes Lucille Passy Babish, PA-C  amLODipine (NORVASC) 2.5 MG tablet Take 2.5 mg by mouth every evening.   Yes Historical Provider, MD  Calcium Carbonate-Vitamin D 600-400 MG-UNIT per tablet Take 1 tablet by mouth 2 (two) times daily.    Yes Historical Provider, MD  Carboxymethylcell-Hypromellose (GENTEAL OP) Apply 1 drop to eye at bedtime.   Yes Historical Provider, MD  cetirizine (ZYRTEC) 10 MG tablet Take 10 mg by mouth at bedtime.    Yes Historical Provider, MD  cholecalciferol (VITAMIN D) 1000 UNITS tablet Take 1,000 Units by mouth daily.   Yes Historical Provider, MD  clobetasol ointment (TEMOVATE) 2.70 % Apply 1 application topically 2 (two) times daily as needed (for eczema).   Yes Historical Provider, MD  diphenhydrAMINE (BENADRYL) 25 MG tablet Take 50 mg by mouth at bedtime.    Yes Historical Provider,  MD  gabapentin (NEURONTIN) 300 MG capsule Take 300 mg by mouth 2 (two) times daily.   Yes Historical Provider, MD  hydrochlorothiazide (HYDRODIURIL) 25 MG tablet Take 25 mg by mouth every morning.   Yes Historical Provider, MD  ibuprofen (ADVIL,MOTRIN) 200 MG tablet Take 200 mg by mouth every 8 (eight) hours as needed for mild pain.    Yes Historical Provider, MD  loratadine (CLARITIN) 10 MG tablet Take 10 mg by mouth daily with breakfast.    Yes Historical Provider, MD  methocarbamol (ROBAXIN) 500 MG tablet Take 500 mg by mouth every 6 (six) hours as needed for muscle spasms.  09/23/14  Yes Historical Provider, MD  Multiple Vitamins-Minerals (PRESERVISION AREDS 2) CAPS Take 1 capsule by mouth 2 (two) times daily.    Yes Historical Provider, MD  Polyethyl  Glycol-Propyl Glycol (SYSTANE OP) Apply 1 drop to eye 3 (three) times daily as needed (for dry eyes).    Yes Historical Provider, MD  SYNTHROID 125 MCG tablet Take 125 mcg by mouth daily before breakfast.  09/26/14  Yes Historical Provider, MD  tacrolimus (PROTOPIC) 0.1 % ointment Apply 0.1 % topically daily as needed (for itchy eyelids).    Yes Historical Provider, MD  vitamin B-12 (CYANOCOBALAMIN) 1000 MCG tablet Take 1,000 mcg by mouth daily.   Yes Historical Provider, MD   Physical Exam: Filed Vitals:   10/08/14 2155  BP: 128/61  Pulse: 66  Temp: 98.5 F (36.9 C)  Resp: 18    BP 128/61 mmHg  Pulse 66  Temp(Src) 98.5 F (36.9 C) (Oral)  Resp 18  SpO2 96%  General Appearance:    Alert, oriented, no distress, appears stated age  Head:    Normocephalic, atraumatic  Eyes:    PERRL, EOMI, sclera non-icteric        Nose:   Nares without drainage or epistaxis. Mucosa, turbinates normal  Throat:   Moist mucous membranes. Oropharynx without erythema or exudate.  Neck:   Supple. No carotid bruits.  No thyromegaly.  No lymphadenopathy.   Back:     No CVA tenderness, no spinal tenderness  Lungs:     Clear to auscultation bilaterally, without wheezes, rhonchi or rales  Chest wall:    No tenderness to palpitation  Heart:    Regular rate and rhythm without murmurs, gallops, rubs  Abdomen:     Soft, non-tender, nondistended, normal bowel sounds, no organomegaly  Genitalia:    deferred  Rectal:    deferred  Extremities:   Bruising and hematoma about right buttock.  Pulses:   2+ and symmetric all extremities  Skin:   Skin color, texture, turgor normal, no rashes or lesions  Lymph nodes:   Cervical, supraclavicular, and axillary nodes normal  Neurologic:   CNII-XII intact. Normal strength, sensation and reflexes      throughout    Labs on Admission:  Basic Metabolic Panel: No results for input(s): NA, K, CL, CO2, GLUCOSE, BUN, CREATININE, CALCIUM, MG, PHOS in the last 168 hours. Liver  Function Tests: No results for input(s): AST, ALT, ALKPHOS, BILITOT, PROT, ALBUMIN in the last 168 hours. No results for input(s): LIPASE, AMYLASE in the last 168 hours. No results for input(s): AMMONIA in the last 168 hours. CBC: No results for input(s): WBC, NEUTROABS, HGB, HCT, MCV, PLT in the last 168 hours. Cardiac Enzymes: No results for input(s): CKTOTAL, CKMB, CKMBINDEX, TROPONINI in the last 168 hours.  BNP (last 3 results) No results for input(s): PROBNP in the last 8760  hours. CBG: No results for input(s): GLUCAP in the last 168 hours.  Radiological Exams on Admission: Dg Lumbar Spine Complete  10/08/2014   CLINICAL DATA:  Fall fall days ago. Low back pain radiating to right hip.  EXAM: LUMBAR SPINE - COMPLETE 4+ VIEW  COMPARISON:  None.  FINDINGS: No significant joint space narrowing at L3-L4, L4-L5, L5-S1. No acute loss vertebral body height. No subluxation. No pars fracture evident.  IMPRESSION: 1. No acute findings lumbar spine by radiography. 2. Severe disc osteophytic disease from L3 through S1   Electronically Signed   By: Suzy Bouchard M.D.   On: 10/08/2014 20:19   Dg Hip Complete Right  10/08/2014   CLINICAL DATA:  Fall 5 days ago with right hip pain. Right hip revision surgery 08/02/2014.  EXAM: RIGHT HIP - COMPLETE 2+ VIEW  COMPARISON:  08/02/2014  FINDINGS: Left total hip arthroplasty unchanged. Right total hip arthroplasty is intact and normally located. No evidence of acute fracture or dislocation. Moderate degenerate changes of the spine and symphysis pubis joint.  IMPRESSION: No acute findings.  Bilateral total hip arthroplasties unchanged.   Electronically Signed   By: Marin Olp M.D.   On: 10/08/2014 20:18   Ct Pelvis Wo Contrast  10/08/2014   CLINICAL DATA:  Acute onset of right-sided hip pain status post fall, with soft tissue swelling. Initial encounter.  EXAM: CT PELVIS WITHOUT CONTRAST  TECHNIQUE: Multidetector CT imaging of the pelvis was performed  following the standard protocol without intravenous contrast.  COMPARISON:  Right hip radiographs performed earlier today at 7:41 p.m.  FINDINGS: There is an acute slightly comminuted fracture extending through the lateral left sacral ala, into the left sacroiliac joint. There is an associated minimally displaced fracture through the left transverse processes of L5.  In addition, there is a poorly characterized minimally displaced fracture line extending across the right ischium, to the acetabular component of the patient's right hip prosthesis. There appears to be a small amount of overlying heterotopic bone formation, suggesting that this is a subacute or chronic fracture. No definite pubic rami fractures are seen.  The patient's bilateral hip prostheses otherwise appear intact. There is no evidence of loosening. Degenerative change is noted at the pubic symphysis. Vacuum phenomenon and facet disease are noted at the lower lumbar spine, with multilevel disc space narrowing.  Scattered calcification is seen along the abdominal aorta and its branches. Visualized small and large bowel loops are grossly unremarkable. The bladder is mildly distended and grossly unremarkable, though difficult to fully assess due to metal artifact.  Note is made of an incompletely characterized soft tissue hematoma overlying the right greater femoral trochanter, measuring 6.0 cm in size.  IMPRESSION: 1. Acute slightly comminuted fracture extending through the lateral left sacral ala, into the left sacroiliac joint. Associated minimally displaced fracture through the left transverse process of L5. 2. Poorly characterized minimally displaced fracture line extending across the right ischium, to the acetabular component of the patient's right hip prosthesis. There is a small amount of overlying heterotopic bone formation, suggesting that this is subacute or chronic in nature. Would correlate for history of prior falls. 3. No additional  fracture seen. 4. Soft tissue hematoma noted overlying the right greater femoral trochanter, measuring 6.0 cm in size.   Electronically Signed   By: Garald Balding M.D.   On: 10/08/2014 22:46    EKG: Independently reviewed.  Assessment/Plan Principal Problem:   Sacral fracture, closed   1. Closed sacral fracture - 1.  PT/OT 2. Pain control with norco 3. Robaxin for muscle spasms    Code Status: Full  Family Communication: Family at bedside Disposition Plan: Admit to inpatient   Time spent: 50 min  GARDNER, JARED M. [facility] Pager [phone]  If 7AM-7PM, please contact the day team taking care of the patient [email or web address] Password [facility] 10/08/2014, 11:28 PM

## 2014-10-08 NOTE — ED Provider Notes (Signed)
CSN: [id number]     Arrival date & time 10/08/14  1816 History     Chief Complaint  Patient presents with  . Leg Pain   The history is provided by the patient. No language interpreter was used.     HPI Comments: Carolyn Henderson is a 80 y.o. female who presents to the Emergency Department complaining of pain in the right hip, low back and generalized progression of gait instability/discomfort. Patient has history of sciatica on the left side, and had a mechanical fall approximately 1 week ago.  Patient saw her physician that day.  Since the fall she has had increasing pain, with decreasing ability to walk him a largely secondary to pain in the lower extremities. No head trauma, loss of consciousness, new loss of sensation or weakness in any extremity. Minimal relief with ibuprofen and Tylenol. Patient has history of multiple orthopedic procedures in each lower extremity, including hip repair both sides.    Past Medical History  Diagnosis Date  . Hypertension   . Hyperlipidemia   . Thyroid disease   . Arthritis   . Complication of anesthesia   . PONV (postoperative nausea and vomiting)   . Shortness of breath     WITH EXERTION  . Osteopenia   . Precancerous skin lesion   . Diverticulosis   . GERD (gastroesophageal reflux disease)     ONLY OCCASIONAL  . Difficulty sleeping     TAKES TYLENOL PM NIGHTLY  . History of shingles    Past Surgical History  Procedure Laterality Date  . Cataract extraction    . Total hip arthroplasty  1999 / 2007    BILATERAL  . Abdominal hysterectomy    . Lumbar laminectomy    . Tonsillectomy    . Colon surgery  1983    COLON RESECTION  . Back surgery    . Joint replacement      TOTAL KNEE 2012 / BIL TOTAL HIPS   . Total hip revision Right 08/02/2014    Procedure: RIGHT TOTAL HIP REVISION;  Surgeon: Mauri Pole, MD;  Location: [facility];  Service: Orthopedics;  Laterality: Right;   Family History  Problem Relation Age of Onset  .  Hypertension      fhx  . Cancer      prostate/fhx   History  Substance Use Topics  . Smoking status: Former Smoker    Quit date: 07/21/1992  . Smokeless tobacco: Not on file  . Alcohol Use: No   OB History    No data available     Review of Systems  Constitutional:       Per HPI, otherwise negative  HENT:       Per HPI, otherwise negative  Respiratory:       Per HPI, otherwise negative  Cardiovascular:       Per HPI, otherwise negative  Gastrointestinal: Negative for vomiting.  Endocrine:       Negative aside from HPI  Genitourinary:       Neg aside from HPI   Musculoskeletal:       Per HPI, otherwise negative  Skin: Positive for color change.       Hematoma on the right posterior hip  Neurological: Negative for syncope.  All other systems reviewed and are negative.     Allergies  Cephalosporins; Codeine sulfate; Hydrocodone; Tramadol; and Penicillins  Home Medications   Prior to Admission medications   Medication Sig Start Date End Date Taking? Authorizing Provider  acetaminophen (TYLENOL) 325 MG tablet Take 2 tablets (650 mg total) by mouth every 6 (six) hours as needed for mild pain (or Fever >/= 101). 08/05/14  Yes Lucille Passy Babish, PA-C  amLODipine (NORVASC) 2.5 MG tablet Take 2.5 mg by mouth every evening.   Yes Historical Provider, MD  Calcium Carbonate-Vitamin D 600-400 MG-UNIT per tablet Take 1 tablet by mouth 2 (two) times daily.    Yes Historical Provider, MD  Carboxymethylcell-Hypromellose (GENTEAL OP) Apply 1 drop to eye at bedtime.   Yes Historical Provider, MD  cetirizine (ZYRTEC) 10 MG tablet Take 10 mg by mouth at bedtime.    Yes Historical Provider, MD  cholecalciferol (VITAMIN D) 1000 UNITS tablet Take 1,000 Units by mouth daily.   Yes Historical Provider, MD  clobetasol ointment (TEMOVATE) 6.64 % Apply 1 application topically 2 (two) times daily as needed (for eczema).   Yes Historical Provider, MD  diphenhydrAMINE (BENADRYL) 25 MG tablet  Take 50 mg by mouth at bedtime.    Yes Historical Provider, MD  gabapentin (NEURONTIN) 300 MG capsule Take 300 mg by mouth 2 (two) times daily.   Yes Historical Provider, MD  hydrochlorothiazide (HYDRODIURIL) 25 MG tablet Take 25 mg by mouth every morning.   Yes Historical Provider, MD  ibuprofen (ADVIL,MOTRIN) 200 MG tablet Take 200 mg by mouth every 8 (eight) hours as needed for mild pain.    Yes Historical Provider, MD  loratadine (CLARITIN) 10 MG tablet Take 10 mg by mouth daily with breakfast.    Yes Historical Provider, MD  methocarbamol (ROBAXIN) 500 MG tablet Take 500 mg by mouth every 6 (six) hours as needed for muscle spasms.  09/23/14  Yes Historical Provider, MD  Multiple Vitamins-Minerals (PRESERVISION AREDS 2) CAPS Take 1 capsule by mouth 2 (two) times daily.    Yes Historical Provider, MD  Polyethyl Glycol-Propyl Glycol (SYSTANE OP) Apply 1 drop to eye 3 (three) times daily as needed (for dry eyes).    Yes Historical Provider, MD  SYNTHROID 125 MCG tablet Take 125 mcg by mouth daily before breakfast.  09/26/14  Yes Historical Provider, MD  tacrolimus (PROTOPIC) 0.1 % ointment Apply 0.1 % topically daily as needed (for itchy eyelids).    Yes Historical Provider, MD  vitamin B-12 (CYANOCOBALAMIN) 1000 MCG tablet Take 1,000 mcg by mouth daily.   Yes Historical Provider, MD   BP 134/61 mmHg  Pulse 70  Temp(Src) 98.5 F (36.9 C) (Oral)  Resp 18  SpO2 98% Physical Exam  Constitutional: She is oriented to person, place, and time. She appears well-developed and well-nourished. No distress.  HENT:  Head: Normocephalic and atraumatic.  Eyes: Conjunctivae and EOM are normal.  Cardiovascular: Normal rate and regular rhythm.   Pulmonary/Chest: Effort normal and breath sounds normal. No stridor. No respiratory distress.  Abdominal: She exhibits no distension. There is no tenderness.  Musculoskeletal: She exhibits no edema.  Neurological: She is alert and oriented to person, place, and  time. No cranial nerve deficit.  Skin: Skin is warm and dry.  Psychiatric: She has a normal mood and affect.  Nursing note and vitals reviewed.   ED Course  Procedures   DIAGNOSTIC STUDIES: Oxygen Saturation is 96% on RA, adequate by my interpretation.    COORDINATION OF CARE: 12:16 AM Discussed treatment plan with pt at bedside and pt agreed to plan.   Labs Review Labs Reviewed - No data to display  Imaging Review Dg Lumbar Spine Complete  10/08/2014   CLINICAL DATA:  Fall  fall days ago. Low back pain radiating to right hip.  EXAM: LUMBAR SPINE - COMPLETE 4+ VIEW  COMPARISON:  None.  FINDINGS: No significant joint space narrowing at L3-L4, L4-L5, L5-S1. No acute loss vertebral body height. No subluxation. No pars fracture evident.  IMPRESSION: 1. No acute findings lumbar spine by radiography. 2. Severe disc osteophytic disease from L3 through S1   Electronically Signed   By: Suzy Bouchard M.D.   On: 10/08/2014 20:19   Dg Hip Complete Right  10/08/2014   CLINICAL DATA:  Fall 5 days ago with right hip pain. Right hip revision surgery 08/02/2014.  EXAM: RIGHT HIP - COMPLETE 2+ VIEW  COMPARISON:  08/02/2014  FINDINGS: Left total hip arthroplasty unchanged. Right total hip arthroplasty is intact and normally located. No evidence of acute fracture or dislocation. Moderate degenerate changes of the spine and symphysis pubis joint.  IMPRESSION: No acute findings.  Bilateral total hip arthroplasties unchanged.   Electronically Signed   By: Marin Olp M.D.   On: 10/08/2014 20:18   Ct Pelvis Wo Contrast  10/08/2014   CLINICAL DATA:  Acute onset of right-sided hip pain status post fall, with soft tissue swelling. Initial encounter.  EXAM: CT PELVIS WITHOUT CONTRAST  TECHNIQUE: Multidetector CT imaging of the pelvis was performed following the standard protocol without intravenous contrast.  COMPARISON:  Right hip radiographs performed earlier today at 7:41 p.m.  FINDINGS: There is an acute  slightly comminuted fracture extending through the lateral left sacral ala, into the left sacroiliac joint. There is an associated minimally displaced fracture through the left transverse processes of L5.  In addition, there is a poorly characterized minimally displaced fracture line extending across the right ischium, to the acetabular component of the patient's right hip prosthesis. There appears to be a small amount of overlying heterotopic bone formation, suggesting that this is a subacute or chronic fracture. No definite pubic rami fractures are seen.  The patient's bilateral hip prostheses otherwise appear intact. There is no evidence of loosening. Degenerative change is noted at the pubic symphysis. Vacuum phenomenon and facet disease are noted at the lower lumbar spine, with multilevel disc space narrowing.  Scattered calcification is seen along the abdominal aorta and its branches. Visualized small and large bowel loops are grossly unremarkable. The bladder is mildly distended and grossly unremarkable, though difficult to fully assess due to metal artifact.  Note is made of an incompletely characterized soft tissue hematoma overlying the right greater femoral trochanter, measuring 6.0 cm in size.  IMPRESSION: 1. Acute slightly comminuted fracture extending through the lateral left sacral ala, into the left sacroiliac joint. Associated minimally displaced fracture through the left transverse process of L5. 2. Poorly characterized minimally displaced fracture line extending across the right ischium, to the acetabular component of the patient's right hip prosthesis. There is a small amount of overlying heterotopic bone formation, suggesting that this is subacute or chronic in nature. Would correlate for history of prior falls. 3. No additional fracture seen. 4. Soft tissue hematoma noted overlying the right greater femoral trochanter, measuring 6.0 cm in size.   Electronically Signed   By: Garald Balding M.D.    On: 10/08/2014 22:46    On repeat exam the patient continues to complain of pain in the right hip. CT scan will be ordered.  Update: On repeat exam the patient is aware now of findings, including multiple fractures of the pelvis, sacrum. MDM   Final diagnoses:  Fall   multiple fractures  of the pelvis.  This patient presents several days after a fall, with ongoing pain, and decreased ambulatory capacity. Patient is distally neurovascularly intact, but cannot move right hip without substantial pain. CT demonstrates multiple fractures, pelvic, sacral. Patient was admitted for further evaluation and management.  Carmin Muskrat, MD 10/09/14 [phone]

## 2014-10-08 NOTE — ED Notes (Signed)
Pt had fall on Monday, was checked out by PCP on Monday. Pt reports R hip pain has gotten progressively worse and the L leg has numbness radiating to foot. Worsening symptoms began yesterday. Pt ambulatory with walker.

## 2014-10-08 NOTE — ED Notes (Signed)
Pt reports having muscle spasms. Dr. Vanita Panda made aware.

## 2014-10-09 ENCOUNTER — Encounter (HOSPITAL_COMMUNITY): Payer: Self-pay | Admitting: *Deleted

## 2014-10-09 MED ORDER — OCUVITE-LUTEIN PO CAPS
1.0000 | ORAL_CAPSULE | Freq: Two times a day (BID) | ORAL | Status: DC
  Administered 2014-10-09 – 2014-10-11 (×5): 1 via ORAL
  Filled 2014-10-09 (×6): qty 1

## 2014-10-09 MED ORDER — POLYVINYL ALCOHOL 1.4 % OP SOLN
1.0000 [drp] | Freq: Three times a day (TID) | OPHTHALMIC | Status: DC | PRN
  Filled 2014-10-09: qty 15

## 2014-10-09 NOTE — Progress Notes (Signed)
[facility] PROGRESS NOTE  Carolyn Henderson WVP:[id number] DOB: [birth year] DOA: 10/08/2014 PCP: Eulas Post, MD  Assessment/Plan: 1. Generalized weakness and a mechanical fall a week ago: Persistent pain in the right hip brought her to the hospital and she was found to have a sacral fracture.  Pain control and physical therapy.    Code Status: full code.  Family Communication: none at bedside Disposition Plan: pending.    Consultants:  Physical therapy  Procedures:  CT pelvis.   Antibiotics:  none  HPI/Subjective: Pain is much better controlled/   Objective: Filed Vitals:   10/09/14 1405  BP: 111/61  Pulse: 65  Temp: 97.8 F (36.6 C)  Resp: 16    Intake/Output Summary (Last 24 hours) at 10/09/14 1619 Last data filed at 10/09/14 1251  Gross per 24 hour  Intake    480 ml  Output   1700 ml  Net  -1220 ml   There were no vitals filed for this visit.  Exam:   General:  Alert afebrile comfortable  Cardiovascular: s1s2  Respiratory: ctab  Abdomen: soft non tender non distended bowel sounds heard  Musculoskeletal: no pedal edmea.   Data Reviewed: Basic Metabolic Panel: No results for input(s): NA, K, CL, CO2, GLUCOSE, BUN, CREATININE, CALCIUM, MG, PHOS in the last 168 hours. Liver Function Tests: No results for input(s): AST, ALT, ALKPHOS, BILITOT, PROT, ALBUMIN in the last 168 hours. No results for input(s): LIPASE, AMYLASE in the last 168 hours. No results for input(s): AMMONIA in the last 168 hours. CBC: No results for input(s): WBC, NEUTROABS, HGB, HCT, MCV, PLT in the last 168 hours. Cardiac Enzymes: No results for input(s): CKTOTAL, CKMB, CKMBINDEX, TROPONINI in the last 168 hours. BNP (last 3 results) No results for input(s): PROBNP in the last 8760 hours. CBG: No results for input(s): GLUCAP in the last 168 hours.  No results found for this or any previous visit (from the past 240 hour(s)).   Studies: Dg Lumbar Spine  Complete  10/08/2014   CLINICAL DATA:  Fall fall days ago. Low back pain radiating to right hip.  EXAM: LUMBAR SPINE - COMPLETE 4+ VIEW  COMPARISON:  None.  FINDINGS: No significant joint space narrowing at L3-L4, L4-L5, L5-S1. No acute loss vertebral body height. No subluxation. No pars fracture evident.  IMPRESSION: 1. No acute findings lumbar spine by radiography. 2. Severe disc osteophytic disease from L3 through S1   Electronically Signed   By: Suzy Bouchard M.D.   On: 10/08/2014 20:19   Dg Hip Complete Right  10/08/2014   CLINICAL DATA:  Fall 5 days ago with right hip pain. Right hip revision surgery 08/02/2014.  EXAM: RIGHT HIP - COMPLETE 2+ VIEW  COMPARISON:  08/02/2014  FINDINGS: Left total hip arthroplasty unchanged. Right total hip arthroplasty is intact and normally located. No evidence of acute fracture or dislocation. Moderate degenerate changes of the spine and symphysis pubis joint.  IMPRESSION: No acute findings.  Bilateral total hip arthroplasties unchanged.   Electronically Signed   By: Marin Olp M.D.   On: 10/08/2014 20:18   Ct Pelvis Wo Contrast  10/08/2014   CLINICAL DATA:  Acute onset of right-sided hip pain status post fall, with soft tissue swelling. Initial encounter.  EXAM: CT PELVIS WITHOUT CONTRAST  TECHNIQUE: Multidetector CT imaging of the pelvis was performed following the standard protocol without intravenous contrast.  COMPARISON:  Right hip radiographs performed earlier today at 7:41 p.m.  FINDINGS: There is an acute slightly comminuted  fracture extending through the lateral left sacral ala, into the left sacroiliac joint. There is an associated minimally displaced fracture through the left transverse processes of L5.  In addition, there is a poorly characterized minimally displaced fracture line extending across the right ischium, to the acetabular component of the patient's right hip prosthesis. There appears to be a small amount of overlying heterotopic bone  formation, suggesting that this is a subacute or chronic fracture. No definite pubic rami fractures are seen.  The patient's bilateral hip prostheses otherwise appear intact. There is no evidence of loosening. Degenerative change is noted at the pubic symphysis. Vacuum phenomenon and facet disease are noted at the lower lumbar spine, with multilevel disc space narrowing.  Scattered calcification is seen along the abdominal aorta and its branches. Visualized small and large bowel loops are grossly unremarkable. The bladder is mildly distended and grossly unremarkable, though difficult to fully assess due to metal artifact.  Note is made of an incompletely characterized soft tissue hematoma overlying the right greater femoral trochanter, measuring 6.0 cm in size.  IMPRESSION: 1. Acute slightly comminuted fracture extending through the lateral left sacral ala, into the left sacroiliac joint. Associated minimally displaced fracture through the left transverse process of L5. 2. Poorly characterized minimally displaced fracture line extending across the right ischium, to the acetabular component of the patient's right hip prosthesis. There is a small amount of overlying heterotopic bone formation, suggesting that this is subacute or chronic in nature. Would correlate for history of prior falls. 3. No additional fracture seen. 4. Soft tissue hematoma noted overlying the right greater femoral trochanter, measuring 6.0 cm in size.   Electronically Signed   By: Garald Balding M.D.   On: 10/08/2014 22:46    Scheduled Meds: . amLODipine  2.5 mg Oral QPM  . calcium-vitamin D  1 tablet Oral BID  . cholecalciferol  1,000 Units Oral Daily  . gabapentin  300 mg Oral BID  . hydrochlorothiazide  25 mg Oral q morning - 10a  . levothyroxine  125 mcg Oral QAC breakfast  . loratadine  10 mg Oral Q breakfast  . multivitamin-lutein  1 capsule Oral BID  . vitamin B-12  1,000 mcg Oral Daily   Continuous Infusions:   Principal  Problem:   Sacral fracture, closed    Time spent: 25 minutes/     [facility]  [facility] Pager [phone]. If 7PM-7AM, please contact night-coverage at [email or web address], password [facility] 10/09/2014, 4:19 PM  LOS: 1 day

## 2014-10-09 NOTE — Progress Notes (Signed)
Pt, her spouse, and daughter Carolyn Henderson), would like to have pt sent to [facility] after leaving hospital for rehab. Pt and family state that pt has been there before for rehab, and they want the pt to have rehab again before going home. Pt states that she is "too much for her husband to care for at home alone".

## 2014-10-09 NOTE — Evaluation (Signed)
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  At baseline, she is mod I ambulating to bathroom and husband assists as needed for LB adls.  She will benefit from skilled OT to increase safety and activity tolerance concentrating on toileting.  Pt would like to return home if she can manage but she is open to rehab if she needs to go there.    Follow Up Recommendations  SNF;Home health OT (vs depending upon activity tolerance)    Equipment Recommendations   (pt can borrow 3:1 commode if needed)    Recommendations for Other Services       Precautions / Restrictions Precautions Precautions: Fall Restrictions Weight Bearing Restrictions: No      Mobility Bed Mobility Overal bed mobility: Needs Assistance Bed Mobility: Supine to Sit;Sit to Supine      Sit to supine: Mod assist   General bed mobility comments: Assist for LEs  Transfers Overall transfer level: Needs assistance Equipment used: Rolling walker (2 wheeled) Transfers: Sit to/from Stand Sit to Stand: Min assist         General transfer comment: Min A to rise and steady.  Cues to extend RLE for comfort     Balance                                            ADL Overall ADL's : Needs assistance/impaired     Grooming: Oral care;Sitting;Set up   Upper Body Bathing: Set up;Sitting   Lower Body Bathing: Moderate assistance;Sit to/from stand   Upper Body Dressing : Set up;Sitting   Lower Body Dressing: Maximal assistance;Sit to/from stand   Toilet Transfer: Minimal assistance;BSC;Stand-pivot   Toileting- Clothing Manipulation and Hygiene: Set up;Sitting/lateral lean         General ADL Comments: pt used commode and walked 3 feet back to bed with cues to take weight through UEs for comfort.  Pt has used AE in the past, but husband has been assisting her as it was easier. Pt was walking to bathroom to use comfort height commodes, day and night. She can borrow a 3:1 commode if needed     Vision                     Perception     Praxis      Pertinent Vitals/Pain Pain Assessment: 0-10 Pain Score: 4 with weight bearing Pain Location: R hip Pain Descriptors / Indicators: Aching Pain Intervention(s): Limited activity within patient's tolerance;Monitored during session;Repositioned     Hand Dominance     Extremity/Trunk Assessment Upper Extremity Assessment Upper Extremity Assessment: Overall WFL for tasks assessed          Communication Communication Communication: No difficulties   Cognition Arousal/Alertness: Awake/alert Behavior During Therapy: WFL for tasks assessed/performed Overall Cognitive Status: Within Functional Limits for tasks assessed                     General Comments       Exercises       Shoulder Instructions  Home Living Family/patient expects to be discharged to:: Private residence Living Arrangements: Spouse/significant other Available Help at Discharge: Family Type of Home: House Home Access: Stairs to enter Technical brewer of Steps: 3 Entrance Stairs-Rails: Right Home Layout: One level     Bathroom Shower/Tub: Teacher, early years/pre: Handicapped height     Home Equipment: Tub bench   Additional Comments: pt does not have 3:1 but can borrow one      Prior Functioning/Environment Level of Independence: Needs assistance        Comments: husband helped with LB adls    OT Diagnosis: Generalized weakness;Acute pain   OT Problem List: Decreased strength;Decreased activity tolerance;Pain   OT Treatment/Interventions: Self-care/ADL training;DME and/or AE  instruction;Patient/family education;Therapeutic activities    OT Goals(Current goals can be found in the care plan section) Acute Rehab OT Goals Patient Stated Goal: I want to get better and stay better OT Goal Formulation: With patient Time For Goal Achievement: 10/16/14 Potential to Achieve Goals: Good ADL Goals Pt Will Transfer to Toilet: with min guard assist;ambulating;bedside commode (vs comfort height commode) Pt Will Perform Toileting - Clothing Manipulation and hygiene: with supervision;sit to/from stand  OT Frequency: Min 2X/week   Barriers to D/C:            Co-evaluation              End of Session    Activity Tolerance: Patient limited by fatigue Patient left: in bed;with call bell/phone within reach   Time: 1220-1247 OT Time Calculation (min): 27 min Charges:  OT General Charges $OT Visit: 1 Procedure OT Evaluation $Initial OT Evaluation Tier I: 1 Procedure OT Treatments $Self Care/Home Management : 8-22 mins G-Codes:    Shuan Statzer 16-Oct-2014, 1:07 PM  Lesle Chris, OTR/L [phone] 10-16-14

## 2014-10-09 NOTE — Evaluation (Signed)
Physical Therapy Evaluation Patient Details Name: Carolyn Henderson MRN: [id number] DOB: 09/25/1930 Today's Date: 10/09/2014   History of Present Illness  recent fall at home with sacral (ishial fracture) with recent L hip burcitis , and hx of B hip replacments and L knee replacment  Clinical Impression  Pt with new sacral fracture limiting her mobility. To benefit from PT to help safe discharge home or SNF depending on progress here.    Follow Up Recommendations SNF (versus HH, may progress and be able to gom home itf able to weight bear little more each day. )    Equipment Recommendations  None recommended by PT    Recommendations for Other Services       Precautions / Restrictions Precautions Precautions: Fall Restrictions Weight Bearing Restrictions: No (Dr. Karleen Hampshire reproted pt is WBAT)      Mobility  Bed Mobility Overal bed mobility: Needs Assistance Bed Mobility: Supine to Sit;Sit to Supine     Supine to sit: Min assist        Transfers Overall transfer level: Needs assistance Equipment used: Rolling walker (2 wheeled) Transfers: Sit to/from Stand Sit to Stand: Min assist         General transfer comment: RW and guidance for safety with RW and pivotng , completing turn to have safe decent  Ambulation/Gait Ambulation/Gait assistance: Min assist Ambulation Distance (Feet): 5 Feet (twice with truning to [facility] and then to recliner) Assistive device: Rolling walker (2 wheeled) Gait Pattern/deviations: Step-to pattern Gait velocity: slow guarded with inabilityt iow full WB on RLE due to pain at this time      Stairs            Wheelchair Mobility    Modified Rankin (Stroke Patients Only)       Balance                                             Pertinent Vitals/Pain Pain Assessment: 0-10 Pain Score: 2  Pain Location: very tolerable at the moment, "they have gotten it calmed down , but it was really bad last 2 nights). Lying  still supine very little pain, but pain increased with attempting to WB through R LE with ambulation, therefore very limited this morning with steps and used UEs to deweight LE with ambulation.  Pain Descriptors / Indicators: Aching Pain Intervention(s): Limited activity within patient's tolerance;Monitored during session    Hiram expects to be discharged to:: Private residence Living Arrangements: Spouse/significant other Available Help at Discharge: Family Type of Home: House Home Access: Stairs to enter Entrance Stairs-Rails: Right Entrance Stairs-Number of Steps: 3 Home Layout: One level Home Equipment: Environmental consultant - 2 wheels;Walker - 4 wheels;Shower seat;Bedside commode;Wheelchair - manual      Prior Function Level of Independence: Independent with assistive device(s)         Comments: she was recently using a RW due to rehab on R hip replacment, and recent burcitis on L hip , and then recently D/C from SNF rehab to home (all of this since October)      Hand Dominance        Extremity/Trunk Assessment               Lower Extremity Assessment: Overall WFL for tasks assessed         Communication   Communication: No difficulties  Cognition Arousal/Alertness: Awake/alert  Behavior During Therapy: WFL for tasks assessed/performed Overall Cognitive Status: Within Functional Limits for tasks assessed                      General Comments      Exercises        Assessment/Plan    PT Assessment Patient needs continued PT services  PT Diagnosis Difficulty walking   PT Problem List Decreased strength;Decreased activity tolerance;Decreased mobility;Decreased safety awareness;Decreased knowledge of use of DME  PT Treatment Interventions DME instruction;Gait training;Stair training;Functional mobility training;Therapeutic activities;Therapeutic exercise;Patient/family education   PT Goals (Current goals can be found in the Care Plan  section) Acute Rehab PT Goals Patient Stated Goal: I want to get better and stay better PT Goal Formulation: With patient Time For Goal Achievement: 10/23/14 Potential to Achieve Goals: Good    Frequency Min 4X/week   Barriers to discharge        Co-evaluation               End of Session   Activity Tolerance: Patient tolerated treatment well Patient left: in chair;with family/visitor present Nurse Communication: Mobility status         Time: 5638-7564 PT Time Calculation (min) (ACUTE ONLY): 37 min   Charges:   PT Evaluation $Initial PT Evaluation Tier I: 1 Procedure PT Treatments $Therapeutic Activity: 8-22 mins   PT G CodesClide Dales 10/12/14, 11:05 AM  Clide Dales, PT Pager: [phone] 10/12/2014

## 2014-10-10 DIAGNOSIS — E871 Hypo-osmolality and hyponatremia: Secondary | ICD-10-CM

## 2014-10-10 HISTORY — DX: Hypo-osmolality and hyponatremia: E87.1

## 2014-10-10 LAB — CBC
HCT: 35.4 % — ABNORMAL LOW (ref 36.0–46.0)
HEMOGLOBIN: 11.3 g/dL — AB (ref 12.0–15.0)
MCH: 29 pg (ref 26.0–34.0)
MCHC: 31.9 g/dL (ref 30.0–36.0)
MCV: 90.8 fL (ref 78.0–100.0)
Platelets: 267 10*3/uL (ref 150–400)
RBC: 3.9 MIL/uL (ref 3.87–5.11)
RDW: 13.8 % (ref 11.5–15.5)
WBC: 5.7 10*3/uL (ref 4.0–10.5)

## 2014-10-10 LAB — BASIC METABOLIC PANEL
Anion gap: 4 — ABNORMAL LOW (ref 5–15)
BUN: 19 mg/dL (ref 6–23)
CHLORIDE: 97 meq/L (ref 96–112)
CO2: 27 mmol/L (ref 19–32)
CREATININE: 0.41 mg/dL — AB (ref 0.50–1.10)
Calcium: 8.8 mg/dL (ref 8.4–10.5)
GFR calc Af Amer: >90 mL/min (ref >90)
GFR calc non Af Amer: >90 mL/min (ref >90)
GLUCOSE: 96 mg/dL (ref 70–99)
POTASSIUM: 4 mmol/L (ref 3.5–5.1)
Sodium: 128 mmol/L — ABNORMAL LOW (ref 135–145)

## 2014-10-10 MED ORDER — SODIUM CHLORIDE 0.9 % IV SOLN
INTRAVENOUS | Status: DC
  Administered 2014-10-10: 14:00:00 via INTRAVENOUS

## 2014-10-10 MED ORDER — DIPHENHYDRAMINE HCL 25 MG PO CAPS
25.0000 mg | ORAL_CAPSULE | Freq: Once | ORAL | Status: AC
  Administered 2014-10-10: 25 mg via ORAL
  Filled 2014-10-10: qty 1

## 2014-10-10 MED ORDER — ONDANSETRON HCL 4 MG PO TABS
4.0000 mg | ORAL_TABLET | Freq: Three times a day (TID) | ORAL | Status: DC | PRN
  Administered 2014-10-10: 4 mg via ORAL
  Filled 2014-10-10: qty 1

## 2014-10-10 NOTE — Progress Notes (Signed)
Physical Therapy Treatment Patient Details Name: Carolyn Henderson MRN: [id number] DOB: 04-Dec-1929 Today's Date: November 04, 2014    History of Present Illness recent fall at home with sacral (ishial fracture) with recent L hip burcitis , and hx of B hip replacments and L knee replacment    PT Comments    Pt slightly better today, but still with difficulty with weigh bearing on RLE for ambulation and mobility.   Follow Up Recommendations  SNF     Equipment Recommendations  None recommended by PT    Recommendations for Other Services       Precautions / Restrictions Precautions Precautions: Fall Restrictions Weight Bearing Restrictions: No    Mobility  Bed Mobility Overal bed mobility: Needs Assistance Bed Mobility: Supine to Sit;Sit to Supine     Supine to sit: Min assist Sit to supine: Min assist   General bed mobility comments: Assist for LEs, she still has difficulty moving the RLE   Transfers Overall transfer level: Needs assistance Equipment used: Rolling walker (2 wheeled) Transfers: Sit to/from Stand Sit to Stand: Min assist         General transfer comment: Min A to rise and steady.  Cues to extend RLE for comfort   Ambulation/Gait Ambulation/Gait assistance: Min assist Ambulation Distance (Feet): 10 Feet Assistive device: Rolling walker (2 wheeled) Gait Pattern/deviations: Step-to pattern Gait velocity: slow guarded with inabilityt iow full WB on RLE due to pain at this time   General Gait Details: still guarded and difficlty with weight bearing on RLE for step progresssion   Stairs            Wheelchair Mobility    Modified Rankin (Stroke Patients Only)       Balance                                    Cognition Arousal/Alertness: Awake/alert Behavior During Therapy: WFL for tasks assessed/performed                        Exercises      General Comments        Pertinent Vitals/Pain Pain Assessment:  0-10 (but increaed with movment at times and weigh bearing) Pain Score: 3  Pain Location: r hip posterioly Pain Descriptors / Indicators: Aching Pain Intervention(s): Monitored during session    Home Living                      Prior Function            PT Goals (current goals can now be found in the care plan section) Acute Rehab PT Goals PT Goal Formulation: With patient Time For Goal Achievement: 10/23/14 Potential to Achieve Goals: Good Progress towards PT goals: Progressing toward goals    Frequency  Min 4X/week    PT Plan Current plan remains appropriate    Co-evaluation             End of Session Equipment Utilized During Treatment: Gait belt Activity Tolerance: Patient tolerated treatment well Patient left: in chair;with family/visitor present     Time: 0930-1000 PT Time Calculation (min) (ACUTE ONLY): 30 min  Charges:  $Gait Training: 8-22 mins $Therapeutic Activity: 8-22 mins                    G Codes:      Carolyn Henderson November 04, 2014, 1:37  PM Clide Dales, PT Pager: [phone] 10/10/2014

## 2014-10-10 NOTE — Progress Notes (Signed)
[facility] PROGRESS NOTE  Carolyn Henderson [id number]:[id number] DOB: 1930/05/23 DOA: 10/08/2014 PCP: Eulas Post, MD  Assessment/Plan: 1. Generalized weakness and a mechanical fall a week ago: Persistent pain in the right hip brought her to the hospital and she was found to have a sacral fracture.  Pain control and physical therapy.   Hyponatremia: Unclear etiology. ? Dehydration. Started her on NS infusion.    Code Status: full code.  Family Communication: none at bedside Disposition Plan: pending.    Consultants:  Physical therapy  Procedures:  CT pelvis.   Antibiotics:  none  HPI/Subjective: Pain is much better controlled/   Objective: Filed Vitals:   10/10/14 1456  BP: 117/54  Pulse: 64  Temp: 97.8 F (36.6 C)  Resp: 16    Intake/Output Summary (Last 24 hours) at 10/10/14 1757 Last data filed at 10/10/14 1545  Gross per 24 hour  Intake    720 ml  Output    350 ml  Net    370 ml   There were no vitals filed for this visit.  Exam:   General:  Alert afebrile comfortable  Cardiovascular: s1s2  Respiratory: ctab  Abdomen: soft non tender non distended bowel sounds heard  Musculoskeletal: no pedal edmea.   Data Reviewed: Basic Metabolic Panel:  Recent Labs Lab 10/10/14 0503  NA 128*  K 4.0  CL 97  CO2 27  GLUCOSE 96  BUN 19  CREATININE 0.41*  CALCIUM 8.8   Liver Function Tests: No results for input(s): AST, ALT, ALKPHOS, BILITOT, PROT, ALBUMIN in the last 168 hours. No results for input(s): LIPASE, AMYLASE in the last 168 hours. No results for input(s): AMMONIA in the last 168 hours. CBC:  Recent Labs Lab 10/10/14 0503  WBC 5.7  HGB 11.3*  HCT 35.4*  MCV 90.8  PLT 267   Cardiac Enzymes: No results for input(s): CKTOTAL, CKMB, CKMBINDEX, TROPONINI in the last 168 hours. BNP (last 3 results) No results for input(s): PROBNP in the last 8760 hours. CBG: No results for input(s): GLUCAP in the last 168 hours.  No  results found for this or any previous visit (from the past 240 hour(s)).   Studies: Dg Lumbar Spine Complete  10/08/2014   CLINICAL DATA:  Fall fall days ago. Low back pain radiating to right hip.  EXAM: LUMBAR SPINE - COMPLETE 4+ VIEW  COMPARISON:  None.  FINDINGS: No significant joint space narrowing at L3-L4, L4-L5, L5-S1. No acute loss vertebral body height. No subluxation. No pars fracture evident.  IMPRESSION: 1. No acute findings lumbar spine by radiography. 2. Severe disc osteophytic disease from L3 through S1   Electronically Signed   By: Suzy Bouchard M.D.   On: 10/08/2014 20:19   Dg Hip Complete Right  10/08/2014   CLINICAL DATA:  Fall 5 days ago with right hip pain. Right hip revision surgery 08/02/2014.  EXAM: RIGHT HIP - COMPLETE 2+ VIEW  COMPARISON:  08/02/2014  FINDINGS: Left total hip arthroplasty unchanged. Right total hip arthroplasty is intact and normally located. No evidence of acute fracture or dislocation. Moderate degenerate changes of the spine and symphysis pubis joint.  IMPRESSION: No acute findings.  Bilateral total hip arthroplasties unchanged.   Electronically Signed   By: Marin Olp M.D.   On: 10/08/2014 20:18   Ct Pelvis Wo Contrast  10/08/2014   CLINICAL DATA:  Acute onset of right-sided hip pain status post fall, with soft tissue swelling. Initial encounter.  EXAM: CT PELVIS WITHOUT CONTRAST  TECHNIQUE: Multidetector CT imaging of the pelvis was performed following the standard protocol without intravenous contrast.  COMPARISON:  Right hip radiographs performed earlier today at 7:41 p.m.  FINDINGS: There is an acute slightly comminuted fracture extending through the lateral left sacral ala, into the left sacroiliac joint. There is an associated minimally displaced fracture through the left transverse processes of L5.  In addition, there is a poorly characterized minimally displaced fracture line extending across the right ischium, to the acetabular component of  the patient's right hip prosthesis. There appears to be a small amount of overlying heterotopic bone formation, suggesting that this is a subacute or chronic fracture. No definite pubic rami fractures are seen.  The patient's bilateral hip prostheses otherwise appear intact. There is no evidence of loosening. Degenerative change is noted at the pubic symphysis. Vacuum phenomenon and facet disease are noted at the lower lumbar spine, with multilevel disc space narrowing.  Scattered calcification is seen along the abdominal aorta and its branches. Visualized small and large bowel loops are grossly unremarkable. The bladder is mildly distended and grossly unremarkable, though difficult to fully assess due to metal artifact.  Note is made of an incompletely characterized soft tissue hematoma overlying the right greater femoral trochanter, measuring 6.0 cm in size.  IMPRESSION: 1. Acute slightly comminuted fracture extending through the lateral left sacral ala, into the left sacroiliac joint. Associated minimally displaced fracture through the left transverse process of L5. 2. Poorly characterized minimally displaced fracture line extending across the right ischium, to the acetabular component of the patient's right hip prosthesis. There is a small amount of overlying heterotopic bone formation, suggesting that this is subacute or chronic in nature. Would correlate for history of prior falls. 3. No additional fracture seen. 4. Soft tissue hematoma noted overlying the right greater femoral trochanter, measuring 6.0 cm in size.   Electronically Signed   By: Garald Balding M.D.   On: 10/08/2014 22:46    Scheduled Meds: . amLODipine  2.5 mg Oral QPM  . calcium-vitamin D  1 tablet Oral BID  . cholecalciferol  1,000 Units Oral Daily  . gabapentin  300 mg Oral BID  . hydrochlorothiazide  25 mg Oral q morning - 10a  . levothyroxine  125 mcg Oral QAC breakfast  . loratadine  10 mg Oral Q breakfast  .  multivitamin-lutein  1 capsule Oral BID  . vitamin B-12  1,000 mcg Oral Daily   Continuous Infusions: . sodium chloride 75 mL/hr at 10/10/14 1348    Principal Problem:   Sacral fracture, closed    Time spent: 25 minutes/     [facility] Pager [phone]. If 7PM-7AM, please contact night-coverage at [email or web address], password [facility] 10/10/2014, 5:57 PM  LOS: 2 days

## 2014-10-11 DIAGNOSIS — E871 Hypo-osmolality and hyponatremia: Secondary | ICD-10-CM

## 2014-10-11 DIAGNOSIS — S3210XD Unspecified fracture of sacrum, subsequent encounter for fracture with routine healing: Secondary | ICD-10-CM | POA: Diagnosis not present

## 2014-10-11 DIAGNOSIS — Z9181 History of falling: Secondary | ICD-10-CM | POA: Diagnosis not present

## 2014-10-11 DIAGNOSIS — I10 Essential (primary) hypertension: Secondary | ICD-10-CM | POA: Diagnosis not present

## 2014-10-11 DIAGNOSIS — S3210XA Unspecified fracture of sacrum, initial encounter for closed fracture: Secondary | ICD-10-CM | POA: Diagnosis not present

## 2014-10-11 DIAGNOSIS — R102 Pelvic and perineal pain: Secondary | ICD-10-CM | POA: Diagnosis not present

## 2014-10-11 DIAGNOSIS — E039 Hypothyroidism, unspecified: Secondary | ICD-10-CM | POA: Diagnosis not present

## 2014-10-11 DIAGNOSIS — R259 Unspecified abnormal involuntary movements: Secondary | ICD-10-CM | POA: Diagnosis not present

## 2014-10-11 DIAGNOSIS — R531 Weakness: Secondary | ICD-10-CM | POA: Diagnosis not present

## 2014-10-11 LAB — BASIC METABOLIC PANEL
ANION GAP: 3 — AB (ref 5–15)
BUN: 11 mg/dL (ref 6–23)
CO2: 29 mmol/L (ref 19–32)
Calcium: 8.5 mg/dL (ref 8.4–10.5)
Chloride: 98 mEq/L (ref 96–112)
Creatinine, Ser: 0.39 mg/dL — ABNORMAL LOW (ref 0.50–1.10)
Glucose, Bld: 92 mg/dL (ref 70–99)
POTASSIUM: 4.2 mmol/L (ref 3.5–5.1)
SODIUM: 130 mmol/L — AB (ref 135–145)

## 2014-10-11 LAB — OSMOLALITY, URINE: Osmolality, Ur: 210 mOsm/kg — ABNORMAL LOW (ref 390–1090)

## 2014-10-11 LAB — SODIUM, URINE, RANDOM: SODIUM UR: 42 meq/L

## 2014-10-11 LAB — TSH: TSH: 6.666 u[IU]/mL — AB (ref 0.350–4.500)

## 2014-10-11 LAB — OSMOLALITY: OSMOLALITY: 275 mosm/kg (ref 275–300)

## 2014-10-11 LAB — CORTISOL: CORTISOL PLASMA: 3 ug/dL

## 2014-10-11 MED ORDER — ROPINIROLE HCL 0.25 MG PO TABS: 0.2500 mg | ORAL_TABLET | Freq: Every day | ORAL | Status: DC

## 2014-10-11 MED ORDER — DIPHENHYDRAMINE HCL 25 MG PO CAPS: 25.0000 mg | ORAL_CAPSULE | Freq: Three times a day (TID) | ORAL | Status: DC | PRN

## 2014-10-11 MED ORDER — ROPINIROLE HCL 0.25 MG PO TABS
0.2500 mg | ORAL_TABLET | Freq: Every day | ORAL | Status: DC
  Filled 2014-10-11: qty 1

## 2014-10-11 MED ORDER — HYDROCODONE-ACETAMINOPHEN 5-325 MG PO TABS: 1.0000 | ORAL_TABLET | ORAL | Status: DC | PRN

## 2014-10-11 NOTE — Progress Notes (Signed)
Pt leaving at this time with EMS, headed to [facility]. Spouse at bedside and aware of transfer. Pt alert, oriented, and without c/o.

## 2014-10-11 NOTE — Discharge Summary (Signed)
Physician Discharge Summary  Carolyn Henderson FBP:[id number] DOB: 04/06/1930 DOA: 10/08/2014  PCP: Eulas Post, MD  Admit date: 10/08/2014 Discharge date: 10/11/2014  Time spent: 30 minutes  Recommendations for Outpatient Follow-up:  1. Follow upw ith Dr Alvan Dame in one week 2. Follow up with PCP in 1 to 2 weeks.  3. Follow up with BMP in 1 to 2 days.   Discharge Diagnoses:  Principal Problem:   Sacral fracture, closed Active Problems:   Hyponatremia   Discharge Condition: improved  Diet recommendation: regular diet.   There were no vitals filed for this visit.  History of present illness:  Carolyn Henderson is a 80 y.o. female who presents to the ED after a fall on Sunday. Since then she has had progressive worsening of R hip pain with ambulation and gait instability / she was found to have sacral fracture  Hospital Course:  1. Generalized weakness and a mechanical fall a week ago: Persistent pain in the right hip brought her to the hospital and she was found to have a sacral fracture.  Pain control and physical therapy. Recommend outpatient follow up with Dr Alvan Dame in one week.   Hyponatremia: Unclear etiology. ? Dehydration vs sec to HCTZ. . Started her on NS infusion, Her levels improved and HCTZ stopped.  Recommend checking another level in 2 days.   Hypertension controlled.   Restless leg syndrome: Started her requip.    Procedures:  CT pelvis  Consultations:  Physical therapy  Discharge Exam: Filed Vitals:   10/11/14 0550  BP: 132/62  Pulse: 64  Temp: 97.5 F (36.4 C)  Resp: 16    General: alert afebrile comfortable Cardiovascular: s1s2 Respiratory: ctab  Discharge Instructions   Discharge Instructions    Diet - low sodium heart healthy    Complete by:  As directed      Discharge instructions    Complete by:  As directed   FOLLOW UP with Dr Alvan Dame in one week.  Please check sodium level in 2 days.          Current Discharge Medication  List    START taking these medications   Details  HYDROcodone-acetaminophen (NORCO/VICODIN) 5-325 MG per tablet Take 1-2 tablets by mouth every 4 (four) hours as needed for moderate pain. Qty: 30 tablet, Refills: 0    rOPINIRole (REQUIP) 0.25 MG tablet Take 1 tablet (0.25 mg total) by mouth at bedtime.      CONTINUE these medications which have NOT CHANGED   Details  acetaminophen (TYLENOL) 325 MG tablet Take 2 tablets (650 mg total) by mouth every 6 (six) hours as needed for mild pain (or Fever >/= 101).    amLODipine (NORVASC) 2.5 MG tablet Take 2.5 mg by mouth every evening.    Calcium Carbonate-Vitamin D 600-400 MG-UNIT per tablet Take 1 tablet by mouth 2 (two) times daily.     Carboxymethylcell-Hypromellose (GENTEAL OP) Apply 1 drop to eye at bedtime.    cetirizine (ZYRTEC) 10 MG tablet Take 10 mg by mouth at bedtime.     cholecalciferol (VITAMIN D) 1000 UNITS tablet Take 1,000 Units by mouth daily.    clobetasol ointment (TEMOVATE) 8.24 % Apply 1 application topically 2 (two) times daily as needed (for eczema).    diphenhydrAMINE (BENADRYL) 25 MG tablet Take 50 mg by mouth at bedtime.     gabapentin (NEURONTIN) 300 MG capsule Take 300 mg by mouth 2 (two) times daily.    methocarbamol (ROBAXIN) 500 MG tablet Take 500 mg  by mouth every 6 (six) hours as needed for muscle spasms.  Refills: 0    Multiple Vitamins-Minerals (PRESERVISION AREDS 2) CAPS Take 1 capsule by mouth 2 (two) times daily.     Polyethyl Glycol-Propyl Glycol (SYSTANE OP) Apply 1 drop to eye 3 (three) times daily as needed (for dry eyes).     SYNTHROID 125 MCG tablet Take 125 mcg by mouth daily before breakfast.     tacrolimus (PROTOPIC) 0.1 % ointment Apply 0.1 % topically daily as needed (for itchy eyelids).     vitamin B-12 (CYANOCOBALAMIN) 1000 MCG tablet Take 1,000 mcg by mouth daily.      STOP taking these medications     hydrochlorothiazide (HYDRODIURIL) 25 MG tablet      ibuprofen  (ADVIL,MOTRIN) 200 MG tablet      loratadine (CLARITIN) 10 MG tablet        Allergies  Allergen Reactions  . Cephalosporins     Unknown reaction   . Codeine Sulfate Nausea And Vomiting  . Hydrocodone Itching and Other (See Comments)  . Tramadol Nausea Only  . Penicillins Rash      The results of significant diagnostics from this hospitalization (including imaging, microbiology, ancillary and laboratory) are listed below for reference.    Significant Diagnostic Studies: Dg Lumbar Spine Complete  10/08/2014   CLINICAL DATA:  Fall fall days ago. Low back pain radiating to right hip.  EXAM: LUMBAR SPINE - COMPLETE 4+ VIEW  COMPARISON:  None.  FINDINGS: No significant joint space narrowing at L3-L4, L4-L5, L5-S1. No acute loss vertebral body height. No subluxation. No pars fracture evident.  IMPRESSION: 1. No acute findings lumbar spine by radiography. 2. Severe disc osteophytic disease from L3 through S1   Electronically Signed   By: Suzy Bouchard M.D.   On: 10/08/2014 20:19   Dg Hip Complete Right  10/08/2014   CLINICAL DATA:  Fall 5 days ago with right hip pain. Right hip revision surgery 08/02/2014.  EXAM: RIGHT HIP - COMPLETE 2+ VIEW  COMPARISON:  08/02/2014  FINDINGS: Left total hip arthroplasty unchanged. Right total hip arthroplasty is intact and normally located. No evidence of acute fracture or dislocation. Moderate degenerate changes of the spine and symphysis pubis joint.  IMPRESSION: No acute findings.  Bilateral total hip arthroplasties unchanged.   Electronically Signed   By: Marin Olp M.D.   On: 10/08/2014 20:18   Ct Pelvis Wo Contrast  10/08/2014   CLINICAL DATA:  Acute onset of right-sided hip pain status post fall, with soft tissue swelling. Initial encounter.  EXAM: CT PELVIS WITHOUT CONTRAST  TECHNIQUE: Multidetector CT imaging of the pelvis was performed following the standard protocol without intravenous contrast.  COMPARISON:  Right hip radiographs performed  earlier today at 7:41 p.m.  FINDINGS: There is an acute slightly comminuted fracture extending through the lateral left sacral ala, into the left sacroiliac joint. There is an associated minimally displaced fracture through the left transverse processes of L5.  In addition, there is a poorly characterized minimally displaced fracture line extending across the right ischium, to the acetabular component of the patient's right hip prosthesis. There appears to be a small amount of overlying heterotopic bone formation, suggesting that this is a subacute or chronic fracture. No definite pubic rami fractures are seen.  The patient's bilateral hip prostheses otherwise appear intact. There is no evidence of loosening. Degenerative change is noted at the pubic symphysis. Vacuum phenomenon and facet disease are noted at the lower lumbar spine, with  multilevel disc space narrowing.  Scattered calcification is seen along the abdominal aorta and its branches. Visualized small and large bowel loops are grossly unremarkable. The bladder is mildly distended and grossly unremarkable, though difficult to fully assess due to metal artifact.  Note is made of an incompletely characterized soft tissue hematoma overlying the right greater femoral trochanter, measuring 6.0 cm in size.  IMPRESSION: 1. Acute slightly comminuted fracture extending through the lateral left sacral ala, into the left sacroiliac joint. Associated minimally displaced fracture through the left transverse process of L5. 2. Poorly characterized minimally displaced fracture line extending across the right ischium, to the acetabular component of the patient's right hip prosthesis. There is a small amount of overlying heterotopic bone formation, suggesting that this is subacute or chronic in nature. Would correlate for history of prior falls. 3. No additional fracture seen. 4. Soft tissue hematoma noted overlying the right greater femoral trochanter, measuring 6.0 cm in  size.   Electronically Signed   By: Garald Balding M.D.   On: 10/08/2014 22:46    Microbiology: No results found for this or any previous visit (from the past 240 hour(s)).   Labs: Basic Metabolic Panel:  Recent Labs Lab 10/10/14 0503 10/11/14 0520  NA 128* 130*  K 4.0 4.2  CL 97 98  CO2 27 29  GLUCOSE 96 92  BUN 19 11  CREATININE 0.41* 0.39*  CALCIUM 8.8 8.5   Liver Function Tests: No results for input(s): AST, ALT, ALKPHOS, BILITOT, PROT, ALBUMIN in the last 168 hours. No results for input(s): LIPASE, AMYLASE in the last 168 hours. No results for input(s): AMMONIA in the last 168 hours. CBC:  Recent Labs Lab 10/10/14 0503  WBC 5.7  HGB 11.3*  HCT 35.4*  MCV 90.8  PLT 267   Cardiac Enzymes: No results for input(s): CKTOTAL, CKMB, CKMBINDEX, TROPONINI in the last 168 hours. BNP: BNP (last 3 results) No results for input(s): PROBNP in the last 8760 hours. CBG: No results for input(s): GLUCAP in the last 168 hours.     SignedHosie Poisson  [facility] 10/11/2014, 10:45 AM

## 2014-10-11 NOTE — Progress Notes (Signed)
Clinical Social Work Department CLINICAL SOCIAL WORK PLACEMENT NOTE 10/11/2014  Patient:  Carolyn Henderson, Carolyn Henderson  Account Number:  [id number] Admit date:  10/08/2014  Clinical Social Worker:  Sindy Messing, LCSW  Date/time:  10/11/2014 11:45 AM  Clinical Social Work is seeking post-discharge placement for this patient at the following level of care:   SKILLED NURSING   (*CSW will update this form in Epic as items are completed)   10/11/2014  Patient/family provided with [facility] Department of Clinical Social Work's list of facilities offering this level of care within the geographic area requested by the patient (or if unable, by the patient's family).  10/11/2014  Patient/family informed of their freedom to choose among providers that offer the needed level of care, that participate in Medicare, Medicaid or managed care program needed by the patient, have an available bed and are willing to accept the patient.  10/11/2014  Patient/family informed of [facility]' ownership interest in [facility], as well as of the fact that they are under no obligation to receive care at this facility.  PASARR submitted to EDS on existing # PASARR number received on   FL2 transmitted to all facilities in geographic area requested by pt/family on  10/11/2014 FL2 transmitted to all facilities within larger geographic area on   Patient informed that his/her managed care company has contracts with or will negotiate with  certain facilities, including the following:     Patient/family informed of bed offers received:  10/11/2014 Patient chooses bed at [facility] [place] Physician recommends and patient chooses bed at    Patient to be transferred to [facility] on  10/11/2014 Patient to be transferred to facility by [facility] Patient and family notified of transfer on 10/11/2014 Name of family member notified:  Lewis=Husband at bedside  The following  physician request were entered in Epic:   Additional Comments:

## 2014-10-11 NOTE — Progress Notes (Signed)
Have given report to receiving nurse at [facility].

## 2014-10-11 NOTE — Progress Notes (Signed)
Clinical Social Work Department BRIEF PSYCHOSOCIAL ASSESSMENT 10/11/2014  Patient:  Carolyn Henderson, Carolyn Henderson     Account Number:  [id number]     Admit date:  10/08/2014  Clinical Social Worker:  Earlie Server  Date/Time:  10/11/2014 11:45 AM  Referred by:  Physician  Date Referred:  10/11/2014 Referred for  SNF Placement   Other Referral:   Interview type:  Patient Other interview type:    PSYCHOSOCIAL DATA Living Status:  FAMILY Admitted from facility:   Level of care:   Primary support name:  Lewis Primary support relationship to patient:  SPOUSE Degree of support available:   Strong    CURRENT CONCERNS Current Concerns  Post-Acute Placement   Other Concerns:    SOCIAL WORK ASSESSMENT / PLAN CSW received referral in order to assist with DC planning. CSW reviewed chart and met with patient and husband at bedside. CSW introduced myself and explained role.    Patient and husband have been married for over 33 years and patient was living at home with husband prior to admission. Patient reports she has been to rehab in the past and was not feeling well so she had to be admitted to the hospital again. Patient has been to [facility] in the past and would like to return there. CSW spoke with [facility] who reports no available space until Wednesday or Thursday. CSW shared this information with patient and family who reports that [facility] is their second preference. CSW spoke with [facility] who reports they can accept patient today.    CSW will assist with DC planning.   Assessment/plan status:  Psychosocial Support/Ongoing Assessment of Needs Other assessment/ plan:   Information/referral to community resources:   SNF information    PATIENT'S/FAMILY'S RESPONSE TO PLAN OF CARE: Patient alert and oriented. Patient engaged in assessment and reports she has supportive family but knows she needs further assistance prior to returning home. Patient reports she enjoyed [facility] but  [facility] is close to dtr and is excited that family will be able to come and visit her. Patient thanked CSW for information and reports she is hopeful to return home quickly.       [place], [place] [phone]

## 2014-10-11 NOTE — Progress Notes (Signed)
Clinical Social Work  CSW faxed DC summary to [facility] who is agreeable to accept patient today. CSW prepared DC packet with DC summary, FL2 and hard scripts included. CSW informed patient and husband of DC plans. Patient prefers [facility] for transportation and is aware of no guarantee of payment. RN to call report to SNF. [facility] arranged for 1:30 pick up. [facility] request #: [id number].  CSW is signing off but available if needed.  [place], [place] [phone]

## 2014-10-12 DIAGNOSIS — I10 Essential (primary) hypertension: Secondary | ICD-10-CM | POA: Diagnosis not present

## 2014-10-12 DIAGNOSIS — S3210XA Unspecified fracture of sacrum, initial encounter for closed fracture: Secondary | ICD-10-CM | POA: Diagnosis not present

## 2014-10-12 DIAGNOSIS — E871 Hypo-osmolality and hyponatremia: Secondary | ICD-10-CM | POA: Diagnosis not present

## 2014-10-12 DIAGNOSIS — E039 Hypothyroidism, unspecified: Secondary | ICD-10-CM | POA: Diagnosis not present

## 2014-10-12 LAB — T3, FREE: T3 FREE: 2.5 pg/mL (ref 2.3–4.2)

## 2014-10-12 LAB — T4, FREE: FREE T4: 1.53 ng/dL (ref 0.80–1.80)

## 2014-10-14 DIAGNOSIS — R531 Weakness: Secondary | ICD-10-CM | POA: Diagnosis not present

## 2014-10-14 DIAGNOSIS — E079 Disorder of thyroid, unspecified: Secondary | ICD-10-CM | POA: Diagnosis not present

## 2014-10-14 DIAGNOSIS — S3210XD Unspecified fracture of sacrum, subsequent encounter for fracture with routine healing: Secondary | ICD-10-CM | POA: Diagnosis not present

## 2014-10-14 DIAGNOSIS — Z9181 History of falling: Secondary | ICD-10-CM | POA: Diagnosis not present

## 2014-10-14 DIAGNOSIS — K219 Gastro-esophageal reflux disease without esophagitis: Secondary | ICD-10-CM | POA: Diagnosis not present

## 2014-10-14 DIAGNOSIS — Z8619 Personal history of other infectious and parasitic diseases: Secondary | ICD-10-CM | POA: Diagnosis not present

## 2014-10-14 DIAGNOSIS — B0223 Postherpetic polyneuropathy: Secondary | ICD-10-CM | POA: Diagnosis not present

## 2014-10-14 DIAGNOSIS — K5732 Diverticulitis of large intestine without perforation or abscess without bleeding: Secondary | ICD-10-CM | POA: Diagnosis not present

## 2014-10-14 DIAGNOSIS — E039 Hypothyroidism, unspecified: Secondary | ICD-10-CM | POA: Diagnosis not present

## 2014-10-14 DIAGNOSIS — I10 Essential (primary) hypertension: Secondary | ICD-10-CM | POA: Diagnosis not present

## 2014-10-14 DIAGNOSIS — G2581 Restless legs syndrome: Secondary | ICD-10-CM | POA: Diagnosis not present

## 2014-10-14 DIAGNOSIS — E871 Hypo-osmolality and hyponatremia: Secondary | ICD-10-CM | POA: Diagnosis not present

## 2014-10-14 DIAGNOSIS — M129 Arthropathy, unspecified: Secondary | ICD-10-CM | POA: Diagnosis not present

## 2014-10-16 DIAGNOSIS — E079 Disorder of thyroid, unspecified: Secondary | ICD-10-CM | POA: Diagnosis not present

## 2014-10-16 DIAGNOSIS — Z9181 History of falling: Secondary | ICD-10-CM | POA: Diagnosis not present

## 2014-10-16 DIAGNOSIS — B0223 Postherpetic polyneuropathy: Secondary | ICD-10-CM | POA: Diagnosis not present

## 2014-10-16 DIAGNOSIS — I10 Essential (primary) hypertension: Secondary | ICD-10-CM | POA: Diagnosis not present

## 2014-10-16 DIAGNOSIS — S3210XD Unspecified fracture of sacrum, subsequent encounter for fracture with routine healing: Secondary | ICD-10-CM | POA: Diagnosis not present

## 2014-10-16 DIAGNOSIS — G2581 Restless legs syndrome: Secondary | ICD-10-CM | POA: Diagnosis not present

## 2014-10-29 DIAGNOSIS — B0229 Other postherpetic nervous system involvement: Secondary | ICD-10-CM | POA: Diagnosis not present

## 2014-10-29 DIAGNOSIS — S3210XD Unspecified fracture of sacrum, subsequent encounter for fracture with routine healing: Secondary | ICD-10-CM | POA: Diagnosis not present

## 2014-10-29 DIAGNOSIS — I10 Essential (primary) hypertension: Secondary | ICD-10-CM | POA: Diagnosis not present

## 2014-10-29 DIAGNOSIS — M8580 Other specified disorders of bone density and structure, unspecified site: Secondary | ICD-10-CM | POA: Diagnosis not present

## 2014-10-29 DIAGNOSIS — M1990 Unspecified osteoarthritis, unspecified site: Secondary | ICD-10-CM | POA: Diagnosis not present

## 2014-10-29 DIAGNOSIS — G2581 Restless legs syndrome: Secondary | ICD-10-CM | POA: Diagnosis not present

## 2014-10-31 DIAGNOSIS — B0229 Other postherpetic nervous system involvement: Secondary | ICD-10-CM | POA: Diagnosis not present

## 2014-10-31 DIAGNOSIS — M1990 Unspecified osteoarthritis, unspecified site: Secondary | ICD-10-CM | POA: Diagnosis not present

## 2014-10-31 DIAGNOSIS — M8580 Other specified disorders of bone density and structure, unspecified site: Secondary | ICD-10-CM | POA: Diagnosis not present

## 2014-10-31 DIAGNOSIS — I10 Essential (primary) hypertension: Secondary | ICD-10-CM | POA: Diagnosis not present

## 2014-10-31 DIAGNOSIS — S3210XD Unspecified fracture of sacrum, subsequent encounter for fracture with routine healing: Secondary | ICD-10-CM | POA: Diagnosis not present

## 2014-10-31 DIAGNOSIS — G2581 Restless legs syndrome: Secondary | ICD-10-CM | POA: Diagnosis not present

## 2014-11-02 ENCOUNTER — Telehealth: Payer: Self-pay | Admitting: Family Medicine

## 2014-11-02 DIAGNOSIS — M8580 Other specified disorders of bone density and structure, unspecified site: Secondary | ICD-10-CM | POA: Diagnosis not present

## 2014-11-02 DIAGNOSIS — S3210XD Unspecified fracture of sacrum, subsequent encounter for fracture with routine healing: Secondary | ICD-10-CM | POA: Diagnosis not present

## 2014-11-02 DIAGNOSIS — G2581 Restless legs syndrome: Secondary | ICD-10-CM | POA: Diagnosis not present

## 2014-11-02 DIAGNOSIS — B0229 Other postherpetic nervous system involvement: Secondary | ICD-10-CM | POA: Diagnosis not present

## 2014-11-02 DIAGNOSIS — I10 Essential (primary) hypertension: Secondary | ICD-10-CM | POA: Diagnosis not present

## 2014-11-02 DIAGNOSIS — M1990 Unspecified osteoarthritis, unspecified site: Secondary | ICD-10-CM | POA: Diagnosis not present

## 2014-11-02 NOTE — Telephone Encounter (Signed)
pls advise

## 2014-11-02 NOTE — Telephone Encounter (Signed)
OK 

## 2014-11-02 NOTE — Telephone Encounter (Signed)
Levada Dy from [place] is calling requesting nurse visits  twice a wk for 4 wks and then once a wk for 4 wks. For medical and medication management. Please call with verbal order

## 2014-11-03 NOTE — Telephone Encounter (Signed)
Left a message for return call.  

## 2014-11-04 DIAGNOSIS — B0229 Other postherpetic nervous system involvement: Secondary | ICD-10-CM | POA: Diagnosis not present

## 2014-11-04 DIAGNOSIS — Z471 Aftercare following joint replacement surgery: Secondary | ICD-10-CM | POA: Diagnosis not present

## 2014-11-04 DIAGNOSIS — S32501D Unspecified fracture of right pubis, subsequent encounter for fracture with routine healing: Secondary | ICD-10-CM | POA: Diagnosis not present

## 2014-11-04 DIAGNOSIS — Z96641 Presence of right artificial hip joint: Secondary | ICD-10-CM | POA: Diagnosis not present

## 2014-11-04 DIAGNOSIS — S7001XD Contusion of right hip, subsequent encounter: Secondary | ICD-10-CM | POA: Diagnosis not present

## 2014-11-04 DIAGNOSIS — I10 Essential (primary) hypertension: Secondary | ICD-10-CM | POA: Diagnosis not present

## 2014-11-04 DIAGNOSIS — M8580 Other specified disorders of bone density and structure, unspecified site: Secondary | ICD-10-CM | POA: Diagnosis not present

## 2014-11-04 DIAGNOSIS — S3210XD Unspecified fracture of sacrum, subsequent encounter for fracture with routine healing: Secondary | ICD-10-CM | POA: Diagnosis not present

## 2014-11-04 DIAGNOSIS — M1990 Unspecified osteoarthritis, unspecified site: Secondary | ICD-10-CM | POA: Diagnosis not present

## 2014-11-04 DIAGNOSIS — G2581 Restless legs syndrome: Secondary | ICD-10-CM | POA: Diagnosis not present

## 2014-11-04 NOTE — Telephone Encounter (Signed)
Left detailed message on Angela VM.

## 2014-11-08 DIAGNOSIS — I10 Essential (primary) hypertension: Secondary | ICD-10-CM | POA: Diagnosis not present

## 2014-11-08 DIAGNOSIS — G2581 Restless legs syndrome: Secondary | ICD-10-CM | POA: Diagnosis not present

## 2014-11-08 DIAGNOSIS — S3210XD Unspecified fracture of sacrum, subsequent encounter for fracture with routine healing: Secondary | ICD-10-CM | POA: Diagnosis not present

## 2014-11-08 DIAGNOSIS — B0229 Other postherpetic nervous system involvement: Secondary | ICD-10-CM | POA: Diagnosis not present

## 2014-11-08 DIAGNOSIS — M1990 Unspecified osteoarthritis, unspecified site: Secondary | ICD-10-CM | POA: Diagnosis not present

## 2014-11-08 DIAGNOSIS — M8580 Other specified disorders of bone density and structure, unspecified site: Secondary | ICD-10-CM | POA: Diagnosis not present

## 2014-11-09 DIAGNOSIS — M1990 Unspecified osteoarthritis, unspecified site: Secondary | ICD-10-CM | POA: Diagnosis not present

## 2014-11-09 DIAGNOSIS — I10 Essential (primary) hypertension: Secondary | ICD-10-CM | POA: Diagnosis not present

## 2014-11-09 DIAGNOSIS — B0229 Other postherpetic nervous system involvement: Secondary | ICD-10-CM | POA: Diagnosis not present

## 2014-11-09 DIAGNOSIS — G2581 Restless legs syndrome: Secondary | ICD-10-CM | POA: Diagnosis not present

## 2014-11-09 DIAGNOSIS — M8580 Other specified disorders of bone density and structure, unspecified site: Secondary | ICD-10-CM | POA: Diagnosis not present

## 2014-11-09 DIAGNOSIS — S3210XD Unspecified fracture of sacrum, subsequent encounter for fracture with routine healing: Secondary | ICD-10-CM | POA: Diagnosis not present

## 2014-11-10 DIAGNOSIS — S3210XD Unspecified fracture of sacrum, subsequent encounter for fracture with routine healing: Secondary | ICD-10-CM | POA: Diagnosis not present

## 2014-11-10 DIAGNOSIS — G2581 Restless legs syndrome: Secondary | ICD-10-CM | POA: Diagnosis not present

## 2014-11-10 DIAGNOSIS — I10 Essential (primary) hypertension: Secondary | ICD-10-CM | POA: Diagnosis not present

## 2014-11-10 DIAGNOSIS — M1990 Unspecified osteoarthritis, unspecified site: Secondary | ICD-10-CM | POA: Diagnosis not present

## 2014-11-10 DIAGNOSIS — B0229 Other postherpetic nervous system involvement: Secondary | ICD-10-CM | POA: Diagnosis not present

## 2014-11-10 DIAGNOSIS — M8580 Other specified disorders of bone density and structure, unspecified site: Secondary | ICD-10-CM | POA: Diagnosis not present

## 2014-11-12 DIAGNOSIS — S3210XD Unspecified fracture of sacrum, subsequent encounter for fracture with routine healing: Secondary | ICD-10-CM | POA: Diagnosis not present

## 2014-11-12 DIAGNOSIS — G2581 Restless legs syndrome: Secondary | ICD-10-CM | POA: Diagnosis not present

## 2014-11-12 DIAGNOSIS — M1990 Unspecified osteoarthritis, unspecified site: Secondary | ICD-10-CM | POA: Diagnosis not present

## 2014-11-12 DIAGNOSIS — I10 Essential (primary) hypertension: Secondary | ICD-10-CM | POA: Diagnosis not present

## 2014-11-12 DIAGNOSIS — M8580 Other specified disorders of bone density and structure, unspecified site: Secondary | ICD-10-CM | POA: Diagnosis not present

## 2014-11-12 DIAGNOSIS — B0229 Other postherpetic nervous system involvement: Secondary | ICD-10-CM | POA: Diagnosis not present

## 2014-11-15 DIAGNOSIS — I10 Essential (primary) hypertension: Secondary | ICD-10-CM | POA: Diagnosis not present

## 2014-11-15 DIAGNOSIS — S3210XD Unspecified fracture of sacrum, subsequent encounter for fracture with routine healing: Secondary | ICD-10-CM | POA: Diagnosis not present

## 2014-11-15 DIAGNOSIS — M8580 Other specified disorders of bone density and structure, unspecified site: Secondary | ICD-10-CM | POA: Diagnosis not present

## 2014-11-15 DIAGNOSIS — G2581 Restless legs syndrome: Secondary | ICD-10-CM | POA: Diagnosis not present

## 2014-11-15 DIAGNOSIS — M1990 Unspecified osteoarthritis, unspecified site: Secondary | ICD-10-CM | POA: Diagnosis not present

## 2014-11-15 DIAGNOSIS — B0229 Other postherpetic nervous system involvement: Secondary | ICD-10-CM | POA: Diagnosis not present

## 2014-11-16 DIAGNOSIS — M8580 Other specified disorders of bone density and structure, unspecified site: Secondary | ICD-10-CM | POA: Diagnosis not present

## 2014-11-16 DIAGNOSIS — G2581 Restless legs syndrome: Secondary | ICD-10-CM | POA: Diagnosis not present

## 2014-11-16 DIAGNOSIS — S3210XD Unspecified fracture of sacrum, subsequent encounter for fracture with routine healing: Secondary | ICD-10-CM | POA: Diagnosis not present

## 2014-11-16 DIAGNOSIS — M1990 Unspecified osteoarthritis, unspecified site: Secondary | ICD-10-CM | POA: Diagnosis not present

## 2014-11-16 DIAGNOSIS — I10 Essential (primary) hypertension: Secondary | ICD-10-CM | POA: Diagnosis not present

## 2014-11-16 DIAGNOSIS — B0229 Other postherpetic nervous system involvement: Secondary | ICD-10-CM | POA: Diagnosis not present

## 2014-11-17 DIAGNOSIS — B0229 Other postherpetic nervous system involvement: Secondary | ICD-10-CM | POA: Diagnosis not present

## 2014-11-17 DIAGNOSIS — M1990 Unspecified osteoarthritis, unspecified site: Secondary | ICD-10-CM | POA: Diagnosis not present

## 2014-11-17 DIAGNOSIS — I10 Essential (primary) hypertension: Secondary | ICD-10-CM | POA: Diagnosis not present

## 2014-11-17 DIAGNOSIS — M8580 Other specified disorders of bone density and structure, unspecified site: Secondary | ICD-10-CM | POA: Diagnosis not present

## 2014-11-17 DIAGNOSIS — S3210XD Unspecified fracture of sacrum, subsequent encounter for fracture with routine healing: Secondary | ICD-10-CM | POA: Diagnosis not present

## 2014-11-17 DIAGNOSIS — G2581 Restless legs syndrome: Secondary | ICD-10-CM | POA: Diagnosis not present

## 2014-11-18 DIAGNOSIS — I10 Essential (primary) hypertension: Secondary | ICD-10-CM | POA: Diagnosis not present

## 2014-11-18 DIAGNOSIS — G2581 Restless legs syndrome: Secondary | ICD-10-CM | POA: Diagnosis not present

## 2014-11-18 DIAGNOSIS — S3210XD Unspecified fracture of sacrum, subsequent encounter for fracture with routine healing: Secondary | ICD-10-CM | POA: Diagnosis not present

## 2014-11-18 DIAGNOSIS — B0229 Other postherpetic nervous system involvement: Secondary | ICD-10-CM | POA: Diagnosis not present

## 2014-11-18 DIAGNOSIS — M1990 Unspecified osteoarthritis, unspecified site: Secondary | ICD-10-CM | POA: Diagnosis not present

## 2014-11-18 DIAGNOSIS — M8580 Other specified disorders of bone density and structure, unspecified site: Secondary | ICD-10-CM | POA: Diagnosis not present

## 2014-11-19 DIAGNOSIS — G2581 Restless legs syndrome: Secondary | ICD-10-CM | POA: Diagnosis not present

## 2014-11-19 DIAGNOSIS — B0229 Other postherpetic nervous system involvement: Secondary | ICD-10-CM | POA: Diagnosis not present

## 2014-11-19 DIAGNOSIS — S3210XD Unspecified fracture of sacrum, subsequent encounter for fracture with routine healing: Secondary | ICD-10-CM | POA: Diagnosis not present

## 2014-11-19 DIAGNOSIS — M8580 Other specified disorders of bone density and structure, unspecified site: Secondary | ICD-10-CM | POA: Diagnosis not present

## 2014-11-19 DIAGNOSIS — M1990 Unspecified osteoarthritis, unspecified site: Secondary | ICD-10-CM | POA: Diagnosis not present

## 2014-11-19 DIAGNOSIS — I10 Essential (primary) hypertension: Secondary | ICD-10-CM | POA: Diagnosis not present

## 2014-11-22 DIAGNOSIS — B0229 Other postherpetic nervous system involvement: Secondary | ICD-10-CM | POA: Diagnosis not present

## 2014-11-22 DIAGNOSIS — M8580 Other specified disorders of bone density and structure, unspecified site: Secondary | ICD-10-CM | POA: Diagnosis not present

## 2014-11-22 DIAGNOSIS — I10 Essential (primary) hypertension: Secondary | ICD-10-CM | POA: Diagnosis not present

## 2014-11-22 DIAGNOSIS — S3210XD Unspecified fracture of sacrum, subsequent encounter for fracture with routine healing: Secondary | ICD-10-CM | POA: Diagnosis not present

## 2014-11-22 DIAGNOSIS — G2581 Restless legs syndrome: Secondary | ICD-10-CM | POA: Diagnosis not present

## 2014-11-22 DIAGNOSIS — M1990 Unspecified osteoarthritis, unspecified site: Secondary | ICD-10-CM | POA: Diagnosis not present

## 2014-11-23 DIAGNOSIS — I10 Essential (primary) hypertension: Secondary | ICD-10-CM | POA: Diagnosis not present

## 2014-11-23 DIAGNOSIS — G2581 Restless legs syndrome: Secondary | ICD-10-CM | POA: Diagnosis not present

## 2014-11-23 DIAGNOSIS — M1990 Unspecified osteoarthritis, unspecified site: Secondary | ICD-10-CM | POA: Diagnosis not present

## 2014-11-23 DIAGNOSIS — S3210XD Unspecified fracture of sacrum, subsequent encounter for fracture with routine healing: Secondary | ICD-10-CM | POA: Diagnosis not present

## 2014-11-23 DIAGNOSIS — M8580 Other specified disorders of bone density and structure, unspecified site: Secondary | ICD-10-CM | POA: Diagnosis not present

## 2014-11-23 DIAGNOSIS — B0229 Other postherpetic nervous system involvement: Secondary | ICD-10-CM | POA: Diagnosis not present

## 2014-11-24 DIAGNOSIS — M8580 Other specified disorders of bone density and structure, unspecified site: Secondary | ICD-10-CM | POA: Diagnosis not present

## 2014-11-24 DIAGNOSIS — B0229 Other postherpetic nervous system involvement: Secondary | ICD-10-CM | POA: Diagnosis not present

## 2014-11-24 DIAGNOSIS — G2581 Restless legs syndrome: Secondary | ICD-10-CM | POA: Diagnosis not present

## 2014-11-24 DIAGNOSIS — M1990 Unspecified osteoarthritis, unspecified site: Secondary | ICD-10-CM | POA: Diagnosis not present

## 2014-11-24 DIAGNOSIS — I10 Essential (primary) hypertension: Secondary | ICD-10-CM | POA: Diagnosis not present

## 2014-11-24 DIAGNOSIS — S3210XD Unspecified fracture of sacrum, subsequent encounter for fracture with routine healing: Secondary | ICD-10-CM | POA: Diagnosis not present

## 2014-11-26 DIAGNOSIS — M8580 Other specified disorders of bone density and structure, unspecified site: Secondary | ICD-10-CM | POA: Diagnosis not present

## 2014-11-26 DIAGNOSIS — B0229 Other postherpetic nervous system involvement: Secondary | ICD-10-CM | POA: Diagnosis not present

## 2014-11-26 DIAGNOSIS — S3210XD Unspecified fracture of sacrum, subsequent encounter for fracture with routine healing: Secondary | ICD-10-CM | POA: Diagnosis not present

## 2014-11-26 DIAGNOSIS — I10 Essential (primary) hypertension: Secondary | ICD-10-CM | POA: Diagnosis not present

## 2014-11-26 DIAGNOSIS — M1990 Unspecified osteoarthritis, unspecified site: Secondary | ICD-10-CM | POA: Diagnosis not present

## 2014-11-26 DIAGNOSIS — G2581 Restless legs syndrome: Secondary | ICD-10-CM | POA: Diagnosis not present

## 2014-11-30 DIAGNOSIS — G2581 Restless legs syndrome: Secondary | ICD-10-CM | POA: Diagnosis not present

## 2014-11-30 DIAGNOSIS — M1990 Unspecified osteoarthritis, unspecified site: Secondary | ICD-10-CM | POA: Diagnosis not present

## 2014-11-30 DIAGNOSIS — I10 Essential (primary) hypertension: Secondary | ICD-10-CM | POA: Diagnosis not present

## 2014-11-30 DIAGNOSIS — B0229 Other postherpetic nervous system involvement: Secondary | ICD-10-CM | POA: Diagnosis not present

## 2014-11-30 DIAGNOSIS — M8580 Other specified disorders of bone density and structure, unspecified site: Secondary | ICD-10-CM | POA: Diagnosis not present

## 2014-11-30 DIAGNOSIS — S3210XD Unspecified fracture of sacrum, subsequent encounter for fracture with routine healing: Secondary | ICD-10-CM | POA: Diagnosis not present

## 2014-12-01 DIAGNOSIS — B0229 Other postherpetic nervous system involvement: Secondary | ICD-10-CM | POA: Diagnosis not present

## 2014-12-01 DIAGNOSIS — G2581 Restless legs syndrome: Secondary | ICD-10-CM | POA: Diagnosis not present

## 2014-12-01 DIAGNOSIS — S3210XD Unspecified fracture of sacrum, subsequent encounter for fracture with routine healing: Secondary | ICD-10-CM | POA: Diagnosis not present

## 2014-12-01 DIAGNOSIS — M1990 Unspecified osteoarthritis, unspecified site: Secondary | ICD-10-CM | POA: Diagnosis not present

## 2014-12-01 DIAGNOSIS — I10 Essential (primary) hypertension: Secondary | ICD-10-CM | POA: Diagnosis not present

## 2014-12-01 DIAGNOSIS — M8580 Other specified disorders of bone density and structure, unspecified site: Secondary | ICD-10-CM | POA: Diagnosis not present

## 2014-12-02 DIAGNOSIS — I10 Essential (primary) hypertension: Secondary | ICD-10-CM | POA: Diagnosis not present

## 2014-12-02 DIAGNOSIS — M8580 Other specified disorders of bone density and structure, unspecified site: Secondary | ICD-10-CM | POA: Diagnosis not present

## 2014-12-02 DIAGNOSIS — B0229 Other postherpetic nervous system involvement: Secondary | ICD-10-CM | POA: Diagnosis not present

## 2014-12-02 DIAGNOSIS — S3210XD Unspecified fracture of sacrum, subsequent encounter for fracture with routine healing: Secondary | ICD-10-CM | POA: Diagnosis not present

## 2014-12-02 DIAGNOSIS — M1990 Unspecified osteoarthritis, unspecified site: Secondary | ICD-10-CM | POA: Diagnosis not present

## 2014-12-02 DIAGNOSIS — G2581 Restless legs syndrome: Secondary | ICD-10-CM | POA: Diagnosis not present

## 2014-12-03 DIAGNOSIS — I10 Essential (primary) hypertension: Secondary | ICD-10-CM | POA: Diagnosis not present

## 2014-12-03 DIAGNOSIS — M1990 Unspecified osteoarthritis, unspecified site: Secondary | ICD-10-CM | POA: Diagnosis not present

## 2014-12-03 DIAGNOSIS — S3210XD Unspecified fracture of sacrum, subsequent encounter for fracture with routine healing: Secondary | ICD-10-CM | POA: Diagnosis not present

## 2014-12-03 DIAGNOSIS — M8580 Other specified disorders of bone density and structure, unspecified site: Secondary | ICD-10-CM | POA: Diagnosis not present

## 2014-12-03 DIAGNOSIS — G2581 Restless legs syndrome: Secondary | ICD-10-CM | POA: Diagnosis not present

## 2014-12-03 DIAGNOSIS — B0229 Other postherpetic nervous system involvement: Secondary | ICD-10-CM | POA: Diagnosis not present

## 2014-12-06 DIAGNOSIS — S3210XD Unspecified fracture of sacrum, subsequent encounter for fracture with routine healing: Secondary | ICD-10-CM | POA: Diagnosis not present

## 2014-12-06 DIAGNOSIS — I10 Essential (primary) hypertension: Secondary | ICD-10-CM | POA: Diagnosis not present

## 2014-12-06 DIAGNOSIS — G2581 Restless legs syndrome: Secondary | ICD-10-CM | POA: Diagnosis not present

## 2014-12-06 DIAGNOSIS — M8580 Other specified disorders of bone density and structure, unspecified site: Secondary | ICD-10-CM | POA: Diagnosis not present

## 2014-12-06 DIAGNOSIS — B0229 Other postherpetic nervous system involvement: Secondary | ICD-10-CM | POA: Diagnosis not present

## 2014-12-06 DIAGNOSIS — M1990 Unspecified osteoarthritis, unspecified site: Secondary | ICD-10-CM | POA: Diagnosis not present

## 2014-12-08 DIAGNOSIS — I10 Essential (primary) hypertension: Secondary | ICD-10-CM | POA: Diagnosis not present

## 2014-12-08 DIAGNOSIS — S3210XD Unspecified fracture of sacrum, subsequent encounter for fracture with routine healing: Secondary | ICD-10-CM | POA: Diagnosis not present

## 2014-12-08 DIAGNOSIS — G2581 Restless legs syndrome: Secondary | ICD-10-CM | POA: Diagnosis not present

## 2014-12-08 DIAGNOSIS — B0229 Other postherpetic nervous system involvement: Secondary | ICD-10-CM | POA: Diagnosis not present

## 2014-12-08 DIAGNOSIS — M1990 Unspecified osteoarthritis, unspecified site: Secondary | ICD-10-CM | POA: Diagnosis not present

## 2014-12-08 DIAGNOSIS — M8580 Other specified disorders of bone density and structure, unspecified site: Secondary | ICD-10-CM | POA: Diagnosis not present

## 2014-12-10 DIAGNOSIS — G2581 Restless legs syndrome: Secondary | ICD-10-CM | POA: Diagnosis not present

## 2014-12-10 DIAGNOSIS — M8580 Other specified disorders of bone density and structure, unspecified site: Secondary | ICD-10-CM | POA: Diagnosis not present

## 2014-12-10 DIAGNOSIS — S3210XD Unspecified fracture of sacrum, subsequent encounter for fracture with routine healing: Secondary | ICD-10-CM | POA: Diagnosis not present

## 2014-12-10 DIAGNOSIS — M1990 Unspecified osteoarthritis, unspecified site: Secondary | ICD-10-CM | POA: Diagnosis not present

## 2014-12-10 DIAGNOSIS — B0229 Other postherpetic nervous system involvement: Secondary | ICD-10-CM | POA: Diagnosis not present

## 2014-12-10 DIAGNOSIS — I10 Essential (primary) hypertension: Secondary | ICD-10-CM | POA: Diagnosis not present

## 2014-12-13 DIAGNOSIS — M1990 Unspecified osteoarthritis, unspecified site: Secondary | ICD-10-CM | POA: Diagnosis not present

## 2014-12-13 DIAGNOSIS — M8580 Other specified disorders of bone density and structure, unspecified site: Secondary | ICD-10-CM | POA: Diagnosis not present

## 2014-12-13 DIAGNOSIS — B0229 Other postherpetic nervous system involvement: Secondary | ICD-10-CM | POA: Diagnosis not present

## 2014-12-13 DIAGNOSIS — I10 Essential (primary) hypertension: Secondary | ICD-10-CM | POA: Diagnosis not present

## 2014-12-13 DIAGNOSIS — G2581 Restless legs syndrome: Secondary | ICD-10-CM | POA: Diagnosis not present

## 2014-12-13 DIAGNOSIS — S3210XD Unspecified fracture of sacrum, subsequent encounter for fracture with routine healing: Secondary | ICD-10-CM | POA: Diagnosis not present

## 2014-12-15 DIAGNOSIS — M8580 Other specified disorders of bone density and structure, unspecified site: Secondary | ICD-10-CM | POA: Diagnosis not present

## 2014-12-15 DIAGNOSIS — S3210XD Unspecified fracture of sacrum, subsequent encounter for fracture with routine healing: Secondary | ICD-10-CM | POA: Diagnosis not present

## 2014-12-15 DIAGNOSIS — G2581 Restless legs syndrome: Secondary | ICD-10-CM | POA: Diagnosis not present

## 2014-12-15 DIAGNOSIS — M1990 Unspecified osteoarthritis, unspecified site: Secondary | ICD-10-CM | POA: Diagnosis not present

## 2014-12-15 DIAGNOSIS — I10 Essential (primary) hypertension: Secondary | ICD-10-CM | POA: Diagnosis not present

## 2014-12-15 DIAGNOSIS — B0229 Other postherpetic nervous system involvement: Secondary | ICD-10-CM | POA: Diagnosis not present

## 2014-12-22 DIAGNOSIS — I10 Essential (primary) hypertension: Secondary | ICD-10-CM | POA: Diagnosis not present

## 2014-12-22 DIAGNOSIS — B0229 Other postherpetic nervous system involvement: Secondary | ICD-10-CM | POA: Diagnosis not present

## 2014-12-22 DIAGNOSIS — S3210XD Unspecified fracture of sacrum, subsequent encounter for fracture with routine healing: Secondary | ICD-10-CM | POA: Diagnosis not present

## 2014-12-22 DIAGNOSIS — G2581 Restless legs syndrome: Secondary | ICD-10-CM | POA: Diagnosis not present

## 2014-12-22 DIAGNOSIS — M1990 Unspecified osteoarthritis, unspecified site: Secondary | ICD-10-CM | POA: Diagnosis not present

## 2014-12-22 DIAGNOSIS — M8580 Other specified disorders of bone density and structure, unspecified site: Secondary | ICD-10-CM | POA: Diagnosis not present

## 2014-12-24 DIAGNOSIS — G2581 Restless legs syndrome: Secondary | ICD-10-CM | POA: Diagnosis not present

## 2014-12-24 DIAGNOSIS — B0229 Other postherpetic nervous system involvement: Secondary | ICD-10-CM | POA: Diagnosis not present

## 2014-12-24 DIAGNOSIS — M1990 Unspecified osteoarthritis, unspecified site: Secondary | ICD-10-CM | POA: Diagnosis not present

## 2014-12-24 DIAGNOSIS — I10 Essential (primary) hypertension: Secondary | ICD-10-CM | POA: Diagnosis not present

## 2014-12-24 DIAGNOSIS — S3210XD Unspecified fracture of sacrum, subsequent encounter for fracture with routine healing: Secondary | ICD-10-CM | POA: Diagnosis not present

## 2014-12-24 DIAGNOSIS — M8580 Other specified disorders of bone density and structure, unspecified site: Secondary | ICD-10-CM | POA: Diagnosis not present

## 2014-12-28 DIAGNOSIS — M1990 Unspecified osteoarthritis, unspecified site: Secondary | ICD-10-CM | POA: Diagnosis not present

## 2014-12-28 DIAGNOSIS — M8580 Other specified disorders of bone density and structure, unspecified site: Secondary | ICD-10-CM | POA: Diagnosis not present

## 2014-12-28 DIAGNOSIS — I10 Essential (primary) hypertension: Secondary | ICD-10-CM | POA: Diagnosis not present

## 2014-12-28 DIAGNOSIS — S3210XD Unspecified fracture of sacrum, subsequent encounter for fracture with routine healing: Secondary | ICD-10-CM | POA: Diagnosis not present

## 2014-12-28 DIAGNOSIS — B0229 Other postherpetic nervous system involvement: Secondary | ICD-10-CM | POA: Diagnosis not present

## 2014-12-28 DIAGNOSIS — G2581 Restless legs syndrome: Secondary | ICD-10-CM | POA: Diagnosis not present

## 2014-12-30 ENCOUNTER — Other Ambulatory Visit: Payer: Self-pay | Admitting: Family Medicine

## 2014-12-30 DIAGNOSIS — M8580 Other specified disorders of bone density and structure, unspecified site: Secondary | ICD-10-CM | POA: Diagnosis not present

## 2014-12-30 DIAGNOSIS — I10 Essential (primary) hypertension: Secondary | ICD-10-CM | POA: Diagnosis not present

## 2014-12-30 DIAGNOSIS — M1990 Unspecified osteoarthritis, unspecified site: Secondary | ICD-10-CM | POA: Diagnosis not present

## 2014-12-30 DIAGNOSIS — G2581 Restless legs syndrome: Secondary | ICD-10-CM | POA: Diagnosis not present

## 2014-12-30 DIAGNOSIS — B0229 Other postherpetic nervous system involvement: Secondary | ICD-10-CM | POA: Diagnosis not present

## 2014-12-30 DIAGNOSIS — S3210XD Unspecified fracture of sacrum, subsequent encounter for fracture with routine healing: Secondary | ICD-10-CM | POA: Diagnosis not present

## 2014-12-31 DIAGNOSIS — G2581 Restless legs syndrome: Secondary | ICD-10-CM | POA: Diagnosis not present

## 2014-12-31 DIAGNOSIS — M1990 Unspecified osteoarthritis, unspecified site: Secondary | ICD-10-CM | POA: Diagnosis not present

## 2014-12-31 DIAGNOSIS — B0229 Other postherpetic nervous system involvement: Secondary | ICD-10-CM | POA: Diagnosis not present

## 2014-12-31 DIAGNOSIS — S3210XD Unspecified fracture of sacrum, subsequent encounter for fracture with routine healing: Secondary | ICD-10-CM | POA: Diagnosis not present

## 2014-12-31 DIAGNOSIS — M8580 Other specified disorders of bone density and structure, unspecified site: Secondary | ICD-10-CM | POA: Diagnosis not present

## 2014-12-31 DIAGNOSIS — I10 Essential (primary) hypertension: Secondary | ICD-10-CM | POA: Diagnosis not present

## 2015-01-03 DIAGNOSIS — S3210XD Unspecified fracture of sacrum, subsequent encounter for fracture with routine healing: Secondary | ICD-10-CM | POA: Diagnosis not present

## 2015-01-03 DIAGNOSIS — I10 Essential (primary) hypertension: Secondary | ICD-10-CM | POA: Diagnosis not present

## 2015-01-03 DIAGNOSIS — M8580 Other specified disorders of bone density and structure, unspecified site: Secondary | ICD-10-CM | POA: Diagnosis not present

## 2015-01-03 DIAGNOSIS — M1990 Unspecified osteoarthritis, unspecified site: Secondary | ICD-10-CM | POA: Diagnosis not present

## 2015-01-03 DIAGNOSIS — G2581 Restless legs syndrome: Secondary | ICD-10-CM | POA: Diagnosis not present

## 2015-01-03 DIAGNOSIS — B0229 Other postherpetic nervous system involvement: Secondary | ICD-10-CM | POA: Diagnosis not present

## 2015-01-05 DIAGNOSIS — Z471 Aftercare following joint replacement surgery: Secondary | ICD-10-CM | POA: Diagnosis not present

## 2015-01-05 DIAGNOSIS — I10 Essential (primary) hypertension: Secondary | ICD-10-CM | POA: Diagnosis not present

## 2015-01-05 DIAGNOSIS — M1990 Unspecified osteoarthritis, unspecified site: Secondary | ICD-10-CM | POA: Diagnosis not present

## 2015-01-05 DIAGNOSIS — G2581 Restless legs syndrome: Secondary | ICD-10-CM | POA: Diagnosis not present

## 2015-01-05 DIAGNOSIS — S3210XD Unspecified fracture of sacrum, subsequent encounter for fracture with routine healing: Secondary | ICD-10-CM | POA: Diagnosis not present

## 2015-01-05 DIAGNOSIS — B0229 Other postherpetic nervous system involvement: Secondary | ICD-10-CM | POA: Diagnosis not present

## 2015-01-05 DIAGNOSIS — Z96641 Presence of right artificial hip joint: Secondary | ICD-10-CM | POA: Diagnosis not present

## 2015-01-05 DIAGNOSIS — M8580 Other specified disorders of bone density and structure, unspecified site: Secondary | ICD-10-CM | POA: Diagnosis not present

## 2015-01-12 DIAGNOSIS — B0229 Other postherpetic nervous system involvement: Secondary | ICD-10-CM | POA: Diagnosis not present

## 2015-01-12 DIAGNOSIS — S3210XD Unspecified fracture of sacrum, subsequent encounter for fracture with routine healing: Secondary | ICD-10-CM | POA: Diagnosis not present

## 2015-01-12 DIAGNOSIS — M1990 Unspecified osteoarthritis, unspecified site: Secondary | ICD-10-CM | POA: Diagnosis not present

## 2015-01-12 DIAGNOSIS — G2581 Restless legs syndrome: Secondary | ICD-10-CM | POA: Diagnosis not present

## 2015-01-12 DIAGNOSIS — I10 Essential (primary) hypertension: Secondary | ICD-10-CM | POA: Diagnosis not present

## 2015-01-12 DIAGNOSIS — M8580 Other specified disorders of bone density and structure, unspecified site: Secondary | ICD-10-CM | POA: Diagnosis not present

## 2015-01-13 ENCOUNTER — Encounter: Payer: Self-pay | Admitting: Family Medicine

## 2015-01-13 ENCOUNTER — Ambulatory Visit (INDEPENDENT_AMBULATORY_CARE_PROVIDER_SITE_OTHER): Payer: Medicare Other | Admitting: Family Medicine

## 2015-01-13 VITALS — BP 130/78 | HR 60 | Temp 97.6°F | Wt 160.0 lb

## 2015-01-13 DIAGNOSIS — E038 Other specified hypothyroidism: Secondary | ICD-10-CM

## 2015-01-13 DIAGNOSIS — M859 Disorder of bone density and structure, unspecified: Secondary | ICD-10-CM | POA: Diagnosis not present

## 2015-01-13 DIAGNOSIS — G2581 Restless legs syndrome: Secondary | ICD-10-CM | POA: Insufficient documentation

## 2015-01-13 DIAGNOSIS — H3531 Nonexudative age-related macular degeneration: Secondary | ICD-10-CM | POA: Diagnosis not present

## 2015-01-13 DIAGNOSIS — I10 Essential (primary) hypertension: Secondary | ICD-10-CM

## 2015-01-13 DIAGNOSIS — M8580 Other specified disorders of bone density and structure, unspecified site: Secondary | ICD-10-CM

## 2015-01-13 LAB — BASIC METABOLIC PANEL
BUN: 14 mg/dL (ref 6–23)
CO2: 25 meq/L (ref 19–32)
Calcium: 8.8 mg/dL (ref 8.4–10.5)
Chloride: 96 mEq/L (ref 96–112)
Creatinine, Ser: 0.59 mg/dL (ref 0.40–1.20)
GFR: 103.52 mL/min (ref >60.00)
GLUCOSE: 98 mg/dL (ref 70–99)
Potassium: 3.7 mEq/L (ref 3.5–5.1)
SODIUM: 127 meq/L — AB (ref 135–145)

## 2015-01-13 LAB — TSH: TSH: 4.27 u[IU]/mL (ref 0.35–4.50)

## 2015-01-13 LAB — VITAMIN D 25 HYDROXY (VIT D DEFICIENCY, FRACTURES): VITD: 30.49 ng/mL (ref 30.00–100.00)

## 2015-01-13 NOTE — Progress Notes (Signed)
Pre visit review using our clinic review tool, if applicable. No additional management support is needed unless otherwise documented below in the visit note. 

## 2015-01-13 NOTE — Progress Notes (Signed)
Subjective:    Patient ID: Carolyn Henderson, female    DOB: Apr 25, 1930, 81 y.o.   MRN: [id number]  HPI  Follow-up for multiple medical problems. Patient had right hip arthroplasty back in October. She then had a subsequent fall in December and ended up being admitted and eventual rehabilitation for sacral fracture as well as pelvic fracture involving ischium.  Patient has had extensive home physical therapy and is doing well with recovery. During rehabilitation stay it was discovered that she probably has some restless leg syndrome. She had never mentioned this previously. She was started on Requip and is done very well with that at night.  History of osteopenia by DEXA scan over 2 years ago. Takes calcium and vitamin D. She's never been treated with bisphosphonate.  Hypothyroidism treated with Synthroid. Compliant with therapy. She had TSH of 6.6 during recent hospitalization.  Hypertension treated with low-dose amlodipine and HCTZ. She has history of chronic hyponatremia. No dizziness or syncope.  Past Medical History  Diagnosis Date  . Hypertension   . Hyperlipidemia   . Thyroid disease   . Arthritis   . Complication of anesthesia   . PONV (postoperative nausea and vomiting)   . Shortness of breath     WITH EXERTION  . Osteopenia   . Precancerous skin lesion   . Diverticulosis   . GERD (gastroesophageal reflux disease)     ONLY OCCASIONAL  . Difficulty sleeping     TAKES TYLENOL PM NIGHTLY  . History of shingles    Past Surgical History  Procedure Laterality Date  . Cataract extraction    . Total hip arthroplasty  1999 / 2007    BILATERAL  . Abdominal hysterectomy    . Lumbar laminectomy    . Tonsillectomy    . Colon surgery  1983    COLON RESECTION  . Back surgery    . Joint replacement      TOTAL KNEE 2012 / BIL TOTAL HIPS   . Total hip revision Right 08/02/2014    Procedure: RIGHT TOTAL HIP REVISION;  Surgeon: Mauri Pole, MD;  Location: [facility];  Service:  Orthopedics;  Laterality: Right;    reports that she quit smoking about 22 years ago. She does not have any smokeless tobacco history on file. She reports that she does not drink alcohol or use illicit drugs. family history includes Cancer in an other family member; Hypertension in an other family member. Allergies  Allergen Reactions  . Cephalosporins     Unknown reaction   . Codeine Sulfate Nausea And Vomiting  . Hydrocodone Itching and Other (See Comments)  . Tramadol Nausea Only  . Penicillins Rash     Review of Systems  Constitutional: Negative for fatigue and unexpected weight change.  Eyes: Negative for visual disturbance.  Respiratory: Negative for cough, chest tightness, shortness of breath and wheezing.   Cardiovascular: Negative for chest pain, palpitations and leg swelling.  Gastrointestinal: Negative for abdominal pain.  Genitourinary: Negative for dysuria.  Neurological: Negative for dizziness, seizures, syncope, weakness, light-headedness and headaches.  Psychiatric/Behavioral: Negative for dysphoric mood.       Objective:   Physical Exam  Constitutional: She is oriented to person, place, and time. She appears well-developed and well-nourished.  Neck: Neck supple. No thyromegaly present.  Cardiovascular: Normal rate and regular rhythm.   Pulmonary/Chest: Effort normal and breath sounds normal. No respiratory distress. She has no wheezes. She has no rales.  Musculoskeletal: She exhibits no edema.  Neurological:  She is alert and oriented to person, place, and time.  Psychiatric: She has a normal mood and affect. Her behavior is normal.          Assessment & Plan:  #1 history of osteopenia. Continue regular calcium and vitamin D. Check 25-hydroxy vitamin D level. Set up repeat DEXA scan. #2 hypertension stable and at goal. History of mild hyponatremia with thiazide. Recheck basic metabolic panel. If sodium significantly low consider discontinue HCTZ and  increase amlodipine #3 hypothyroidism. Recheck TSH  #4 restless leg syndrome. Continue Requip which seems to be working well. Reminder to avoid excessive caffeine-especially in afternoons and at night.

## 2015-01-14 ENCOUNTER — Other Ambulatory Visit: Payer: Self-pay

## 2015-01-14 DIAGNOSIS — M8580 Other specified disorders of bone density and structure, unspecified site: Secondary | ICD-10-CM | POA: Diagnosis not present

## 2015-01-14 DIAGNOSIS — B0229 Other postherpetic nervous system involvement: Secondary | ICD-10-CM | POA: Diagnosis not present

## 2015-01-14 DIAGNOSIS — M1990 Unspecified osteoarthritis, unspecified site: Secondary | ICD-10-CM | POA: Diagnosis not present

## 2015-01-14 DIAGNOSIS — G2581 Restless legs syndrome: Secondary | ICD-10-CM | POA: Diagnosis not present

## 2015-01-14 DIAGNOSIS — I10 Essential (primary) hypertension: Secondary | ICD-10-CM | POA: Diagnosis not present

## 2015-01-14 DIAGNOSIS — S3210XD Unspecified fracture of sacrum, subsequent encounter for fracture with routine healing: Secondary | ICD-10-CM | POA: Diagnosis not present

## 2015-01-14 MED ORDER — AMLODIPINE BESYLATE 5 MG PO TABS: 5.0000 mg | ORAL_TABLET | Freq: Every day | ORAL | Status: DC

## 2015-01-17 DIAGNOSIS — I10 Essential (primary) hypertension: Secondary | ICD-10-CM | POA: Diagnosis not present

## 2015-01-17 DIAGNOSIS — M8580 Other specified disorders of bone density and structure, unspecified site: Secondary | ICD-10-CM | POA: Diagnosis not present

## 2015-01-17 DIAGNOSIS — B0229 Other postherpetic nervous system involvement: Secondary | ICD-10-CM | POA: Diagnosis not present

## 2015-01-17 DIAGNOSIS — S3210XD Unspecified fracture of sacrum, subsequent encounter for fracture with routine healing: Secondary | ICD-10-CM | POA: Diagnosis not present

## 2015-01-17 DIAGNOSIS — M1990 Unspecified osteoarthritis, unspecified site: Secondary | ICD-10-CM | POA: Diagnosis not present

## 2015-01-17 DIAGNOSIS — G2581 Restless legs syndrome: Secondary | ICD-10-CM | POA: Diagnosis not present

## 2015-01-18 ENCOUNTER — Ambulatory Visit (INDEPENDENT_AMBULATORY_CARE_PROVIDER_SITE_OTHER)
Admission: RE | Admit: 2015-01-18 | Discharge: 2015-01-18 | Disposition: A | Discharge status: Home / Self Care | Payer: Medicare Other | Source: Ambulatory Visit | Attending: Family Medicine | Admitting: Family Medicine

## 2015-01-18 DIAGNOSIS — M8580 Other specified disorders of bone density and structure, unspecified site: Secondary | ICD-10-CM | POA: Diagnosis not present

## 2015-01-19 DIAGNOSIS — B0229 Other postherpetic nervous system involvement: Secondary | ICD-10-CM | POA: Diagnosis not present

## 2015-01-19 DIAGNOSIS — M1990 Unspecified osteoarthritis, unspecified site: Secondary | ICD-10-CM | POA: Diagnosis not present

## 2015-01-19 DIAGNOSIS — G2581 Restless legs syndrome: Secondary | ICD-10-CM | POA: Diagnosis not present

## 2015-01-19 DIAGNOSIS — S3210XD Unspecified fracture of sacrum, subsequent encounter for fracture with routine healing: Secondary | ICD-10-CM | POA: Diagnosis not present

## 2015-01-19 DIAGNOSIS — M8580 Other specified disorders of bone density and structure, unspecified site: Secondary | ICD-10-CM | POA: Diagnosis not present

## 2015-01-19 DIAGNOSIS — I10 Essential (primary) hypertension: Secondary | ICD-10-CM | POA: Diagnosis not present

## 2015-01-26 ENCOUNTER — Other Ambulatory Visit: Payer: Self-pay | Admitting: Family Medicine

## 2015-02-03 ENCOUNTER — Telehealth: Payer: Self-pay | Admitting: Family Medicine

## 2015-02-03 MED ORDER — FUROSEMIDE 20 MG PO TABS: 20.0000 mg | ORAL_TABLET | Freq: Every day | ORAL | Status: DC

## 2015-02-03 NOTE — Telephone Encounter (Signed)
Pt called to say that Dr Elease Hashimoto took her off the following med  hydrochlorothiazide (HYDRODIURIL) 25 MG tablet and increased her amLODipine (NORVASC) 5 MG tablet  Pt called to say that her feet and ankle are swelling. Would like a call back

## 2015-02-03 NOTE — Telephone Encounter (Signed)
Pt informed. Medication sent to pharmacy

## 2015-02-03 NOTE — Telephone Encounter (Signed)
We had to stop HCTZ because of very low sodium.  Take HCTZ off med list.  Add low dose Furosemide 20 mg once daily and office follow-up one week.  High K diet.

## 2015-02-10 ENCOUNTER — Encounter: Payer: Self-pay | Admitting: Family Medicine

## 2015-02-10 ENCOUNTER — Ambulatory Visit (INDEPENDENT_AMBULATORY_CARE_PROVIDER_SITE_OTHER): Payer: Medicare Other | Admitting: Family Medicine

## 2015-02-10 VITALS — BP 120/70 | HR 82 | Temp 97.4°F | Wt 155.0 lb

## 2015-02-10 DIAGNOSIS — R600 Localized edema: Secondary | ICD-10-CM | POA: Diagnosis not present

## 2015-02-10 DIAGNOSIS — I10 Essential (primary) hypertension: Secondary | ICD-10-CM

## 2015-02-10 DIAGNOSIS — E871 Hypo-osmolality and hyponatremia: Secondary | ICD-10-CM

## 2015-02-10 LAB — BASIC METABOLIC PANEL
BUN: 15 mg/dL (ref 6–23)
CALCIUM: 8.7 mg/dL (ref 8.4–10.5)
CO2: 27 mEq/L (ref 19–32)
Chloride: 100 mEq/L (ref 96–112)
Creatinine, Ser: 0.62 mg/dL (ref 0.40–1.20)
GFR: 97.75 mL/min (ref >60.00)
Glucose, Bld: 82 mg/dL (ref 70–99)
Potassium: 4.6 mEq/L (ref 3.5–5.1)
Sodium: 131 mEq/L — ABNORMAL LOW (ref 135–145)

## 2015-02-10 NOTE — Progress Notes (Signed)
Pre visit review using our clinic review tool, if applicable. No additional management support is needed unless otherwise documented below in the visit note. 

## 2015-02-10 NOTE — Patient Instructions (Signed)
Go ahead and increase the Furosemide to 40 mg every M, W, Friday, and 20 mg all other days Elevated legs frequently Consider compression garments when up on feet a lot at home.

## 2015-02-10 NOTE — Progress Notes (Signed)
Subjective:    Patient ID: Carolyn Henderson, female    DOB: [birth year], 81 y.o.   MRN: [id number]  HPI Patient seen recently with hyponatremia with sodium 127. We discontinued her HCTZ and increase her amlodipine to 5 mg daily. She has unfortunately had some mild lower extremity edema which is worse late in the day and better early morning. She's not had any orthostatic symptoms. No other change of medications. No dietary changes. She's on furosemide 20 mg daily. No history of CHF. She also takes gabapentin which she has been on following severe case of postherpetic neuralgia. Her weight has been stable.  Past Medical History  Diagnosis Date  . Hypertension   . Hyperlipidemia   . Thyroid disease   . Arthritis   . Complication of anesthesia   . PONV (postoperative nausea and vomiting)   . Shortness of breath     WITH EXERTION  . Osteopenia   . Precancerous skin lesion   . Diverticulosis   . GERD (gastroesophageal reflux disease)     ONLY OCCASIONAL  . Difficulty sleeping     TAKES TYLENOL PM NIGHTLY  . History of shingles    Past Surgical History  Procedure Laterality Date  . Cataract extraction    . Total hip arthroplasty  1999 / 2007    BILATERAL  . Abdominal hysterectomy    . Lumbar laminectomy    . Tonsillectomy    . Colon surgery  1983    COLON RESECTION  . Back surgery    . Joint replacement      TOTAL KNEE 2012 / BIL TOTAL HIPS   . Total hip revision Right 08/02/2014    Procedure: RIGHT TOTAL HIP REVISION;  Surgeon: Mauri Pole, MD;  Location: [facility];  Service: Orthopedics;  Laterality: Right;    reports that she quit smoking about 22 years ago. She does not have any smokeless tobacco history on file. She reports that she does not drink alcohol or use illicit drugs. family history includes Cancer in an other family member; Hypertension in an other family member. Allergies  Allergen Reactions  . Cephalosporins     Unknown reaction   . Codeine Sulfate Nausea  And Vomiting  . Hydrocodone Itching and Other (See Comments)  . Tramadol Nausea Only  . Penicillins Rash      Review of Systems  Constitutional: Negative for fatigue and unexpected weight change.  Eyes: Negative for visual disturbance.  Respiratory: Negative for cough, chest tightness, shortness of breath and wheezing.   Cardiovascular: Negative for chest pain, palpitations and leg swelling.  Endocrine: Negative for polydipsia and polyuria.  Neurological: Negative for dizziness, seizures, syncope, weakness, light-headedness and headaches.       Objective:   Physical Exam  Constitutional: She appears well-developed and well-nourished. No distress.  Neck: Neck supple. No JVD present.  Cardiovascular: Normal rate and regular rhythm.   Pulmonary/Chest: Effort normal and breath sounds normal. No respiratory distress. She has no wheezes. She has no rales.  Musculoskeletal: She exhibits edema.  Trace edema legs bilaterally  Neurological: She is alert.          Assessment & Plan:  #1 hypertension. Stable and at goal. #2 recent hyponatremia likely related to hydrochlorothiazide. We discontinued this last visit and repeat basic metabolic panel today #3 bilateral leg edema. Likely multifactorial and related to amlodipine, possibly gabapentin, venous stasis, etc. Elevate legs frequently. Her symptoms are relatively mild. She will increase her furosemide to 40 mg  every Monday, Wednesday, and Friday and 20 mg all other days. We recommended daily weights. Compression garments-and she already has pair at home.

## 2015-02-11 ENCOUNTER — Other Ambulatory Visit: Payer: Self-pay

## 2015-02-11 MED ORDER — ROPINIROLE HCL 0.25 MG PO TABS: 0.2500 mg | ORAL_TABLET | Freq: Every day | ORAL | Status: DC

## 2015-02-17 ENCOUNTER — Ambulatory Visit: Payer: Medicare Other | Admitting: Family Medicine

## 2015-02-28 ENCOUNTER — Other Ambulatory Visit: Payer: Self-pay | Admitting: Family Medicine

## 2015-02-28 MED ORDER — FUROSEMIDE 20 MG PO TABS: ORAL_TABLET | ORAL | Status: DC

## 2015-02-28 NOTE — Telephone Encounter (Signed)
Done

## 2015-02-28 NOTE — Telephone Encounter (Signed)
Pt needs refill on furosemide 20 mg call into [facility]. Pt take 2(20 mg on Monday,wed,fridays) pt take only 1 (20 mg on tues,thurs,sat and sundays) pt needs enough for  30 day supply with refills

## 2015-04-11 ENCOUNTER — Other Ambulatory Visit: Payer: Self-pay | Admitting: Family Medicine

## 2015-04-12 ENCOUNTER — Other Ambulatory Visit: Payer: Self-pay | Admitting: Family Medicine

## 2015-04-20 ENCOUNTER — Ambulatory Visit (INDEPENDENT_AMBULATORY_CARE_PROVIDER_SITE_OTHER): Payer: Medicare Other | Admitting: Family Medicine

## 2015-04-20 ENCOUNTER — Encounter: Payer: Self-pay | Admitting: Family Medicine

## 2015-04-20 VITALS — BP 122/66 | HR 66 | Temp 97.8°F | Wt 158.0 lb

## 2015-04-20 DIAGNOSIS — I10 Essential (primary) hypertension: Secondary | ICD-10-CM

## 2015-04-20 MED ORDER — AMLODIPINE BESYLATE 2.5 MG PO TABS: 2.5000 mg | ORAL_TABLET | Freq: Every day | ORAL | Status: DC

## 2015-04-20 NOTE — Progress Notes (Signed)
   Subjective:    Patient ID: Carolyn Henderson, female    DOB: 1930-06-08, 81 y.o.   MRN: [id number]  HPI Follow-up regarding recent peripheral edema and hyponatremia. We discontinued hydrochlorothiazide and started low-dose furosemide. She's had occasional lightheadedness and had some recent physical therapy and blood pressures are slightly low. She has been alternating amlodipine 2.5 mg with 5 mg and dizziness is improved. Blood pressure stable by home readings. Edema improved, though her weight is up 3 pounds from last visit  Past Medical History  Diagnosis Date  . Hypertension   . Hyperlipidemia   . Thyroid disease   . Arthritis   . Complication of anesthesia   . PONV (postoperative nausea and vomiting)   . Shortness of breath     WITH EXERTION  . Osteopenia   . Precancerous skin lesion   . Diverticulosis   . GERD (gastroesophageal reflux disease)     ONLY OCCASIONAL  . Difficulty sleeping     TAKES TYLENOL PM NIGHTLY  . History of shingles    Past Surgical History  Procedure Laterality Date  . Cataract extraction    . Total hip arthroplasty  1999 / 2007    BILATERAL  . Abdominal hysterectomy    . Lumbar laminectomy    . Tonsillectomy    . Colon surgery  1983    COLON RESECTION  . Back surgery    . Joint replacement      TOTAL KNEE 2012 / BIL TOTAL HIPS   . Total hip revision Right 08/02/2014    Procedure: RIGHT TOTAL HIP REVISION;  Surgeon: Mauri Pole, MD;  Location: [facility];  Service: Orthopedics;  Laterality: Right;    reports that she quit smoking about 22 years ago. She does not have any smokeless tobacco history on file. She reports that she does not drink alcohol or use illicit drugs. family history includes Cancer in an other family member; Hypertension in an other family member. Allergies  Allergen Reactions  . Cephalosporins     Unknown reaction   . Codeine Sulfate Nausea And Vomiting  . Hydrocodone Itching and Other (See Comments)  . Tramadol Nausea  Only  . Penicillins Rash      Review of Systems  Constitutional: Negative for fatigue and unexpected weight change.  Eyes: Negative for visual disturbance.  Respiratory: Negative for cough, chest tightness, shortness of breath and wheezing.   Cardiovascular: Negative for chest pain, palpitations and leg swelling.  Neurological: Negative for dizziness, seizures, syncope, weakness, light-headedness and headaches.       Objective:   Physical Exam  Constitutional: She appears well-developed and well-nourished.  Neck: No JVD present.  Cardiovascular: Normal rate and regular rhythm.   Pulmonary/Chest: Effort normal and breath sounds normal. No respiratory distress. She has no wheezes. She has no rales.  Musculoskeletal:  Minimal, trace edema legs bilaterally          Assessment & Plan:  Hypertension. Stable and at goal. Blood pressure systolic sitting 122 and standing 120. Reduce amlodipine from 5 mg a 2.5 mg daily. Continue to monitor and be in touch if consistent blood pressure readings over 150/90.

## 2015-04-20 NOTE — Patient Instructions (Signed)
Monitor blood pressure and be in touch if consistently > 150/90 

## 2015-04-20 NOTE — Progress Notes (Signed)
Pre visit review using our clinic review tool, if applicable. No additional management support is needed unless otherwise documented below in the visit note. 

## 2015-06-29 DIAGNOSIS — H52203 Unspecified astigmatism, bilateral: Secondary | ICD-10-CM | POA: Diagnosis not present

## 2015-06-29 DIAGNOSIS — H3531 Nonexudative age-related macular degeneration: Secondary | ICD-10-CM | POA: Diagnosis not present

## 2015-06-29 DIAGNOSIS — Z961 Presence of intraocular lens: Secondary | ICD-10-CM | POA: Diagnosis not present

## 2015-07-14 ENCOUNTER — Ambulatory Visit: Payer: Medicare Other | Admitting: Family Medicine

## 2015-07-15 ENCOUNTER — Ambulatory Visit: Payer: Medicare Other | Admitting: Family Medicine

## 2015-07-18 ENCOUNTER — Ambulatory Visit (INDEPENDENT_AMBULATORY_CARE_PROVIDER_SITE_OTHER): Payer: Medicare Other | Admitting: Family Medicine

## 2015-07-18 ENCOUNTER — Encounter: Payer: Self-pay | Admitting: Family Medicine

## 2015-07-18 VITALS — BP 138/70 | HR 73 | Temp 98.0°F | Ht 63.5 in | Wt 156.6 lb

## 2015-07-18 DIAGNOSIS — E038 Other specified hypothyroidism: Secondary | ICD-10-CM | POA: Diagnosis not present

## 2015-07-18 DIAGNOSIS — I10 Essential (primary) hypertension: Secondary | ICD-10-CM | POA: Diagnosis not present

## 2015-07-18 DIAGNOSIS — G2581 Restless legs syndrome: Secondary | ICD-10-CM

## 2015-07-18 DIAGNOSIS — Z23 Encounter for immunization: Secondary | ICD-10-CM | POA: Diagnosis not present

## 2015-07-18 NOTE — Progress Notes (Signed)
Subjective:    Patient ID: Carolyn Henderson, female    DOB: 1930/03/20, 81 y.o.   MRN: [id number]  HPI Medical follow-up  Hypertension. We recently reduced her dose of amlodipine. Edema is improved. Rarely taking furosemide. She had hyponatremia on HCTZ. She brings in copy of blood pressure readings and these have been consistently well controlled by nurse where she is staying at assisted living  Restless leg syndrome. She takes Mirapex 0.25 mg once daily. Recently had some breakthrough symptoms. Avoids caffeine late in the day. No history of anemia.  History of hypothyroidism. She was stable and at goal when checked last spring. Compliant with therapy. No fatigue issues. Appetite and weight are stable  Past Medical History  Diagnosis Date  . Hypertension   . Hyperlipidemia   . Thyroid disease   . Arthritis   . Complication of anesthesia   . PONV (postoperative nausea and vomiting)   . Shortness of breath     WITH EXERTION  . Osteopenia   . Precancerous skin lesion   . Diverticulosis   . GERD (gastroesophageal reflux disease)     ONLY OCCASIONAL  . Difficulty sleeping     TAKES TYLENOL PM NIGHTLY  . History of shingles    Past Surgical History  Procedure Laterality Date  . Cataract extraction    . Total hip arthroplasty  1999 / 2007    BILATERAL  . Abdominal hysterectomy    . Lumbar laminectomy    . Tonsillectomy    . Colon surgery  1983    COLON RESECTION  . Back surgery    . Joint replacement      TOTAL KNEE 2012 / BIL TOTAL HIPS   . Total hip revision Right 08/02/2014    Procedure: RIGHT TOTAL HIP REVISION;  Surgeon: Mauri Pole, MD;  Location: [facility];  Service: Orthopedics;  Laterality: Right;    reports that she quit smoking about 23 years ago. She does not have any smokeless tobacco history on file. She reports that she does not drink alcohol or use illicit drugs. family history includes Cancer in an other family member; Hypertension in an other family  member. Allergies  Allergen Reactions  . Cephalosporins     Unknown reaction   . Codeine Sulfate Nausea And Vomiting  . Hydrocodone Itching and Other (See Comments)  . Tramadol Nausea Only  . Penicillins Rash      Review of Systems  Constitutional: Negative for fatigue.  Eyes: Negative for visual disturbance.  Respiratory: Negative for cough, chest tightness, shortness of breath and wheezing.   Cardiovascular: Negative for chest pain, palpitations and leg swelling.  Endocrine: Negative for polydipsia and polyuria.  Musculoskeletal: Positive for arthralgias.  Neurological: Negative for dizziness, seizures, syncope, weakness, light-headedness and headaches.       Objective:   Physical Exam  Constitutional: She appears well-developed and well-nourished.  Neck: Neck supple. No thyromegaly present.  Cardiovascular: Normal rate and regular rhythm.   Pulmonary/Chest: Effort normal and breath sounds normal. No respiratory distress. She has no wheezes. She has no rales.  Musculoskeletal: She exhibits no edema.  Psychiatric: She has a normal mood and affect. Her behavior is normal.          Assessment & Plan:  #1 hypertension stable and at goal. Continue current medications. Continue close monitoring #2 restless leg syndrome. Breakthrough symptoms on Mirapex 0.25 mg at night. Increase to 1-1/2 tablets at night #3 hypothyroidism. Continue levothyroxine. Recheck TSH follow-up 6 months #  4 health maintenance. Flu vaccine given

## 2015-07-18 NOTE — Progress Notes (Signed)
Pre visit review using our clinic review tool, if applicable. No additional management support is needed unless otherwise documented below in the visit note. 

## 2015-07-18 NOTE — Patient Instructions (Signed)
Consider increasing your Mirapex to one and one half tablet at night.

## 2015-07-28 DIAGNOSIS — H43813 Vitreous degeneration, bilateral: Secondary | ICD-10-CM | POA: Diagnosis not present

## 2015-07-28 DIAGNOSIS — H353132 Nonexudative age-related macular degeneration, bilateral, intermediate dry stage: Secondary | ICD-10-CM | POA: Diagnosis not present

## 2015-08-03 DIAGNOSIS — Z471 Aftercare following joint replacement surgery: Secondary | ICD-10-CM | POA: Diagnosis not present

## 2015-08-03 DIAGNOSIS — Z96641 Presence of right artificial hip joint: Secondary | ICD-10-CM | POA: Diagnosis not present

## 2015-08-03 DIAGNOSIS — M7061 Trochanteric bursitis, right hip: Secondary | ICD-10-CM | POA: Diagnosis not present

## 2015-08-03 DIAGNOSIS — M25551 Pain in right hip: Secondary | ICD-10-CM | POA: Diagnosis not present

## 2015-08-15 DIAGNOSIS — M25551 Pain in right hip: Secondary | ICD-10-CM | POA: Diagnosis not present

## 2015-08-15 DIAGNOSIS — R278 Other lack of coordination: Secondary | ICD-10-CM | POA: Diagnosis not present

## 2015-08-16 DIAGNOSIS — R278 Other lack of coordination: Secondary | ICD-10-CM | POA: Diagnosis not present

## 2015-08-16 DIAGNOSIS — M25551 Pain in right hip: Secondary | ICD-10-CM | POA: Diagnosis not present

## 2015-08-18 ENCOUNTER — Other Ambulatory Visit: Payer: Self-pay | Admitting: *Deleted

## 2015-08-18 MED ORDER — GABAPENTIN 300 MG PO CAPS: ORAL_CAPSULE | ORAL | Status: AC

## 2015-08-18 MED ORDER — SYNTHROID 125 MCG PO TABS
ORAL_TABLET | ORAL | Status: DC
Start: 2015-08-18 — End: 2015-10-27

## 2015-08-18 MED ORDER — AMLODIPINE BESYLATE 2.5 MG PO TABS: 2.5000 mg | ORAL_TABLET | Freq: Every day | ORAL | Status: DC

## 2015-08-18 MED ORDER — ROPINIROLE HCL 0.25 MG PO TABS: 0.2500 mg | ORAL_TABLET | Freq: Every day | ORAL | Status: DC

## 2015-09-01 ENCOUNTER — Other Ambulatory Visit: Payer: Self-pay | Admitting: Family Medicine

## 2015-09-07 DIAGNOSIS — M25551 Pain in right hip: Secondary | ICD-10-CM | POA: Diagnosis not present

## 2015-09-07 DIAGNOSIS — M7061 Trochanteric bursitis, right hip: Secondary | ICD-10-CM | POA: Diagnosis not present

## 2015-09-07 DIAGNOSIS — Z96641 Presence of right artificial hip joint: Secondary | ICD-10-CM | POA: Diagnosis not present

## 2015-09-16 DIAGNOSIS — M25551 Pain in right hip: Secondary | ICD-10-CM | POA: Diagnosis not present

## 2015-09-16 DIAGNOSIS — R278 Other lack of coordination: Secondary | ICD-10-CM | POA: Diagnosis not present

## 2015-09-19 DIAGNOSIS — R278 Other lack of coordination: Secondary | ICD-10-CM | POA: Diagnosis not present

## 2015-09-19 DIAGNOSIS — M25551 Pain in right hip: Secondary | ICD-10-CM | POA: Diagnosis not present

## 2015-10-11 ENCOUNTER — Other Ambulatory Visit: Payer: Self-pay | Admitting: Family Medicine

## 2015-10-18 ENCOUNTER — Other Ambulatory Visit: Payer: Self-pay | Admitting: Family Medicine

## 2015-10-18 MED ORDER — AMLODIPINE BESYLATE 2.5 MG PO TABS: 2.5000 mg | ORAL_TABLET | Freq: Every day | ORAL | Status: DC

## 2015-10-21 ENCOUNTER — Other Ambulatory Visit: Payer: Self-pay | Admitting: *Deleted

## 2015-10-21 MED ORDER — AMLODIPINE BESYLATE 2.5 MG PO TABS: 2.5000 mg | ORAL_TABLET | Freq: Every day | ORAL | Status: AC

## 2015-10-27 ENCOUNTER — Ambulatory Visit (INDEPENDENT_AMBULATORY_CARE_PROVIDER_SITE_OTHER): Payer: Medicare Other | Admitting: Family Medicine

## 2015-10-27 ENCOUNTER — Encounter: Payer: Self-pay | Admitting: Family Medicine

## 2015-10-27 VITALS — BP 128/80 | HR 78 | Temp 98.0°F | Ht 63.5 in | Wt 158.1 lb

## 2015-10-27 DIAGNOSIS — E038 Other specified hypothyroidism: Secondary | ICD-10-CM

## 2015-10-27 DIAGNOSIS — K130 Diseases of lips: Secondary | ICD-10-CM

## 2015-10-27 DIAGNOSIS — I10 Essential (primary) hypertension: Secondary | ICD-10-CM | POA: Diagnosis not present

## 2015-10-27 DIAGNOSIS — R208 Other disturbances of skin sensation: Secondary | ICD-10-CM | POA: Diagnosis not present

## 2015-10-27 DIAGNOSIS — M5431 Sciatica, right side: Secondary | ICD-10-CM

## 2015-10-27 LAB — BASIC METABOLIC PANEL
BUN: 23 mg/dL (ref 6–23)
CALCIUM: 9.2 mg/dL (ref 8.4–10.5)
CO2: 29 meq/L (ref 19–32)
Chloride: 99 mEq/L (ref 96–112)
Creatinine, Ser: 0.68 mg/dL (ref 0.40–1.20)
GFR: 87.71 mL/min (ref >60.00)
Glucose, Bld: 94 mg/dL (ref 70–99)
Potassium: 4.9 mEq/L (ref 3.5–5.1)
SODIUM: 133 meq/L — AB (ref 135–145)

## 2015-10-27 LAB — TSH: TSH: 3.29 u[IU]/mL (ref 0.35–4.50)

## 2015-10-27 LAB — VITAMIN B12: VITAMIN B 12: 833 pg/mL (ref 211–911)

## 2015-10-27 MED ORDER — CLOTRIMAZOLE-BETAMETHASONE 1-0.05 % EX CREA: 1.0000 "application " | TOPICAL_CREAM | Freq: Two times a day (BID) | CUTANEOUS | Status: AC

## 2015-10-27 NOTE — Patient Instructions (Signed)
Make sure your toothpaste does not have any sodium laurel sulfate. May want to back off using the mouth wash with Listerine for now.   Touch base for any right lower extremity weakness or numbness or progressive low back pain.

## 2015-10-27 NOTE — Progress Notes (Signed)
Subjective:    Patient ID: MHIA MATHRE, female    DOB: [birth year], 82 y.o.   MRN: [id number]  HPI Here for several issues  2 week history of itching and rash corners of the mouth bilaterally. Does not have any dentures. Has not tried anything topically. Nonpainful.   Describes different issue which is some diffuse burning in her mouth past couple of weeks. No changes of toothpaste. She is not sure what type of toothepaste she is using. Not sure if this contains sodium laurel sulfate. She's not seen any visible rash. No difficulties with swallowing. Takes B12 orally as a supplement.  Aggravated by Listerine mouthwash    Right lower extremity pain radiating from her lumbar back with onset last Friday night. Denies recent fall or injury. She's had some chronic low back pain. Symptoms are actually improved today but not resolved. She described a dull achy pain radiating from her lower lumbar area all way to the foot. No weakness or numbness. No urine or stool incontinence. Ibuprofen helps slightly. Previous right hip arthroplasty and is also had previous lumbar disc surgery remotely   Hypertension has been stable.  Home readings reviewed and consistently < 140/90.    Past Medical History  Diagnosis Date  . Hypertension   . Hyperlipidemia   . Thyroid disease   . Arthritis   . Complication of anesthesia   . PONV (postoperative nausea and vomiting)   . Shortness of breath     WITH EXERTION  . Osteopenia   . Precancerous skin lesion   . Diverticulosis   . GERD (gastroesophageal reflux disease)     ONLY OCCASIONAL  . Difficulty sleeping     TAKES TYLENOL PM NIGHTLY  . History of shingles    Past Surgical History  Procedure Laterality Date  . Cataract extraction    . Total hip arthroplasty  1999 / 2007    BILATERAL  . Abdominal hysterectomy    . Lumbar laminectomy    . Tonsillectomy    . Colon surgery  1983    COLON RESECTION  . Back surgery    . Joint replacement      TOTAL  KNEE 2012 / BIL TOTAL HIPS   . Total hip revision Right 08/02/2014    Procedure: RIGHT TOTAL HIP REVISION;  Surgeon: Mauri Pole, MD;  Location: [facility];  Service: Orthopedics;  Laterality: Right;    reports that she quit smoking about 23 years ago. She does not have any smokeless tobacco history on file. She reports that she does not drink alcohol or use illicit drugs. family history is not on file. Allergies  Allergen Reactions  . Cephalosporins     Unknown reaction   . Codeine Sulfate Nausea And Vomiting  . Hydrocodone Itching and Other (See Comments)  . Tramadol Nausea Only  . Penicillins Rash        Review of Systems  Constitutional: Negative for fatigue.  Eyes: Negative for visual disturbance.  Respiratory: Negative for cough, chest tightness, shortness of breath and wheezing.   Cardiovascular: Negative for chest pain, palpitations and leg swelling.  Musculoskeletal: Positive for back pain. Negative for joint swelling.  Neurological: Negative for dizziness, seizures, syncope, weakness, light-headedness and headaches.  Hematological: Negative for adenopathy.       Objective:   Physical Exam  Constitutional: She appears well-developed and well-nourished. No distress.  HENT:  Right Ear: External ear normal.  Left Ear: External ear normal.  Oropharynx is clear. No evidence  for thrush. She does have some minimal erythema and minimal fissures at the corners of the mouth bilaterally consistent with angular cheilitis.  Cardiovascular: Normal rate and regular rhythm.   Pulmonary/Chest: Effort normal and breath sounds normal. No respiratory distress. She has no wheezes. She has no rales.  Musculoskeletal: She exhibits no edema.  Straight leg raise is negative on the right  Neurological:  Full-strength with plantar flexion, dorsiflexion, and knee extension on the right and left side. She has symmetric reflexes. No sensory deficits.          Assessment & Plan:    #1  burning mouth symptoms. Unremarkable exam other than below. No evidence for thrush. Check B12 level. Make sure her toothpaste does not contain sodium laurel sulfate. Avoid Listerine for now. Symptoms are relatively mild. Observe if B12 normal.   #2 angular cheilitis- try Lotrisone twice daily to affected area. Touch base in 2 weeks if not improving   #3 right lower extremity pain. Symptoms suggest sciatica but her neuro exam is nonfocal. Symptoms are improved compared to last Friday. Observe for now. May need further evaluation with MRI if symptoms persist or worsen. Follow-up promptly for any weakness or numbness or worsening pain   #4 hypertension.  Stable and at goal.  #5 hypothyroidism.  Repeat TSH.

## 2015-11-03 ENCOUNTER — Telehealth: Payer: Self-pay | Admitting: Family Medicine

## 2015-11-03 DIAGNOSIS — M5416 Radiculopathy, lumbar region: Secondary | ICD-10-CM

## 2015-11-03 NOTE — Telephone Encounter (Signed)
This was discussed at her recent [place] on 10/27/15. Should she give her symptoms more time to subside or go ahead with MRI? If MRI okay please specify. Thanks.

## 2015-11-03 NOTE — Telephone Encounter (Signed)
IF she has no severe pain or any weakness, I would give this another couple of weeks.

## 2015-11-03 NOTE — Telephone Encounter (Signed)
Pt call said she still having leg issues. She said Dr Elease Hashimoto told her if she was still having leg issues he would schedule her for an MRI

## 2015-11-04 NOTE — Telephone Encounter (Signed)
Pt reports severe, worsening pain with weakness. In order for patient to walk to either has to use her cane or have her husband help due to her sx. Please advise.

## 2015-11-04 NOTE — Addendum Note (Signed)
Addended by: Eulas Post on: 11/04/2015 08:33 AM   Modules accepted: Orders

## 2015-11-04 NOTE — Telephone Encounter (Signed)
Patient notified that MRI has been ordered and that she should be receiving a phone call soon to get that scheduled. Thanks!

## 2015-11-04 NOTE — Telephone Encounter (Signed)
I have ordered MRI of lumbar spine.  Let pt know  thx!

## 2015-11-14 ENCOUNTER — Ambulatory Visit
Admission: RE | Admit: 2015-11-14 | Discharge: 2015-11-14 | Disposition: A | Discharge status: Home / Self Care | Payer: Medicare Other | Source: Ambulatory Visit | Attending: Family Medicine | Admitting: Family Medicine

## 2015-11-14 DIAGNOSIS — M5416 Radiculopathy, lumbar region: Secondary | ICD-10-CM

## 2015-11-14 DIAGNOSIS — M4806 Spinal stenosis, lumbar region: Secondary | ICD-10-CM | POA: Diagnosis not present

## 2015-11-17 ENCOUNTER — Telehealth: Payer: Self-pay | Admitting: Family Medicine

## 2015-11-17 ENCOUNTER — Other Ambulatory Visit: Payer: Self-pay | Admitting: Family Medicine

## 2015-11-17 DIAGNOSIS — M48061 Spinal stenosis, lumbar region without neurogenic claudication: Secondary | ICD-10-CM

## 2015-11-17 NOTE — Telephone Encounter (Signed)
See result note.  

## 2015-11-17 NOTE — Telephone Encounter (Signed)
Pt said she would like a call back about her MRI

## 2015-11-19 ENCOUNTER — Encounter: Payer: Self-pay | Admitting: Family Medicine

## 2015-11-21 ENCOUNTER — Other Ambulatory Visit: Payer: Self-pay | Admitting: Family Medicine

## 2015-11-21 DIAGNOSIS — M48061 Spinal stenosis, lumbar region without neurogenic claudication: Secondary | ICD-10-CM

## 2015-11-21 MED ORDER — TRAMADOL HCL 50 MG PO TABS: 50.0000 mg | ORAL_TABLET | Freq: Four times a day (QID) | ORAL | Status: DC | PRN

## 2015-11-21 NOTE — Telephone Encounter (Signed)
Order entered for referral. Any other options for her at this time?

## 2015-11-30 ENCOUNTER — Telehealth: Payer: Self-pay | Admitting: Family Medicine

## 2015-11-30 NOTE — Telephone Encounter (Signed)
Pt said she call last week for some pain medication and the following med was called in traMADol (ULTRAM) 50 MG tablet    Pt said she is allergic to TRAMADOL and is asking if something else can be called in such as [place]

## 2015-11-30 NOTE — Telephone Encounter (Signed)
Cymbalta is an option.  D/C Tramadol  Start Cymbalta 30 mg po qd times one week and then if tolerating increase to 60 mg daily.  Recommend office follow up in 3 weeks.

## 2015-11-30 NOTE — Telephone Encounter (Signed)
Pt cannot take Tramadol due to nausea and upset stomach. Any other recommendations?

## 2015-12-01 ENCOUNTER — Other Ambulatory Visit: Payer: Self-pay | Admitting: Family Medicine

## 2015-12-01 MED ORDER — DULOXETINE HCL 30 MG PO CPEP: ORAL_CAPSULE | ORAL | Status: DC

## 2015-12-01 NOTE — Telephone Encounter (Signed)
Medication sent in for patient. She is following up with 2 different Neurologist on 3-15,3-17. She will call to schedule a follow up after meeting with Neuro.

## 2015-12-05 ENCOUNTER — Ambulatory Visit (INDEPENDENT_AMBULATORY_CARE_PROVIDER_SITE_OTHER): Payer: Medicare Other | Admitting: Family Medicine

## 2015-12-05 ENCOUNTER — Encounter: Payer: Self-pay | Admitting: Family Medicine

## 2015-12-05 VITALS — BP 118/82 | HR 79 | Temp 97.8°F | Wt 155.8 lb

## 2015-12-05 DIAGNOSIS — R42 Dizziness and giddiness: Secondary | ICD-10-CM | POA: Diagnosis not present

## 2015-12-05 NOTE — Patient Instructions (Addendum)
Please discontinue use of Benedryl at night for sleep.  Also, before getting out of bed, sit up slowly, rock your feet from heel to toe 10 times, and stand slowly. If symptoms do not improve in 3-4 days, worsen, or you develop new symptoms, please follow up with PCP for further evaluation.

## 2015-12-05 NOTE — Progress Notes (Addendum)
Subjective:    Patient ID: Carolyn Henderson, female    DOB: Sep 25, 1930, 82 y.o.   MRN: [id number]  HPI  [name] is an 82 year old female who presents today with lightheadedness and dizziness that occurs at times when rising from a seated position and ambulating. This symptom started 3 days ago and is not present at all times but can occur "several" times /day.  Associated symptom of nausea without vomiting was noted when first episode started, however patient is able to eat and drink at this time. Currently, no dizziness is noted and patient ate a poached egg and gravy prior to appointment without difficulty. Also, patient noted loose stools x 6 when the episode first started.  She denies fever, chills, sweats,sinus congestion/pressure, ear pain/pressure, headache, chest pain, arm pain, fatigue, visual disturbances, SOB, numbness, tingling, and recent antibiotic use. Of note, patient stated that she took 2 tablets of Benedryl just prior to the first episode because she was having difficulty sleeping.   She has continued taking Benedryl every night since symptoms have started.   Review of Systems  Constitutional: Negative for fever, chills and fatigue.  HENT: Negative for congestion, ear pain, postnasal drip, rhinorrhea and sinus pressure.   Respiratory: Negative for apnea, cough, shortness of breath and wheezing.   Cardiovascular: Negative for chest pain, palpitations and leg swelling.  Gastrointestinal: Positive for nausea and diarrhea. Negative for vomiting, abdominal pain and constipation.  Genitourinary: Negative for dysuria, urgency and hematuria.  Musculoskeletal: Negative for myalgias and arthralgias.  Skin: Negative for pallor and rash.  Neurological: Positive for dizziness. Negative for light-headedness and headaches.   Past Medical History  Diagnosis Date  . Hypertension   . Hyperlipidemia   . Thyroid disease   . Arthritis   . Complication of anesthesia   . PONV  (postoperative nausea and vomiting)   . Shortness of breath     WITH EXERTION  . Osteopenia   . Precancerous skin lesion   . Diverticulosis   . GERD (gastroesophageal reflux disease)     ONLY OCCASIONAL  . Difficulty sleeping     TAKES TYLENOL PM NIGHTLY  . History of shingles     Social History   Social History  . Marital Status: Married    Spouse Name: N/A  . Number of Children: N/A  . Years of Education: N/A   Occupational History  . Not on file.   Social History Main Topics  . Smoking status: Former Smoker    Quit date: 07/21/1992  . Smokeless tobacco: Not on file  . Alcohol Use: No  . Drug Use: No  . Sexual Activity: Not on file   Other Topics Concern  . Not on file   Social History Narrative    Past Surgical History  Procedure Laterality Date  . Cataract extraction    . Total hip arthroplasty  1999 / 2007    BILATERAL  . Abdominal hysterectomy    . Lumbar laminectomy    . Tonsillectomy    . Colon surgery  1983    COLON RESECTION  . Back surgery    . Joint replacement      TOTAL KNEE 2012 / BIL TOTAL HIPS   . Total hip revision Right 08/02/2014    Procedure: RIGHT TOTAL HIP REVISION;  Surgeon: Mauri Pole, MD;  Location: [facility];  Service: Orthopedics;  Laterality: Right;    Family History  Problem Relation Age of Onset  . Hypertension  fhx  . Cancer      prostate/fhx    Allergies  Allergen Reactions  . Cephalosporins     Unknown reaction   . Codeine Sulfate Nausea And Vomiting  . Hydrocodone Itching and Other (See Comments)  . Tramadol Nausea Only  . Penicillins Rash    Current Outpatient Prescriptions on File Prior to Visit  Medication Sig Dispense Refill  . acetaminophen (TYLENOL) 325 MG tablet Take 2 tablets (650 mg total) by mouth every 6 (six) hours as needed for mild pain (or Fever >/= 101).    Marland Kitchen amLODipine (NORVASC) 2.5 MG tablet Take 1 tablet (2.5 mg total) by mouth daily. 90 tablet 1  . Calcium Carbonate-Vit D-Min  (CALCIUM 1200 PO) Take by mouth.    . Carboxymethylcell-Hypromellose (GENTEAL OP) Apply 1 drop to eye at bedtime.    . cetirizine (ZYRTEC) 10 MG tablet Take 10 mg by mouth at bedtime.     . cholecalciferol (VITAMIN D) 1000 UNITS tablet Take 1,000 Units by mouth daily.    . clobetasol ointment (TEMOVATE) [id number] % Apply 1 application topically 2 (two) times daily as needed (for eczema).    . clotrimazole-betamethasone (LOTRISONE) cream Apply 1 application topically 2 (two) times daily. 30 g 0  . diphenhydrAMINE (BENADRYL) 25 MG tablet Take 50 mg by mouth at bedtime.     . DULoxetine (CYMBALTA) 30 MG capsule TAKE 1 CAPSULE BY MOUTH EVERY DAY FOR 1 WEEK, THEN INCREASE TO 2 CAPSULES DAILY IF TOLERATING LOW DOSE 163 capsule 1  . gabapentin (NEURONTIN) 300 MG capsule TAKE 1 TO 3 CAPSULES BY MOUTH EVERY DAY AS DIRECTED 270 capsule 1  . Multiple Vitamins-Minerals (PRESERVISION AREDS 2) CAPS Take 1 capsule by mouth 2 (two) times daily.     Vladimir Faster Glycol-Propyl Glycol (SYSTANE OP) Apply 1 drop to eye 3 (three) times daily as needed (for dry eyes).     Marland Kitchen rOPINIRole (REQUIP) 0.25 MG tablet Take 1 tablet (0.25 mg total) by mouth at bedtime. 90 tablet 1  . SYNTHROID 125 MCG tablet TAKE 1 TABLET BY MOUTH EVERY MORNING BEFORE BREAKFAST 90 tablet 0  . tacrolimus (PROTOPIC) 0.1 % ointment Apply 0.1 % topically daily as needed (for itchy eyelids).     . vitamin B-12 (CYANOCOBALAMIN) 1000 MCG tablet Take 1,000 mcg by mouth daily.     No current facility-administered medications on file prior to visit.    BP 118/82 mmHg  Pulse 79  Temp(Src) 97.8 F (36.6 C) (Oral)  Wt 155 lb 12.8 oz (70.67 kg)  SpO2 97%       Objective:   Physical Exam  Constitutional: She is oriented to person, place, and time. She appears well-developed and well-nourished.  HENT:  Right Ear: Tympanic membrane normal.  Left Ear: Tympanic membrane normal.  Nose: Right sinus exhibits no maxillary sinus tenderness and no frontal sinus  tenderness. Left sinus exhibits no maxillary sinus tenderness and no frontal sinus tenderness.  Cerumen noted bilaterally  Cardiovascular: Normal rate, regular rhythm, normal heart sounds and intact distal pulses.   Orthostatic BPs: 120/82 sitting and 116/80 standing. No dizziness noted when standing.  Pulmonary/Chest: Effort normal and breath sounds normal. She has no wheezes. She has no rales.  Abdominal: Soft. Bowel sounds are normal.  Lymphadenopathy:    She has no cervical adenopathy.  Neurological: She is alert and oriented to person, place, and time. She has normal strength. No cranial nerve deficit. Gait normal.  Negative Dix-Hallpike. Patient uses cane with ambulation. History  of sacral fracture and spinal stenosis.  Skin: Skin is warm and dry.  Psychiatric: She has a normal mood and affect. Her behavior is normal.       Assessment & Plan:  1. Dizziness and giddiness D/C benedryl at night and advised patient to rise slowly from a seated position while placing feet on the floor rock back and forth between toes and heels x 10 before standing.  Suspect possible gastroenteritis or  adverse effect from benedryl or cymbalta which has been newly prescribed for her by her orthopedic provider for pain control. Patient only used Cymbalta for 2 days and did not take medication today noting that she is not having pain at this time. She will hold off on Cymbalta and D/C benedryl until symptoms have improved.  Advised patient to increase fluids and slowly progress diet as tolerated. If symptoms do not improve in 3-4 days, worsen, or she develops a fever, she will contact clinic for further evaluation with her PCP.  Advised use of ear wax softening drops for cerumen as needed.

## 2015-12-28 DIAGNOSIS — M5440 Lumbago with sciatica, unspecified side: Secondary | ICD-10-CM | POA: Diagnosis not present

## 2015-12-28 DIAGNOSIS — Z6829 Body mass index (BMI) 29.0-29.9, adult: Secondary | ICD-10-CM | POA: Diagnosis not present

## 2016-01-06 DIAGNOSIS — M5416 Radiculopathy, lumbar region: Secondary | ICD-10-CM | POA: Diagnosis not present

## 2016-01-08 ENCOUNTER — Other Ambulatory Visit: Payer: Self-pay | Admitting: Family Medicine

## 2016-01-09 ENCOUNTER — Other Ambulatory Visit: Payer: Self-pay | Admitting: Family Medicine

## 2016-01-17 ENCOUNTER — Telehealth: Payer: Self-pay | Admitting: Family Medicine

## 2016-01-17 ENCOUNTER — Ambulatory Visit (INDEPENDENT_AMBULATORY_CARE_PROVIDER_SITE_OTHER): Payer: Medicare Other | Admitting: Family Medicine

## 2016-01-17 VITALS — BP 120/90 | HR 74 | Temp 98.3°F | Ht 63.5 in | Wt 153.2 lb

## 2016-01-17 DIAGNOSIS — H66001 Acute suppurative otitis media without spontaneous rupture of ear drum, right ear: Secondary | ICD-10-CM

## 2016-01-17 DIAGNOSIS — R05 Cough: Secondary | ICD-10-CM

## 2016-01-17 DIAGNOSIS — R059 Cough, unspecified: Secondary | ICD-10-CM

## 2016-01-17 MED ORDER — DOXYCYCLINE HYCLATE 100 MG PO TABS: 100.0000 mg | ORAL_TABLET | Freq: Two times a day (BID) | ORAL | Status: DC

## 2016-01-17 MED ORDER — BENZONATATE 100 MG PO CAPS: 100.0000 mg | ORAL_CAPSULE | Freq: Three times a day (TID) | ORAL | Status: DC | PRN

## 2016-01-17 MED ORDER — DOXYCYCLINE HYCLATE 100 MG PO TABS
100.0000 mg | ORAL_TABLET | Freq: Two times a day (BID) | ORAL | Status: DC
Start: 2016-01-17 — End: 2024-04-25

## 2016-01-17 MED ORDER — BENZONATATE 100 MG PO CAPS: 100.0000 mg | ORAL_CAPSULE | Freq: Two times a day (BID) | ORAL | Status: DC | PRN

## 2016-01-17 NOTE — Patient Instructions (Addendum)
May take Tessalon Pearls up to three times daily.  Otitis Media, Adult Otitis media is redness, soreness, and puffiness (swelling) in the space just behind your eardrum (middle ear). It may be caused by allergies or infection. It often happens along with a cold. HOME CARE  Take your medicine as told. Finish it even if you start to feel better.  Only take over-the-counter or prescription medicines for pain, discomfort, or fever as told by your doctor.  Follow up with your doctor as told. GET HELP IF:  You have otitis media only in one ear, or bleeding from your nose, or both.  You notice a lump on your neck.  You are not getting better in 3-5 days.  You feel worse instead of better. GET HELP RIGHT AWAY IF:   You have pain that is not helped with medicine.  You have puffiness, redness, or pain around your ear.  You get a stiff neck.  You cannot move part of your face (paralysis).  You notice that the bone behind your ear hurts when you touch it. MAKE SURE YOU:   Understand these instructions.  Will watch your condition.  Will get help right away if you are not doing well or get worse.   This information is not intended to replace advice given to you by your health care provider. Make sure you discuss any questions you have with your health care provider.   Document Released: 03/19/2008 Document Revised: 10/22/2014 Document Reviewed: 04/28/2013 Elsevier Interactive Patient Education [employer].

## 2016-01-17 NOTE — Telephone Encounter (Signed)
Pt is aware that old RX was cancelled at [facility] is aware to fill.

## 2016-01-17 NOTE — Telephone Encounter (Signed)
Pharmacy states the doxycycline (VIBRA-TABS) 100 MG tablet Was sent to multiple pharmacies and it would not let them bill her insurance, because someone else has already billed it. Pharmacy needs to know where it could have been sent, so they can get them to back it out. Please advise. Thank you.  [facility]

## 2016-01-17 NOTE — Progress Notes (Signed)
   Subjective:    Patient ID: Carolyn Henderson, female    DOB: Jun 23, 1930, 82 y.o.   MRN: [id number]  HPI Acute Visit   Patient seen today with a complaint of cough and congestions times 5 days.   Reports fever began two days ago, 99.6 and 100.4 maximum.   She reports fatigue, productive cough with intermittent wheeze that clears with coughing, clear nasal drainage, and mildly sore throat. Denies nausea and vomiting. Reports regular fluid and food intake. She has taken ibuprofen once for fever.   Ear Infection Patient seen yesterday by speech pathologist to have hearing evaluated.  She was told at visit that she had fluid and infection in both ears.  She denies ear pain, balance disturbances, or ear drainage.  She wears hearing aids daily and has a history of hearing impairment.    Review of Systems  Constitutional: Positive for fever and fatigue.       See HPI  HENT: Positive for congestion, rhinorrhea, sneezing and sore throat.   Respiratory: Positive for wheezing.        See HPI  Cardiovascular: Negative.   Gastrointestinal: Negative for nausea.  Skin: Negative.        Objective:   Physical Exam  Constitutional: She is oriented to person, place, and time. She appears well-developed and well-nourished.  HENT:  Head: Normocephalic and atraumatic.  Eyes: Conjunctivae are normal. Pupils are equal, round, and reactive to light.  Cardiovascular: Normal rate, regular rhythm, normal heart sounds and intact distal pulses.   Pulmonary/Chest: Effort normal and breath sounds normal.  Neurological: She is alert and oriented to person, place, and time.  Skin: Skin is warm and dry.          Assessment & Plan:  1. Cough-Patient seen today with a complaint of cough times five days with expiratory wheezing heard with auscultation. Patient is absent of dyspnea and oxygen saturation stable at 97%.  Plan:  Benzonatate 100 mg tid as needed for cough.  2. Acute Otitis Media Right Ear -  Patient is seen today per recommendation of speech pathologist.  During a hearing evaluation, speech pathologist suspected that patient's ears were possible infected. On examination bilateral effusions present, right ear appeared red and retracted.  Left ear had mild chronic changes related to prior infections.  Plan: Doxycycline 100 mg twice daily for 14 days.  Allergies for PCN and cephalosporins.

## 2016-01-17 NOTE — Progress Notes (Signed)
Pre visit review using our clinic review tool, if applicable. No additional management support is needed unless otherwise documented below in the visit note. 

## 2016-01-18 ENCOUNTER — Ambulatory Visit: Payer: Medicare Other | Admitting: Family Medicine

## 2016-01-26 DIAGNOSIS — H43813 Vitreous degeneration, bilateral: Secondary | ICD-10-CM | POA: Diagnosis not present

## 2016-01-26 DIAGNOSIS — H353132 Nonexudative age-related macular degeneration, bilateral, intermediate dry stage: Secondary | ICD-10-CM | POA: Diagnosis not present

## 2016-02-09 DIAGNOSIS — I10 Essential (primary) hypertension: Secondary | ICD-10-CM | POA: Diagnosis not present

## 2016-02-09 DIAGNOSIS — Z6828 Body mass index (BMI) 28.0-28.9, adult: Secondary | ICD-10-CM | POA: Diagnosis not present

## 2016-02-09 DIAGNOSIS — M5440 Lumbago with sciatica, unspecified side: Secondary | ICD-10-CM | POA: Diagnosis not present

## 2016-02-21 DIAGNOSIS — M79604 Pain in right leg: Secondary | ICD-10-CM | POA: Diagnosis not present

## 2016-02-21 DIAGNOSIS — K591 Functional diarrhea: Secondary | ICD-10-CM | POA: Diagnosis not present

## 2016-02-21 DIAGNOSIS — B0229 Other postherpetic nervous system involvement: Secondary | ICD-10-CM | POA: Diagnosis not present

## 2016-02-21 DIAGNOSIS — I10 Essential (primary) hypertension: Secondary | ICD-10-CM | POA: Diagnosis not present

## 2016-02-21 DIAGNOSIS — M5441 Lumbago with sciatica, right side: Secondary | ICD-10-CM | POA: Diagnosis not present

## 2016-02-21 DIAGNOSIS — G8929 Other chronic pain: Secondary | ICD-10-CM | POA: Diagnosis not present

## 2016-02-21 DIAGNOSIS — E039 Hypothyroidism, unspecified: Secondary | ICD-10-CM | POA: Diagnosis not present

## 2016-02-21 DIAGNOSIS — R0609 Other forms of dyspnea: Secondary | ICD-10-CM | POA: Diagnosis not present

## 2016-02-24 DIAGNOSIS — M4806 Spinal stenosis, lumbar region: Secondary | ICD-10-CM | POA: Diagnosis not present

## 2016-02-24 DIAGNOSIS — M4727 Other spondylosis with radiculopathy, lumbosacral region: Secondary | ICD-10-CM | POA: Diagnosis not present

## 2016-02-24 DIAGNOSIS — M5136 Other intervertebral disc degeneration, lumbar region: Secondary | ICD-10-CM | POA: Diagnosis not present

## 2016-02-24 DIAGNOSIS — M5416 Radiculopathy, lumbar region: Secondary | ICD-10-CM | POA: Diagnosis not present

## 2016-03-17 ENCOUNTER — Other Ambulatory Visit: Payer: Self-pay | Admitting: Family Medicine

## 2016-04-12 DIAGNOSIS — R2681 Unsteadiness on feet: Secondary | ICD-10-CM | POA: Diagnosis not present

## 2016-04-12 DIAGNOSIS — D649 Anemia, unspecified: Secondary | ICD-10-CM | POA: Diagnosis not present

## 2016-04-12 DIAGNOSIS — R233 Spontaneous ecchymoses: Secondary | ICD-10-CM | POA: Diagnosis not present

## 2016-04-12 DIAGNOSIS — G2581 Restless legs syndrome: Secondary | ICD-10-CM | POA: Diagnosis not present

## 2016-04-20 ENCOUNTER — Emergency Department (HOSPITAL_COMMUNITY): Payer: Medicare Other

## 2016-04-20 ENCOUNTER — Encounter (HOSPITAL_COMMUNITY): Payer: Self-pay | Admitting: Emergency Medicine

## 2016-04-20 ENCOUNTER — Emergency Department (HOSPITAL_COMMUNITY)
Admission: EM | Admit: 2016-04-20 | Discharge: 2016-04-20 | Disposition: A | Discharge status: Home / Self Care | Payer: Medicare Other | Attending: Emergency Medicine | Admitting: Emergency Medicine

## 2016-04-20 DIAGNOSIS — E785 Hyperlipidemia, unspecified: Secondary | ICD-10-CM | POA: Insufficient documentation

## 2016-04-20 DIAGNOSIS — M549 Dorsalgia, unspecified: Secondary | ICD-10-CM

## 2016-04-20 DIAGNOSIS — Z87891 Personal history of nicotine dependence: Secondary | ICD-10-CM | POA: Insufficient documentation

## 2016-04-20 DIAGNOSIS — S32401A Unspecified fracture of right acetabulum, initial encounter for closed fracture: Secondary | ICD-10-CM

## 2016-04-20 DIAGNOSIS — M1990 Unspecified osteoarthritis, unspecified site: Secondary | ICD-10-CM | POA: Insufficient documentation

## 2016-04-20 DIAGNOSIS — W010XXA Fall on same level from slipping, tripping and stumbling without subsequent striking against object, initial encounter: Secondary | ICD-10-CM | POA: Diagnosis not present

## 2016-04-20 DIAGNOSIS — M79604 Pain in right leg: Secondary | ICD-10-CM

## 2016-04-20 DIAGNOSIS — E079 Disorder of thyroid, unspecified: Secondary | ICD-10-CM | POA: Diagnosis not present

## 2016-04-20 DIAGNOSIS — Y929 Unspecified place or not applicable: Secondary | ICD-10-CM | POA: Insufficient documentation

## 2016-04-20 DIAGNOSIS — S3993XA Unspecified injury of pelvis, initial encounter: Secondary | ICD-10-CM | POA: Diagnosis present

## 2016-04-20 DIAGNOSIS — I10 Essential (primary) hypertension: Secondary | ICD-10-CM | POA: Insufficient documentation

## 2016-04-20 DIAGNOSIS — W06XXXA Fall from bed, initial encounter: Secondary | ICD-10-CM | POA: Insufficient documentation

## 2016-04-20 DIAGNOSIS — S32441A Displaced fracture of posterior column [ilioischial] of right acetabulum, initial encounter for closed fracture: Secondary | ICD-10-CM | POA: Insufficient documentation

## 2016-04-20 DIAGNOSIS — S32431A Displaced fracture of anterior column [iliopubic] of right acetabulum, initial encounter for closed fracture: Secondary | ICD-10-CM | POA: Insufficient documentation

## 2016-04-20 DIAGNOSIS — Z79899 Other long term (current) drug therapy: Secondary | ICD-10-CM | POA: Diagnosis not present

## 2016-04-20 DIAGNOSIS — S32471A Displaced fracture of medial wall of right acetabulum, initial encounter for closed fracture: Secondary | ICD-10-CM | POA: Diagnosis not present

## 2016-04-20 DIAGNOSIS — Y939 Activity, unspecified: Secondary | ICD-10-CM | POA: Insufficient documentation

## 2016-04-20 DIAGNOSIS — Z8719 Personal history of other diseases of the digestive system: Secondary | ICD-10-CM | POA: Insufficient documentation

## 2016-04-20 DIAGNOSIS — M25551 Pain in right hip: Secondary | ICD-10-CM | POA: Diagnosis not present

## 2016-04-20 DIAGNOSIS — M545 Low back pain: Secondary | ICD-10-CM | POA: Diagnosis not present

## 2016-04-20 DIAGNOSIS — Y999 Unspecified external cause status: Secondary | ICD-10-CM | POA: Insufficient documentation

## 2016-04-20 DIAGNOSIS — S3992XA Unspecified injury of lower back, initial encounter: Secondary | ICD-10-CM | POA: Diagnosis not present

## 2016-04-20 LAB — BASIC METABOLIC PANEL
Anion gap: 9 (ref 5–15)
BUN: 16 mg/dL (ref 6–20)
CHLORIDE: 101 mmol/L (ref 101–111)
CO2: 23 mmol/L (ref 22–32)
Calcium: 8.6 mg/dL — ABNORMAL LOW (ref 8.9–10.3)
Creatinine, Ser: 0.54 mg/dL (ref 0.44–1.00)
GFR calc Af Amer: >60 mL/min (ref >60)
Glucose, Bld: 89 mg/dL (ref 65–99)
POTASSIUM: 3.7 mmol/L (ref 3.5–5.1)
SODIUM: 133 mmol/L — AB (ref 135–145)

## 2016-04-20 LAB — CBC WITH DIFFERENTIAL/PLATELET
BASOS PCT: 0 %
Basophils Absolute: 0 10*3/uL (ref 0.0–0.1)
EOS ABS: 0.2 10*3/uL (ref 0.0–0.7)
EOS PCT: 2 %
HEMATOCRIT: 30.8 % — AB (ref 36.0–46.0)
HEMOGLOBIN: 9.2 g/dL — AB (ref 12.0–15.0)
LYMPHS PCT: 12 %
Lymphs Abs: 1 10*3/uL (ref 0.7–4.0)
MCH: 20.6 pg — AB (ref 26.0–34.0)
MCHC: 29.9 g/dL — ABNORMAL LOW (ref 30.0–36.0)
MCV: 69.1 fL — AB (ref 78.0–100.0)
MONOS PCT: 7 %
Monocytes Absolute: 0.6 10*3/uL (ref 0.1–1.0)
NEUTROS PCT: 79 %
Neutro Abs: 6.9 10*3/uL (ref 1.7–7.7)
Platelets: 346 10*3/uL (ref 150–400)
RBC: 4.46 MIL/uL (ref 3.87–5.11)
RDW: 19 % — ABNORMAL HIGH (ref 11.5–15.5)
WBC: 8.7 10*3/uL (ref 4.0–10.5)

## 2016-04-20 MED ORDER — OXYCODONE HCL 5 MG PO TABS: 2.5000 mg | ORAL_TABLET | ORAL | Status: DC | PRN

## 2016-04-20 MED ORDER — OXYCODONE HCL 5 MG PO TABS
2.5000 mg | ORAL_TABLET | Freq: Once | ORAL | Status: AC
  Administered 2016-04-20: 2.5 mg via ORAL
  Filled 2016-04-20: qty 1

## 2016-04-20 MED ORDER — ONDANSETRON 4 MG PO TBDP: ORAL_TABLET | ORAL | Status: DC

## 2016-04-20 MED ORDER — OXYCODONE HCL 5 MG PO TABS
2.5000 mg | ORAL_TABLET | Freq: Once | ORAL | Status: DC
  Filled 2016-04-20: qty 1

## 2016-04-20 MED ORDER — ONDANSETRON 4 MG PO TBDP
4.0000 mg | ORAL_TABLET | Freq: Once | ORAL | Status: AC
  Administered 2016-04-20: 4 mg via ORAL
  Filled 2016-04-20: qty 1

## 2016-04-20 MED ORDER — NAPROXEN 500 MG PO TABS
500.0000 mg | ORAL_TABLET | Freq: Once | ORAL | Status: AC
  Administered 2016-04-20: 500 mg via ORAL
  Filled 2016-04-20: qty 1

## 2016-04-20 NOTE — Progress Notes (Signed)
CM reviewed in details medicare guidelines, Choices of home health [facility]) (length of stay in home, types of [facility] staff available, coverage, primary caregiver, up to 24 hrs before services may be started) and choices of Private duty nursing (PDN-coverage, length of stay in the home types of staff available). CM reviewed availability of [place] [facility] to assist pcp to get pt to snf (if desired disposition) from the community level. CM provided pt/family with a list of [place] home health agencies, choice connnections and PDN.  Pt choice of HH agency is "[facility]" states husband will be her primary care giver and female family member will assist  Pt states she has all DME at home except a w/c and has not had one in 2-3 yrs States she has always borrowed a w/c from someone Pt wt is 153 lbs ht 5"3

## 2016-04-20 NOTE — Progress Notes (Signed)
Spoke with Carolyn Henderson about referral She received referral and will process Spoke with Jermaine at [facility] for w/c order for pt [phone]

## 2016-04-20 NOTE — Progress Notes (Signed)
Patient suffers from acute mildly displaced right acetabular fracture involving the anterior and posterior columns which impairs their ability to perform daily activities like mobility, bathing, dressing and toileting in the home. A cane, walker nor standard wheelchair will not resolve  issue with performing activities of daily living. A wheelchair will allow patient to safely perform daily activities. Patient is not able to propel themselves in the home using a standard weight wheelchair due to acute mildly displaced right acetabular fracture involving the anterior and posterior columns, weight 160 lbs and small doorways at home. Patient can self propel in the lightweight wheelchair.  Accessories: elevating leg rests (ELRs), wheel locks, extensions and anti-tippers.

## 2016-04-20 NOTE — Progress Notes (Signed)
Spoke with EDP, [name] about Cm consult for this pt  EDP states pt & family preference and choice is for d/c home with home health services  Will assess pt for services/DME/other needs

## 2016-04-20 NOTE — Progress Notes (Signed)
Spoke with Linus Mako Left message for Butch Penny at [phone] to provide referral information Left Cm mobile number for at return

## 2016-04-20 NOTE — ED Notes (Signed)
Pt c/o right hip pain and lower back/pelvis pain after a fall this am. Pt states she had a fall about a year ago and fractured her pelvis & has a hx of right hip replacement. Pt also adds she just had routine lab work done stating she is anemic with hemoglobin of 8.6.

## 2016-04-20 NOTE — ED Provider Notes (Signed)
CSN: [id number]     Arrival date & time 04/20/16  1151 History   First MD Initiated Contact with Patient 04/20/16 1159     Chief Complaint  Patient presents with  . Hip Pain     (Consider location/radiation/quality/duration/timing/severity/associated sxs/prior Treatment) Patient is a 82 y.o. female presenting with hip pain. The history is provided by the patient.  Hip Pain This is a new problem. The current episode started less than 1 hour ago. The problem occurs constantly. Pertinent negatives include no chest pain, no abdominal pain, no headaches and no shortness of breath. The symptoms are aggravated by walking. Nothing relieves the symptoms. She has tried nothing for the symptoms. The treatment provided no relief.   82 yo F With a fall out of bed. Patient felt that she slipped on the floor and landed on her bottom. Complaining of pain mostly to the right SI joint. She is concerned because she has a prior fracture in the area when she had a fall about a year and half ago. She denies other injury. Denies specifically chest pain neck pain abdominal pain.  Past Medical History  Diagnosis Date  . Hypertension   . Hyperlipidemia   . Thyroid disease   . Arthritis   . Complication of anesthesia   . PONV (postoperative nausea and vomiting)   . Shortness of breath     WITH EXERTION  . Osteopenia   . Precancerous skin lesion   . Diverticulosis   . GERD (gastroesophageal reflux disease)     ONLY OCCASIONAL  . Difficulty sleeping     TAKES TYLENOL PM NIGHTLY  . History of shingles    Past Surgical History  Procedure Laterality Date  . Cataract extraction    . Total hip arthroplasty  1999 / 2007    BILATERAL  . Abdominal hysterectomy    . Lumbar laminectomy    . Tonsillectomy    . Colon surgery  1983    COLON RESECTION  . Back surgery    . Joint replacement      TOTAL KNEE 2012 / BIL TOTAL HIPS   . Total hip revision Right 08/02/2014    Procedure: RIGHT TOTAL HIP REVISION;   Surgeon: Mauri Pole, MD;  Location: [facility];  Service: Orthopedics;  Laterality: Right;   Family History  Problem Relation Age of Onset  . Hypertension      fhx  . Cancer      prostate/fhx   Social History  Substance Use Topics  . Smoking status: Former Smoker    Quit date: 07/21/1992  . Smokeless tobacco: None  . Alcohol Use: No   OB History    No data available     Review of Systems  Constitutional: Negative for fever and chills.  HENT: Negative for congestion and rhinorrhea.   Eyes: Negative for redness and visual disturbance.  Respiratory: Negative for shortness of breath and wheezing.   Cardiovascular: Negative for chest pain and palpitations.  Gastrointestinal: Negative for nausea, vomiting and abdominal pain.  Genitourinary: Negative for dysuria and urgency.  Musculoskeletal: Positive for myalgias and arthralgias.  Skin: Negative for pallor and wound.  Neurological: Negative for dizziness and headaches.      Allergies  Cephalosporins; Codeine sulfate; Hydrocodone; Percocet; Tramadol; and Penicillins  Home Medications   Prior to Admission medications   Medication Sig Start Date End Date Taking? Authorizing Provider  acetaminophen (TYLENOL) 325 MG tablet Take 2 tablets (650 mg total) by mouth every 6 (six) hours  as needed for mild pain (or Fever >/= 101). 08/05/14  Yes Matthew Babish, PA-C  amLODipine (NORVASC) 2.5 MG tablet Take 1 tablet (2.5 mg total) by mouth daily. 10/21/15  Yes Eulas Post, MD  benzonatate (TESSALON) 100 MG capsule Take 1 capsule (100 mg total) by mouth 3 (three) times daily as needed for cough. 01/17/16  Yes Eulas Post, MD  Calcium Carbonate-Vit D-Min (CALCIUM 1200 PO) Take by mouth.   Yes Historical Provider, MD  Carboxymethylcell-Hypromellose (GENTEAL OP) Apply 1 drop to eye at bedtime.   Yes Historical Provider, MD  cetirizine (ZYRTEC) 10 MG tablet Take 10 mg by mouth at bedtime.    Yes Historical Provider, MD  cholecalciferol  (VITAMIN D) 1000 UNITS tablet Take 1,000 Units by mouth daily.   Yes Historical Provider, MD  clobetasol ointment (TEMOVATE) [id number] % Apply 1 application topically 2 (two) times daily as needed (for eczema).   Yes Historical Provider, MD  clotrimazole-betamethasone (LOTRISONE) cream Apply 1 application topically 2 (two) times daily. Patient taking differently: Apply 1 application topically 2 (two) times daily as needed (itching, eczema).  10/27/15  Yes Eulas Post, MD  diphenhydrAMINE (BENADRYL) 25 MG tablet Take 25-50 mg by mouth every 6 (six) hours as needed for itching or allergies.    Yes Historical Provider, MD  ferrous sulfate 324 (65 Fe) MG TBEC Take 324 mg by mouth daily. 04/16/16  Yes Historical Provider, MD  gabapentin (NEURONTIN) 300 MG capsule TAKE 1 TO 3 CAPSULES BY MOUTH EVERY DAY AS DIRECTED Patient taking differently: Take 300 mg by mouth 3 (three) times daily.  08/18/15  Yes Dorena Cookey, MD  Multiple Vitamins-Minerals (PRESERVISION AREDS 2) CAPS Take 1 capsule by mouth 2 (two) times daily.    Yes Historical Provider, MD  Omega-3 Fatty Acids (FISH OIL PO) Take 1 capsule by mouth 2 (two) times daily.   Yes Historical Provider, MD  Polyethyl Glycol-Propyl Glycol (SYSTANE OP) Apply 1 drop to eye 3 (three) times daily as needed (for dry eyes).    Yes Historical Provider, MD  Probiotic Product (ACIDOPHILUS/GOAT MILK) CAPS Take 1 capsule by mouth every evening.   Yes Historical Provider, MD  rOPINIRole (REQUIP) 0.25 MG tablet Take 1 tablet by mouth at  bedtime Patient taking differently: Take 0.5mg  by mouth at  bedtime for restless legs 03/19/16  Yes Eulas Post, MD  SYNTHROID 125 MCG tablet TAKE 1 TABLET BY MOUTH EVERY MORNING BEFORE BREAKFAST 01/09/16  Yes Eulas Post, MD  tacrolimus (PROTOPIC) 0.1 % ointment Apply 0.1 % topically daily as needed (for itchy eyelids).    Yes Historical Provider, MD  vitamin B-12 (CYANOCOBALAMIN) 1000 MCG tablet Take 1,000 mcg by mouth daily.    Yes Historical Provider, MD  doxycycline (VIBRA-TABS) 100 MG tablet Take 1 tablet (100 mg total) by mouth 2 (two) times daily. Patient not taking: Reported on 04/20/2016 01/17/16   Eulas Post, MD  ondansetron (ZOFRAN ODT) 4 MG disintegrating tablet 4mg  ODT q4 hours prn nausea/vomit 04/20/16   Deno Etienne, DO  ondansetron (ZOFRAN ODT) 4 MG disintegrating tablet 4mg  ODT q4 hours prn nausea/vomit 04/20/16   Deno Etienne, DO  oxyCODONE (ROXICODONE) 5 MG immediate release tablet Take 0.5 tablets (2.5 mg total) by mouth every 4 (four) hours as needed for severe pain. 04/20/16   Deno Etienne, DO  oxyCODONE (ROXICODONE) 5 MG immediate release tablet Take 0.5 tablets (2.5 mg total) by mouth every 4 (four) hours as needed for severe pain. 04/20/16  Deno Etienne, DO   BP 137/56 mmHg  Pulse 63  Temp(Src) 98.2 F (36.8 C) (Oral)  Resp 18  SpO2 96% Physical Exam  Constitutional: She is oriented to person, place, and time. She appears well-developed and well-nourished. No distress.  HENT:  Head: Normocephalic and atraumatic.  Eyes: EOM are normal. Pupils are equal, round, and reactive to light.  Neck: Normal range of motion. Neck supple.  Cardiovascular: Normal rate and regular rhythm.  Exam reveals no gallop and no friction rub.   No murmur heard. Pulmonary/Chest: Effort normal. She has no wheezes. She has no rales.  Abdominal: Soft. She exhibits no distension. There is no tenderness.  Musculoskeletal: She exhibits tenderness (TTP worst about R SI joint). She exhibits no edema.  Right SI joint pain.  RLE with internal rotation, chronic per patient   Neurological: She is alert and oriented to person, place, and time.  Skin: Skin is warm and dry. She is not diaphoretic.  Small abrasion to R posterior arm.  Psychiatric: She has a normal mood and affect. Her behavior is normal.  Nursing note and vitals reviewed.   ED Course  Procedures (including critical care time) Labs Review Labs Reviewed  CBC WITH  DIFFERENTIAL/PLATELET - Abnormal; Notable for the following:    Hemoglobin 9.2 (*)    HCT 30.8 (*)    MCV 69.1 (*)    MCH 20.6 (*)    MCHC 29.9 (*)    RDW 19.0 (*)    All other components within normal limits  BASIC METABOLIC PANEL - Abnormal; Notable for the following:    Sodium 133 (*)    Calcium 8.6 (*)    All other components within normal limits    Imaging Review Dg Lumbar Spine Complete  04/20/2016  CLINICAL DATA:  [place] walking to the bathroom this morning. Right-sided low back pain and hip pain. EXAM: LUMBAR SPINE - COMPLETE 4+ VIEW COMPARISON:  Radiography 12/28/2015 FINDINGS: Mild spinal curvature. Disc space narrowing throughout the lumbar region. Chronic lumbar facet arthropathy. No fracture. No traumatic subluxation. No focal lesion. Aortic atherosclerosis. No sign of aneurysm. IMPRESSION: No acute or traumatic finding. Chronic degenerative disc disease and degenerative facet disease throughout the lumbar region. Aortic atherosclerosis. Electronically Signed   By: Nelson Chimes M.D.   On: 04/20/2016 12:40   Ct Pelvis Wo Contrast  04/20/2016  CLINICAL DATA:  33 female with right sacroiliac joint after fall this morning. EXAM: CT PELVIS WITHOUT CONTRAST TECHNIQUE: Multidetector CT imaging of the pelvis was performed following the standard protocol without intravenous contrast. COMPARISON:  Pelvic and right hip radiographs from earlier today. 10/08/2014 CT pelvis. FINDINGS: Reproductive: Status post hysterectomy. Vaginal cuff is poorly visualized due to streak artifact from bilateral hip hardware. No adnexal mass. Bladder: Poorly visualized due to streak artifact. Bowel: Visualized small and large bowel are normal caliber with no bowel wall thickening. Mild sigmoid diverticulosis. Vascular/Lymphatic: No pathologically enlarged lymph nodes in the pelvis. Atherosclerotic nonaneurysmal visualized abdominal aorta. Other: No pneumoperitoneum, ascites or focal fluid collection.  Musculoskeletal: No aggressive appearing focal osseous lesions. Patient is status post bilateral total hip arthroplasty, with well-positioned acetabular and proximal femoral prostheses bilaterally, with no evidence of hardware fracture or loosening. There is an acute mildly displaced right acetabular fracture involving the anterior column (series 2/image 50) and posterior column (series 2/ image 49), with approximately 6 mm anterior/ superior displacement of the anterior inferior right acetabular fracture fragment. No additional pelvic fracture no pelvic diastasis. No dislocation at the  hip joints. Severe degenerative disc disease in the visualized lower lumbar spine with mild 4 mm anterolisthesis at L5-S1. Marked facet arthropathy bilaterally in the lower lumbar spine. Mild-to-moderate osteoarthritis bilaterally in the sacroiliac joints. Degenerative changes at the pubic symphysis. IMPRESSION: 1. Acute mildly displaced right acetabular fracture involving the anterior and posterior columns as described. 2. Status post bilateral total hip arthroplasty with no hardware fracture or loosening. 3. No hip dislocation. 4. Marked degenerative changes in the lower lumbar spine. 5. Mild sigmoid diverticulosis. 6. Aortic atherosclerosis. Electronically Signed   By: Ilona Sorrel M.D.   On: 04/20/2016 13:51   Dg Hip Unilat With Pelvis 2-3 Views Right  04/20/2016  CLINICAL DATA:  [place] walking to the bathroom this morning. Right-sided hip and back pain. EXAM: DG HIP (WITH OR WITHOUT PELVIS) 2-3V RIGHT COMPARISON:  None. FINDINGS: Bilateral total hip replacements. No evidence of fracture or complication in those regions. Pelvis otherwise appears normal. IMPRESSION: No acute or traumatic finding. Bilateral hip replacements. No complications seen on the right. Electronically Signed   By: Nelson Chimes M.D.   On: 04/20/2016 12:35   I have personally reviewed and evaluated these images and lab results as part of my medical  decision-making.   EKG Interpretation None      MDM   Final diagnoses:  Pain of back and right lower extremity  Acetabular fracture, right, closed, initial encounter    82 yo F with a chief complaint of right-sided lower back pain. This is post fall. Will start with a plain film. Attempt ambulate.  Patient with a negative plain film of the low back and pelvis. CT scan of the pelvis with a acetabular fracture. Discussed with Dr Alvan Dame, Ortho, recommends 25% weightbearing of the right lower extremity follow-up in his office in 2-3 weeks. The patient's pain is currently well controlled on reexam. Discussed with social work the availability of home health. 5:04 PM:  I have discussed the diagnosis/risks/treatment options with the patient and family and believe the pt to be eligible for discharge home to follow-up with Ortho. We also discussed returning to the ED immediately if new or worsening sx occur. We discussed the sx which are most concerning (e.g., sudden worsening pain, fever, inability to tolerate by mouth) that necessitate immediate return. Medications administered to the patient during their visit and any new prescriptions provided to the patient are listed below.  Medications given during this visit Medications  oxyCODONE (Oxy IR/ROXICODONE) immediate release tablet 2.5 mg (2.5 mg Oral Refused 04/20/16 1236)  naproxen (NAPROSYN) tablet 500 mg (500 mg Oral Given 04/20/16 1236)  ondansetron (ZOFRAN-ODT) disintegrating tablet 4 mg (4 mg Oral Given 04/20/16 1346)  oxyCODONE (Oxy IR/ROXICODONE) immediate release tablet 2.5 mg (2.5 mg Oral Given 04/20/16 1346)    Discharge Medication List as of 04/20/2016  3:34 PM    START taking these medications   Details  ondansetron (ZOFRAN ODT) 4 MG disintegrating tablet 4mg  ODT q4 hours prn nausea/vomit, Print    oxyCODONE (ROXICODONE) 5 MG immediate release tablet Take 0.5 tablets (2.5 mg total) by mouth every 4 (four) hours as needed for severe pain.,  Starting 04/20/2016, Until Discontinued, Print        The patient appears reasonably screen and/or stabilized for discharge and I doubt any other medical condition or other [facility] requiring further screening, evaluation, or treatment in the ED at this time prior to discharge.    Deno Etienne, DO 04/20/16 1704

## 2016-04-20 NOTE — Progress Notes (Signed)
Updated ED RN  Left voice message for Carolyn Henderson [phone] for referral of home health orders for [facility], PT, OT, aide and light wt w/c

## 2016-04-20 NOTE — ED Notes (Signed)
Pt has an irregular radial pulse that is around 63 bpm. Pt states she has never been told before today that she has an irregular pulse

## 2016-04-20 NOTE — Discharge Instructions (Signed)

## 2016-04-25 DIAGNOSIS — M1990 Unspecified osteoarthritis, unspecified site: Secondary | ICD-10-CM | POA: Diagnosis not present

## 2016-04-25 DIAGNOSIS — S32401D Unspecified fracture of right acetabulum, subsequent encounter for fracture with routine healing: Secondary | ICD-10-CM | POA: Diagnosis not present

## 2016-04-25 DIAGNOSIS — G2581 Restless legs syndrome: Secondary | ICD-10-CM | POA: Diagnosis not present

## 2016-04-25 DIAGNOSIS — R009 Unspecified abnormalities of heart beat: Secondary | ICD-10-CM | POA: Diagnosis not present

## 2016-04-25 DIAGNOSIS — I10 Essential (primary) hypertension: Secondary | ICD-10-CM | POA: Diagnosis not present

## 2016-04-25 DIAGNOSIS — B0229 Other postherpetic nervous system involvement: Secondary | ICD-10-CM | POA: Diagnosis not present

## 2016-04-26 DIAGNOSIS — S32401D Unspecified fracture of right acetabulum, subsequent encounter for fracture with routine healing: Secondary | ICD-10-CM | POA: Diagnosis not present

## 2016-04-26 DIAGNOSIS — G2581 Restless legs syndrome: Secondary | ICD-10-CM | POA: Diagnosis not present

## 2016-04-26 DIAGNOSIS — M1990 Unspecified osteoarthritis, unspecified site: Secondary | ICD-10-CM | POA: Diagnosis not present

## 2016-04-26 DIAGNOSIS — I499 Cardiac arrhythmia, unspecified: Secondary | ICD-10-CM | POA: Diagnosis not present

## 2016-04-26 DIAGNOSIS — I10 Essential (primary) hypertension: Secondary | ICD-10-CM | POA: Diagnosis not present

## 2016-04-26 DIAGNOSIS — B0229 Other postherpetic nervous system involvement: Secondary | ICD-10-CM | POA: Diagnosis not present

## 2016-04-26 DIAGNOSIS — R009 Unspecified abnormalities of heart beat: Secondary | ICD-10-CM | POA: Diagnosis not present

## 2016-04-27 DIAGNOSIS — S32401D Unspecified fracture of right acetabulum, subsequent encounter for fracture with routine healing: Secondary | ICD-10-CM | POA: Diagnosis not present

## 2016-04-27 DIAGNOSIS — B0229 Other postherpetic nervous system involvement: Secondary | ICD-10-CM | POA: Diagnosis not present

## 2016-04-27 DIAGNOSIS — R009 Unspecified abnormalities of heart beat: Secondary | ICD-10-CM | POA: Diagnosis not present

## 2016-04-27 DIAGNOSIS — M1990 Unspecified osteoarthritis, unspecified site: Secondary | ICD-10-CM | POA: Diagnosis not present

## 2016-04-27 DIAGNOSIS — G2581 Restless legs syndrome: Secondary | ICD-10-CM | POA: Diagnosis not present

## 2016-04-27 DIAGNOSIS — I10 Essential (primary) hypertension: Secondary | ICD-10-CM | POA: Diagnosis not present

## 2016-04-30 DIAGNOSIS — R009 Unspecified abnormalities of heart beat: Secondary | ICD-10-CM | POA: Diagnosis not present

## 2016-04-30 DIAGNOSIS — G2581 Restless legs syndrome: Secondary | ICD-10-CM | POA: Diagnosis not present

## 2016-04-30 DIAGNOSIS — S32401D Unspecified fracture of right acetabulum, subsequent encounter for fracture with routine healing: Secondary | ICD-10-CM | POA: Diagnosis not present

## 2016-04-30 DIAGNOSIS — B0229 Other postherpetic nervous system involvement: Secondary | ICD-10-CM | POA: Diagnosis not present

## 2016-04-30 DIAGNOSIS — I10 Essential (primary) hypertension: Secondary | ICD-10-CM | POA: Diagnosis not present

## 2016-04-30 DIAGNOSIS — M1990 Unspecified osteoarthritis, unspecified site: Secondary | ICD-10-CM | POA: Diagnosis not present

## 2016-05-01 DIAGNOSIS — B0229 Other postherpetic nervous system involvement: Secondary | ICD-10-CM | POA: Diagnosis not present

## 2016-05-01 DIAGNOSIS — S32401D Unspecified fracture of right acetabulum, subsequent encounter for fracture with routine healing: Secondary | ICD-10-CM | POA: Diagnosis not present

## 2016-05-01 DIAGNOSIS — M1990 Unspecified osteoarthritis, unspecified site: Secondary | ICD-10-CM | POA: Diagnosis not present

## 2016-05-01 DIAGNOSIS — G2581 Restless legs syndrome: Secondary | ICD-10-CM | POA: Diagnosis not present

## 2016-05-01 DIAGNOSIS — R009 Unspecified abnormalities of heart beat: Secondary | ICD-10-CM | POA: Diagnosis not present

## 2016-05-01 DIAGNOSIS — I10 Essential (primary) hypertension: Secondary | ICD-10-CM | POA: Diagnosis not present

## 2016-05-02 DIAGNOSIS — I10 Essential (primary) hypertension: Secondary | ICD-10-CM | POA: Diagnosis not present

## 2016-05-02 DIAGNOSIS — B0229 Other postherpetic nervous system involvement: Secondary | ICD-10-CM | POA: Diagnosis not present

## 2016-05-02 DIAGNOSIS — S32401D Unspecified fracture of right acetabulum, subsequent encounter for fracture with routine healing: Secondary | ICD-10-CM | POA: Diagnosis not present

## 2016-05-02 DIAGNOSIS — R009 Unspecified abnormalities of heart beat: Secondary | ICD-10-CM | POA: Diagnosis not present

## 2016-05-02 DIAGNOSIS — M1990 Unspecified osteoarthritis, unspecified site: Secondary | ICD-10-CM | POA: Diagnosis not present

## 2016-05-02 DIAGNOSIS — G2581 Restless legs syndrome: Secondary | ICD-10-CM | POA: Diagnosis not present

## 2016-05-03 DIAGNOSIS — I10 Essential (primary) hypertension: Secondary | ICD-10-CM | POA: Diagnosis not present

## 2016-05-03 DIAGNOSIS — M1990 Unspecified osteoarthritis, unspecified site: Secondary | ICD-10-CM | POA: Diagnosis not present

## 2016-05-03 DIAGNOSIS — R009 Unspecified abnormalities of heart beat: Secondary | ICD-10-CM | POA: Diagnosis not present

## 2016-05-03 DIAGNOSIS — B0229 Other postherpetic nervous system involvement: Secondary | ICD-10-CM | POA: Diagnosis not present

## 2016-05-03 DIAGNOSIS — S32401D Unspecified fracture of right acetabulum, subsequent encounter for fracture with routine healing: Secondary | ICD-10-CM | POA: Diagnosis not present

## 2016-05-03 DIAGNOSIS — G2581 Restless legs syndrome: Secondary | ICD-10-CM | POA: Diagnosis not present

## 2016-05-08 DIAGNOSIS — S32401D Unspecified fracture of right acetabulum, subsequent encounter for fracture with routine healing: Secondary | ICD-10-CM | POA: Diagnosis not present

## 2016-05-08 DIAGNOSIS — B0229 Other postherpetic nervous system involvement: Secondary | ICD-10-CM | POA: Diagnosis not present

## 2016-05-08 DIAGNOSIS — R009 Unspecified abnormalities of heart beat: Secondary | ICD-10-CM | POA: Diagnosis not present

## 2016-05-08 DIAGNOSIS — G2581 Restless legs syndrome: Secondary | ICD-10-CM | POA: Diagnosis not present

## 2016-05-08 DIAGNOSIS — I10 Essential (primary) hypertension: Secondary | ICD-10-CM | POA: Diagnosis not present

## 2016-05-08 DIAGNOSIS — M1990 Unspecified osteoarthritis, unspecified site: Secondary | ICD-10-CM | POA: Diagnosis not present

## 2016-05-09 DIAGNOSIS — R009 Unspecified abnormalities of heart beat: Secondary | ICD-10-CM | POA: Diagnosis not present

## 2016-05-09 DIAGNOSIS — I10 Essential (primary) hypertension: Secondary | ICD-10-CM | POA: Diagnosis not present

## 2016-05-09 DIAGNOSIS — M1990 Unspecified osteoarthritis, unspecified site: Secondary | ICD-10-CM | POA: Diagnosis not present

## 2016-05-09 DIAGNOSIS — G2581 Restless legs syndrome: Secondary | ICD-10-CM | POA: Diagnosis not present

## 2016-05-09 DIAGNOSIS — S32401D Unspecified fracture of right acetabulum, subsequent encounter for fracture with routine healing: Secondary | ICD-10-CM | POA: Diagnosis not present

## 2016-05-09 DIAGNOSIS — B0229 Other postherpetic nervous system involvement: Secondary | ICD-10-CM | POA: Diagnosis not present

## 2016-05-14 DIAGNOSIS — B0229 Other postherpetic nervous system involvement: Secondary | ICD-10-CM | POA: Diagnosis not present

## 2016-05-14 DIAGNOSIS — M1990 Unspecified osteoarthritis, unspecified site: Secondary | ICD-10-CM | POA: Diagnosis not present

## 2016-05-14 DIAGNOSIS — R009 Unspecified abnormalities of heart beat: Secondary | ICD-10-CM | POA: Diagnosis not present

## 2016-05-14 DIAGNOSIS — G2581 Restless legs syndrome: Secondary | ICD-10-CM | POA: Diagnosis not present

## 2016-05-14 DIAGNOSIS — S32401D Unspecified fracture of right acetabulum, subsequent encounter for fracture with routine healing: Secondary | ICD-10-CM | POA: Diagnosis not present

## 2016-05-14 DIAGNOSIS — I10 Essential (primary) hypertension: Secondary | ICD-10-CM | POA: Diagnosis not present

## 2016-05-16 DIAGNOSIS — S32401D Unspecified fracture of right acetabulum, subsequent encounter for fracture with routine healing: Secondary | ICD-10-CM | POA: Diagnosis not present

## 2016-05-16 DIAGNOSIS — M978XXA Periprosthetic fracture around other internal prosthetic joint, initial encounter: Secondary | ICD-10-CM | POA: Diagnosis not present

## 2016-05-16 DIAGNOSIS — M1990 Unspecified osteoarthritis, unspecified site: Secondary | ICD-10-CM | POA: Diagnosis not present

## 2016-05-16 DIAGNOSIS — G2581 Restless legs syndrome: Secondary | ICD-10-CM | POA: Diagnosis not present

## 2016-05-16 DIAGNOSIS — I10 Essential (primary) hypertension: Secondary | ICD-10-CM | POA: Diagnosis not present

## 2016-05-16 DIAGNOSIS — M7061 Trochanteric bursitis, right hip: Secondary | ICD-10-CM | POA: Diagnosis not present

## 2016-05-16 DIAGNOSIS — B0229 Other postherpetic nervous system involvement: Secondary | ICD-10-CM | POA: Diagnosis not present

## 2016-05-16 DIAGNOSIS — R009 Unspecified abnormalities of heart beat: Secondary | ICD-10-CM | POA: Diagnosis not present

## 2016-05-18 DIAGNOSIS — M1990 Unspecified osteoarthritis, unspecified site: Secondary | ICD-10-CM | POA: Diagnosis not present

## 2016-05-18 DIAGNOSIS — G2581 Restless legs syndrome: Secondary | ICD-10-CM | POA: Diagnosis not present

## 2016-05-18 DIAGNOSIS — I10 Essential (primary) hypertension: Secondary | ICD-10-CM | POA: Diagnosis not present

## 2016-05-18 DIAGNOSIS — B0229 Other postherpetic nervous system involvement: Secondary | ICD-10-CM | POA: Diagnosis not present

## 2016-05-18 DIAGNOSIS — S32401D Unspecified fracture of right acetabulum, subsequent encounter for fracture with routine healing: Secondary | ICD-10-CM | POA: Diagnosis not present

## 2016-05-18 DIAGNOSIS — R009 Unspecified abnormalities of heart beat: Secondary | ICD-10-CM | POA: Diagnosis not present

## 2016-05-22 DIAGNOSIS — R009 Unspecified abnormalities of heart beat: Secondary | ICD-10-CM | POA: Diagnosis not present

## 2016-05-22 DIAGNOSIS — M1990 Unspecified osteoarthritis, unspecified site: Secondary | ICD-10-CM | POA: Diagnosis not present

## 2016-05-22 DIAGNOSIS — S32401D Unspecified fracture of right acetabulum, subsequent encounter for fracture with routine healing: Secondary | ICD-10-CM | POA: Diagnosis not present

## 2016-05-22 DIAGNOSIS — G2581 Restless legs syndrome: Secondary | ICD-10-CM | POA: Diagnosis not present

## 2016-05-22 DIAGNOSIS — B0229 Other postherpetic nervous system involvement: Secondary | ICD-10-CM | POA: Diagnosis not present

## 2016-05-22 DIAGNOSIS — I10 Essential (primary) hypertension: Secondary | ICD-10-CM | POA: Diagnosis not present

## 2016-05-24 DIAGNOSIS — G2581 Restless legs syndrome: Secondary | ICD-10-CM | POA: Diagnosis not present

## 2016-05-24 DIAGNOSIS — I10 Essential (primary) hypertension: Secondary | ICD-10-CM | POA: Diagnosis not present

## 2016-05-24 DIAGNOSIS — S32401D Unspecified fracture of right acetabulum, subsequent encounter for fracture with routine healing: Secondary | ICD-10-CM | POA: Diagnosis not present

## 2016-05-24 DIAGNOSIS — R009 Unspecified abnormalities of heart beat: Secondary | ICD-10-CM | POA: Diagnosis not present

## 2016-05-24 DIAGNOSIS — M1990 Unspecified osteoarthritis, unspecified site: Secondary | ICD-10-CM | POA: Diagnosis not present

## 2016-05-24 DIAGNOSIS — B0229 Other postherpetic nervous system involvement: Secondary | ICD-10-CM | POA: Diagnosis not present

## 2016-05-29 DIAGNOSIS — G2581 Restless legs syndrome: Secondary | ICD-10-CM | POA: Diagnosis not present

## 2016-05-29 DIAGNOSIS — M1990 Unspecified osteoarthritis, unspecified site: Secondary | ICD-10-CM | POA: Diagnosis not present

## 2016-05-29 DIAGNOSIS — B0229 Other postherpetic nervous system involvement: Secondary | ICD-10-CM | POA: Diagnosis not present

## 2016-05-29 DIAGNOSIS — R009 Unspecified abnormalities of heart beat: Secondary | ICD-10-CM | POA: Diagnosis not present

## 2016-05-29 DIAGNOSIS — I10 Essential (primary) hypertension: Secondary | ICD-10-CM | POA: Diagnosis not present

## 2016-05-29 DIAGNOSIS — S32401D Unspecified fracture of right acetabulum, subsequent encounter for fracture with routine healing: Secondary | ICD-10-CM | POA: Diagnosis not present

## 2016-05-31 DIAGNOSIS — R195 Other fecal abnormalities: Secondary | ICD-10-CM | POA: Diagnosis not present

## 2016-05-31 DIAGNOSIS — I10 Essential (primary) hypertension: Secondary | ICD-10-CM | POA: Diagnosis not present

## 2016-05-31 DIAGNOSIS — R1310 Dysphagia, unspecified: Secondary | ICD-10-CM | POA: Diagnosis not present

## 2016-05-31 DIAGNOSIS — R009 Unspecified abnormalities of heart beat: Secondary | ICD-10-CM | POA: Diagnosis not present

## 2016-05-31 DIAGNOSIS — B0229 Other postherpetic nervous system involvement: Secondary | ICD-10-CM | POA: Diagnosis not present

## 2016-05-31 DIAGNOSIS — G2581 Restless legs syndrome: Secondary | ICD-10-CM | POA: Diagnosis not present

## 2016-05-31 DIAGNOSIS — S32401D Unspecified fracture of right acetabulum, subsequent encounter for fracture with routine healing: Secondary | ICD-10-CM | POA: Diagnosis not present

## 2016-05-31 DIAGNOSIS — D500 Iron deficiency anemia secondary to blood loss (chronic): Secondary | ICD-10-CM | POA: Diagnosis not present

## 2016-05-31 DIAGNOSIS — M1990 Unspecified osteoarthritis, unspecified site: Secondary | ICD-10-CM | POA: Diagnosis not present

## 2016-06-05 DIAGNOSIS — S32401D Unspecified fracture of right acetabulum, subsequent encounter for fracture with routine healing: Secondary | ICD-10-CM | POA: Diagnosis not present

## 2016-06-05 DIAGNOSIS — G2581 Restless legs syndrome: Secondary | ICD-10-CM | POA: Diagnosis not present

## 2016-06-05 DIAGNOSIS — R009 Unspecified abnormalities of heart beat: Secondary | ICD-10-CM | POA: Diagnosis not present

## 2016-06-05 DIAGNOSIS — B0229 Other postherpetic nervous system involvement: Secondary | ICD-10-CM | POA: Diagnosis not present

## 2016-06-05 DIAGNOSIS — M1990 Unspecified osteoarthritis, unspecified site: Secondary | ICD-10-CM | POA: Diagnosis not present

## 2016-06-05 DIAGNOSIS — I10 Essential (primary) hypertension: Secondary | ICD-10-CM | POA: Diagnosis not present

## 2016-06-07 DIAGNOSIS — I10 Essential (primary) hypertension: Secondary | ICD-10-CM | POA: Diagnosis not present

## 2016-06-07 DIAGNOSIS — G2581 Restless legs syndrome: Secondary | ICD-10-CM | POA: Diagnosis not present

## 2016-06-07 DIAGNOSIS — R009 Unspecified abnormalities of heart beat: Secondary | ICD-10-CM | POA: Diagnosis not present

## 2016-06-07 DIAGNOSIS — M1990 Unspecified osteoarthritis, unspecified site: Secondary | ICD-10-CM | POA: Diagnosis not present

## 2016-06-07 DIAGNOSIS — S32401D Unspecified fracture of right acetabulum, subsequent encounter for fracture with routine healing: Secondary | ICD-10-CM | POA: Diagnosis not present

## 2016-06-07 DIAGNOSIS — B0229 Other postherpetic nervous system involvement: Secondary | ICD-10-CM | POA: Diagnosis not present

## 2016-06-19 DIAGNOSIS — I4510 Unspecified right bundle-branch block: Secondary | ICD-10-CM | POA: Diagnosis not present

## 2016-06-19 DIAGNOSIS — I491 Atrial premature depolarization: Secondary | ICD-10-CM | POA: Diagnosis not present

## 2016-06-19 DIAGNOSIS — I10 Essential (primary) hypertension: Secondary | ICD-10-CM | POA: Diagnosis not present

## 2016-07-02 DIAGNOSIS — H353130 Nonexudative age-related macular degeneration, bilateral, stage unspecified: Secondary | ICD-10-CM | POA: Diagnosis not present

## 2016-07-02 DIAGNOSIS — Z23 Encounter for immunization: Secondary | ICD-10-CM | POA: Diagnosis not present

## 2016-07-02 DIAGNOSIS — H524 Presbyopia: Secondary | ICD-10-CM | POA: Diagnosis not present

## 2016-07-02 DIAGNOSIS — Z961 Presence of intraocular lens: Secondary | ICD-10-CM | POA: Diagnosis not present

## 2016-07-04 DIAGNOSIS — M7061 Trochanteric bursitis, right hip: Secondary | ICD-10-CM | POA: Diagnosis not present

## 2016-07-04 DIAGNOSIS — M25551 Pain in right hip: Secondary | ICD-10-CM | POA: Diagnosis not present

## 2016-07-04 DIAGNOSIS — M978XXD Periprosthetic fracture around other internal prosthetic joint, subsequent encounter: Secondary | ICD-10-CM | POA: Diagnosis not present

## 2016-07-07 DIAGNOSIS — J042 Acute laryngotracheitis: Secondary | ICD-10-CM | POA: Diagnosis not present

## 2016-07-07 DIAGNOSIS — H66002 Acute suppurative otitis media without spontaneous rupture of ear drum, left ear: Secondary | ICD-10-CM | POA: Diagnosis not present

## 2016-07-12 DIAGNOSIS — D500 Iron deficiency anemia secondary to blood loss (chronic): Secondary | ICD-10-CM | POA: Diagnosis not present

## 2016-07-12 DIAGNOSIS — K921 Melena: Secondary | ICD-10-CM | POA: Diagnosis not present

## 2016-07-12 DIAGNOSIS — K922 Gastrointestinal hemorrhage, unspecified: Secondary | ICD-10-CM | POA: Diagnosis not present

## 2016-07-12 DIAGNOSIS — D509 Iron deficiency anemia, unspecified: Secondary | ICD-10-CM | POA: Diagnosis not present

## 2016-07-12 DIAGNOSIS — K449 Diaphragmatic hernia without obstruction or gangrene: Secondary | ICD-10-CM | POA: Diagnosis not present

## 2016-07-12 DIAGNOSIS — R195 Other fecal abnormalities: Secondary | ICD-10-CM | POA: Diagnosis not present

## 2016-07-12 DIAGNOSIS — K5730 Diverticulosis of large intestine without perforation or abscess without bleeding: Secondary | ICD-10-CM | POA: Diagnosis not present

## 2016-07-18 IMAGING — CR DG LUMBAR SPINE COMPLETE 4+V
5 series · 5 of 5 positions shown · non-contrast
Comparison: None.

CLINICAL DATA: Fall fall days ago. Low back pain radiating to right
hip.

EXAM:
LUMBAR SPINE - COMPLETE 4+ VIEW

[t lumbar spine obl (1 of 2)]
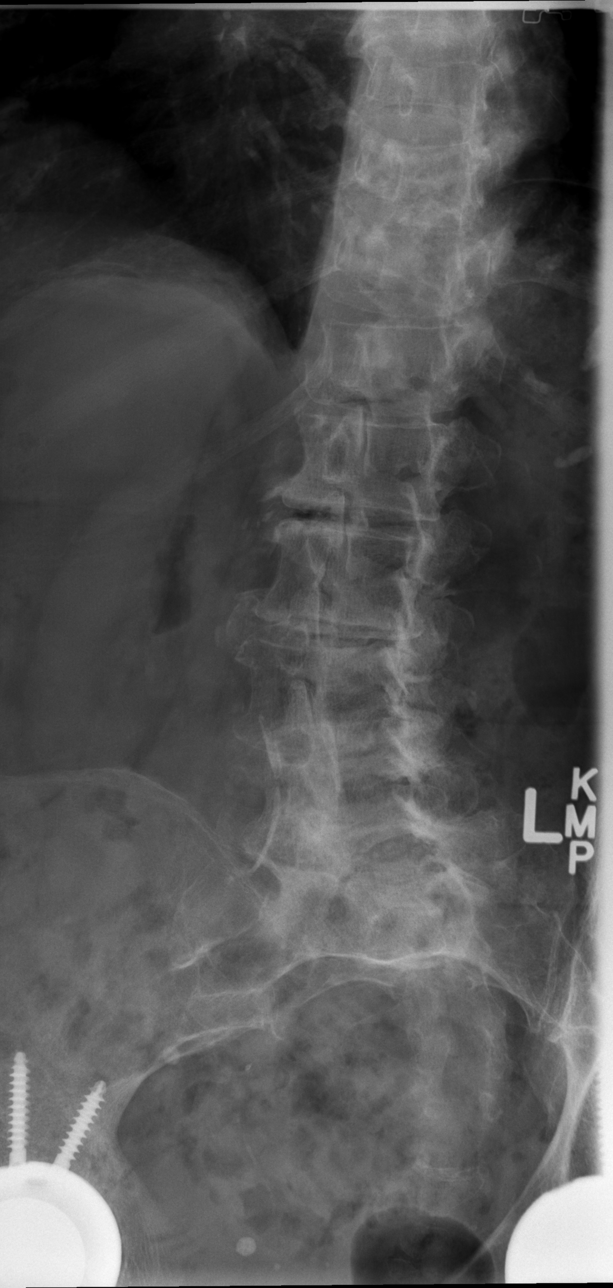

[t lumbar spine ap]
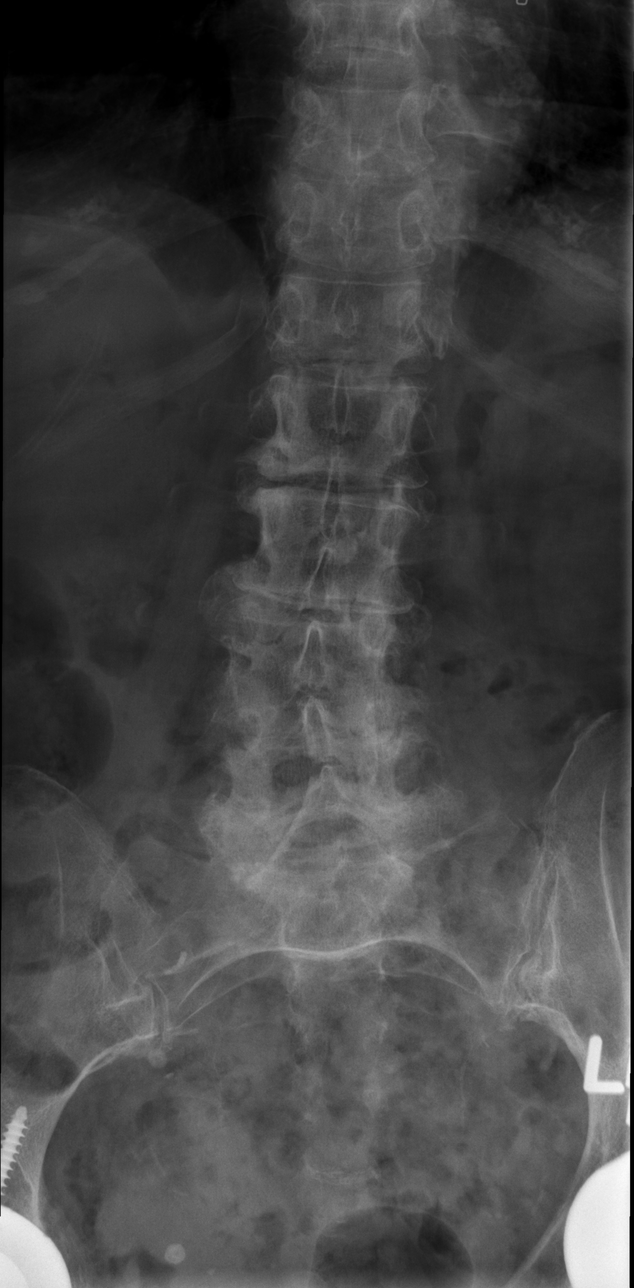

[t lumbar spine obl (2 of 2)]
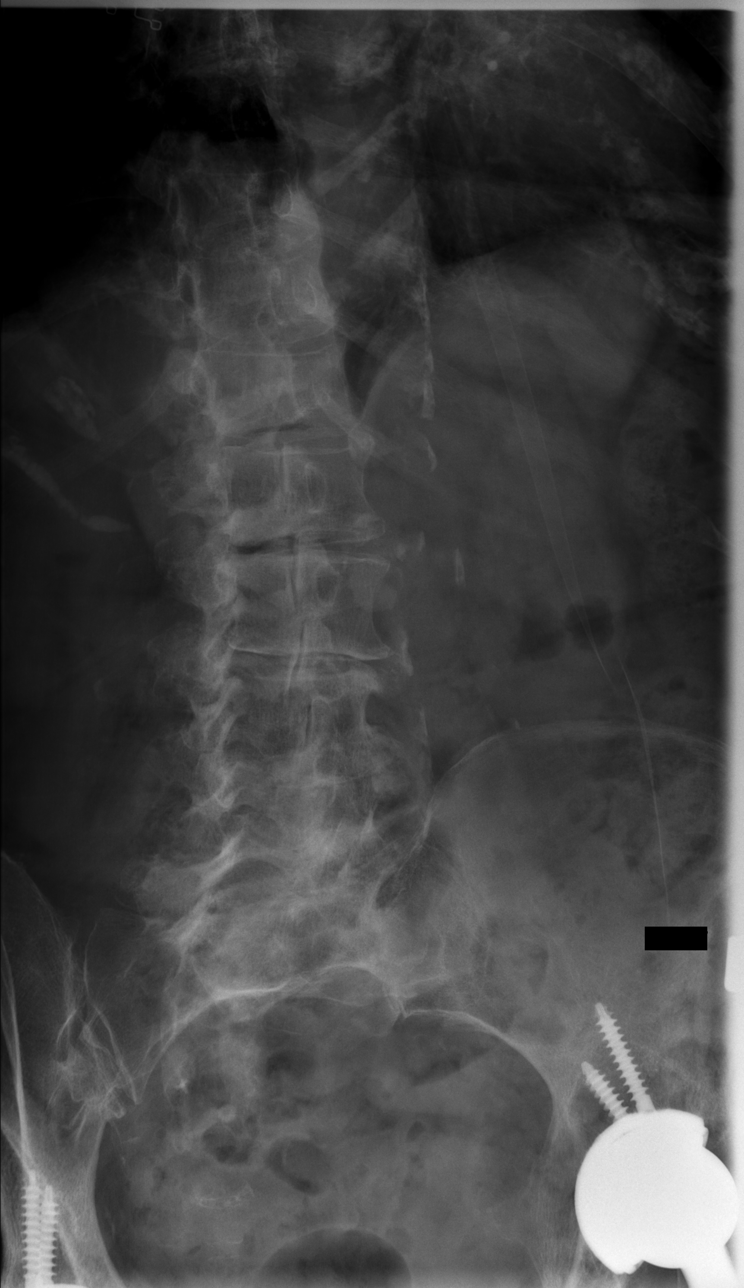

[t lumbar spine lat]
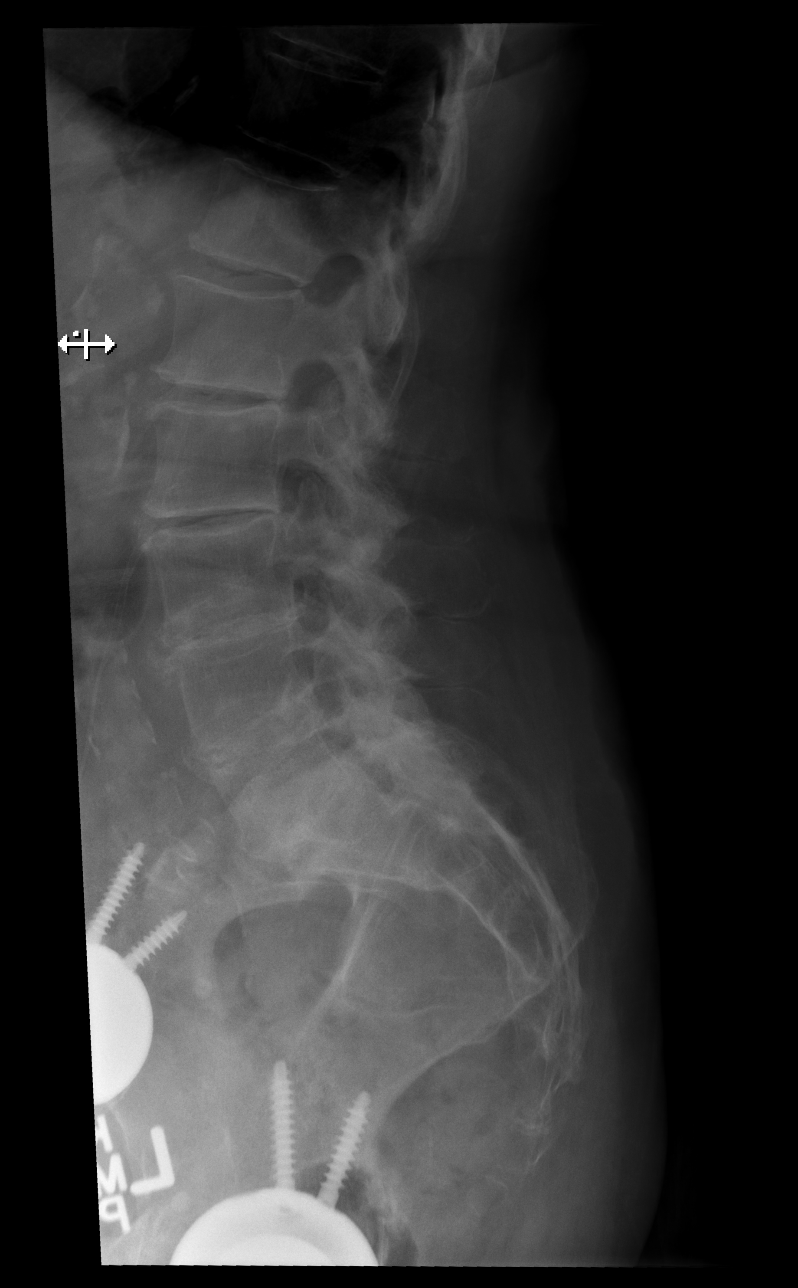

[t lumbar l-5 s-1 spot]
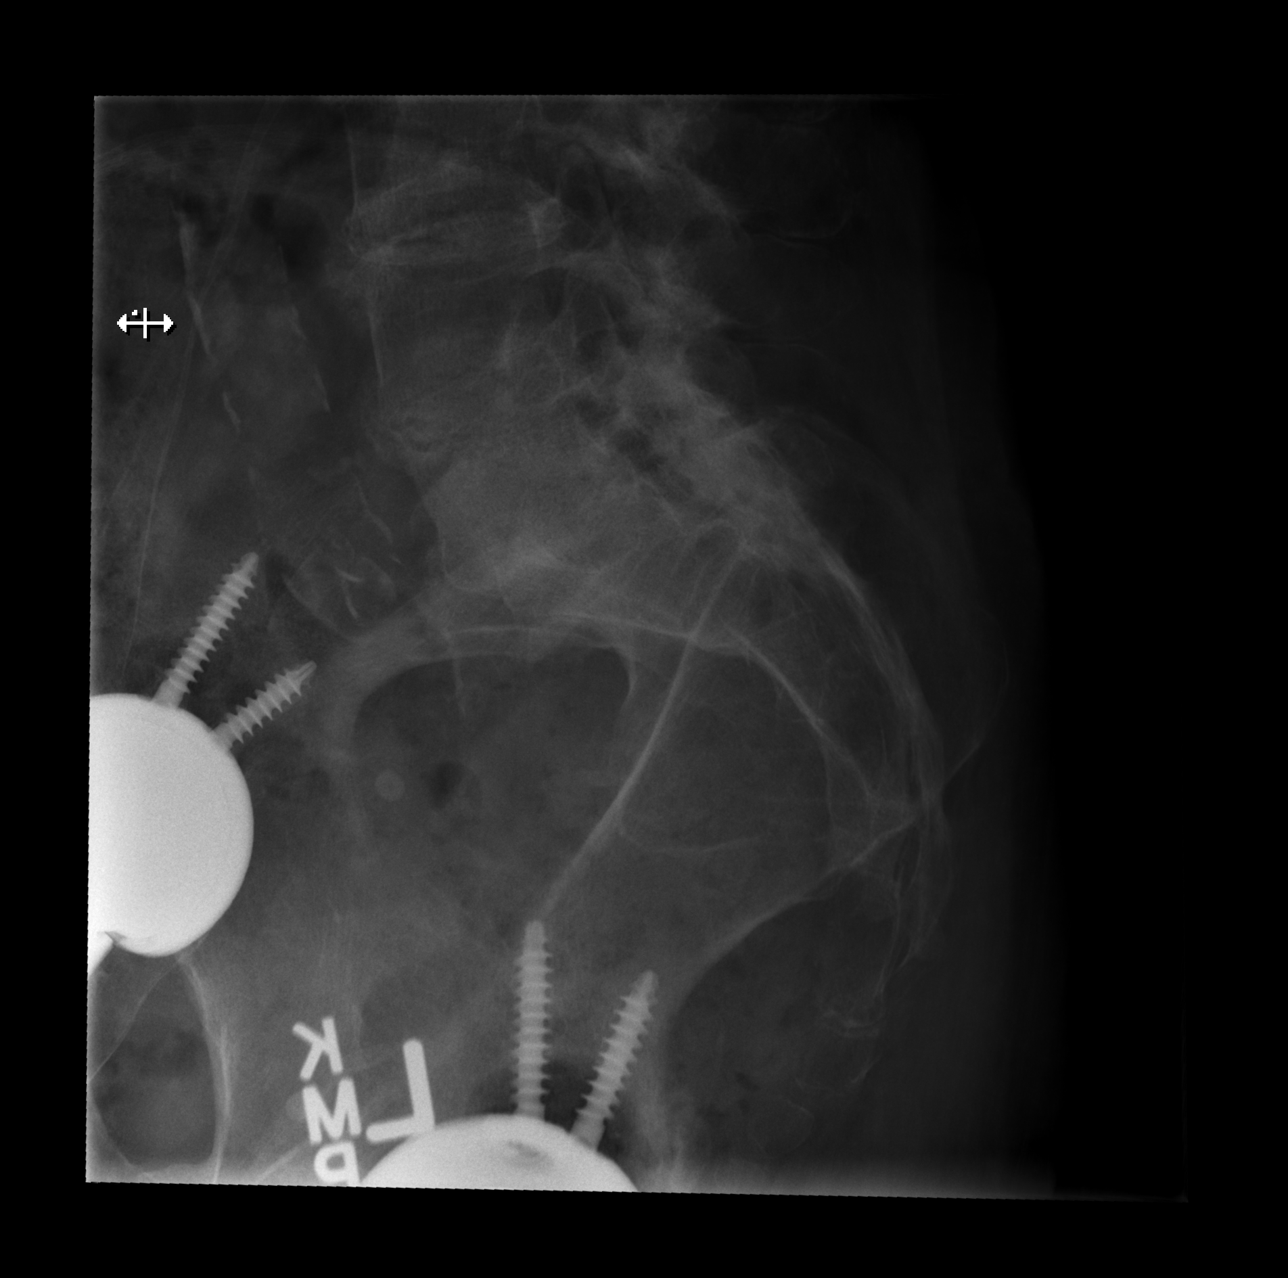

[5 of 5 positions shown; findings below may reference images not displayed]

FINDINGS: No significant joint space narrowing at L3-L4, L4-L5, L5-S1. No
acute loss vertebral body height. No subluxation. No pars fracture
evident.
IMPRESSION: 1. No acute findings lumbar spine by radiography.
2. Severe disc osteophytic disease from L3 through S1

## 2016-07-23 DIAGNOSIS — D539 Nutritional anemia, unspecified: Secondary | ICD-10-CM | POA: Diagnosis not present

## 2016-07-23 DIAGNOSIS — Z85828 Personal history of other malignant neoplasm of skin: Secondary | ICD-10-CM | POA: Diagnosis not present

## 2016-07-23 DIAGNOSIS — Z23 Encounter for immunization: Secondary | ICD-10-CM | POA: Diagnosis not present

## 2016-07-23 DIAGNOSIS — Z1283 Encounter for screening for malignant neoplasm of skin: Secondary | ICD-10-CM | POA: Diagnosis not present

## 2016-07-23 DIAGNOSIS — Z9889 Other specified postprocedural states: Secondary | ICD-10-CM | POA: Diagnosis not present

## 2016-07-23 DIAGNOSIS — I10 Essential (primary) hypertension: Secondary | ICD-10-CM | POA: Diagnosis not present

## 2016-07-23 DIAGNOSIS — D508 Other iron deficiency anemias: Secondary | ICD-10-CM | POA: Diagnosis not present

## 2016-07-26 DIAGNOSIS — H353112 Nonexudative age-related macular degeneration, right eye, intermediate dry stage: Secondary | ICD-10-CM | POA: Diagnosis not present

## 2016-07-26 DIAGNOSIS — H353122 Nonexudative age-related macular degeneration, left eye, intermediate dry stage: Secondary | ICD-10-CM | POA: Diagnosis not present

## 2016-08-23 DIAGNOSIS — G2581 Restless legs syndrome: Secondary | ICD-10-CM | POA: Diagnosis not present

## 2016-08-23 DIAGNOSIS — E039 Hypothyroidism, unspecified: Secondary | ICD-10-CM | POA: Diagnosis not present

## 2016-08-23 DIAGNOSIS — I10 Essential (primary) hypertension: Secondary | ICD-10-CM | POA: Diagnosis not present

## 2016-08-23 DIAGNOSIS — D508 Other iron deficiency anemias: Secondary | ICD-10-CM | POA: Diagnosis not present

## 2016-08-23 DIAGNOSIS — Z0000 Encounter for general adult medical examination without abnormal findings: Secondary | ICD-10-CM | POA: Diagnosis not present

## 2016-09-18 DIAGNOSIS — Z85828 Personal history of other malignant neoplasm of skin: Secondary | ICD-10-CM | POA: Diagnosis not present

## 2016-09-18 DIAGNOSIS — L300 Nummular dermatitis: Secondary | ICD-10-CM | POA: Diagnosis not present

## 2016-09-18 DIAGNOSIS — Z08 Encounter for follow-up examination after completed treatment for malignant neoplasm: Secondary | ICD-10-CM | POA: Diagnosis not present

## 2016-09-21 DIAGNOSIS — M5416 Radiculopathy, lumbar region: Secondary | ICD-10-CM | POA: Diagnosis not present

## 2016-09-21 DIAGNOSIS — M5136 Other intervertebral disc degeneration, lumbar region: Secondary | ICD-10-CM | POA: Diagnosis not present

## 2016-09-21 DIAGNOSIS — M4726 Other spondylosis with radiculopathy, lumbar region: Secondary | ICD-10-CM | POA: Diagnosis not present

## 2016-10-01 ENCOUNTER — Other Ambulatory Visit: Payer: Self-pay | Admitting: Family Medicine

## 2016-12-04 ENCOUNTER — Telehealth: Payer: Self-pay

## 2016-12-04 NOTE — Telephone Encounter (Signed)
Call for [facility] and is no longer coming to this practice. Is seeing another doctor now closer to home.

## 2016-12-12 DIAGNOSIS — M5441 Lumbago with sciatica, right side: Secondary | ICD-10-CM | POA: Diagnosis not present

## 2016-12-12 DIAGNOSIS — I10 Essential (primary) hypertension: Secondary | ICD-10-CM | POA: Diagnosis not present

## 2016-12-27 DIAGNOSIS — G2581 Restless legs syndrome: Secondary | ICD-10-CM | POA: Diagnosis not present

## 2016-12-27 DIAGNOSIS — D509 Iron deficiency anemia, unspecified: Secondary | ICD-10-CM | POA: Diagnosis not present

## 2016-12-27 DIAGNOSIS — Z780 Asymptomatic menopausal state: Secondary | ICD-10-CM | POA: Diagnosis not present

## 2016-12-27 DIAGNOSIS — Z8639 Personal history of other endocrine, nutritional and metabolic disease: Secondary | ICD-10-CM | POA: Diagnosis not present

## 2016-12-27 DIAGNOSIS — E039 Hypothyroidism, unspecified: Secondary | ICD-10-CM | POA: Diagnosis not present

## 2016-12-27 DIAGNOSIS — N390 Urinary tract infection, site not specified: Secondary | ICD-10-CM | POA: Diagnosis not present

## 2016-12-27 DIAGNOSIS — I10 Essential (primary) hypertension: Secondary | ICD-10-CM | POA: Diagnosis not present

## 2016-12-27 DIAGNOSIS — R319 Hematuria, unspecified: Secondary | ICD-10-CM | POA: Diagnosis not present

## 2016-12-27 DIAGNOSIS — N39498 Other specified urinary incontinence: Secondary | ICD-10-CM | POA: Diagnosis not present

## 2017-01-03 DIAGNOSIS — Z780 Asymptomatic menopausal state: Secondary | ICD-10-CM | POA: Diagnosis not present

## 2017-01-03 DIAGNOSIS — M810 Age-related osteoporosis without current pathological fracture: Secondary | ICD-10-CM | POA: Diagnosis not present

## 2017-01-23 DIAGNOSIS — H353132 Nonexudative age-related macular degeneration, bilateral, intermediate dry stage: Secondary | ICD-10-CM | POA: Diagnosis not present

## 2017-01-25 DIAGNOSIS — M810 Age-related osteoporosis without current pathological fracture: Secondary | ICD-10-CM | POA: Diagnosis not present

## 2017-01-25 DIAGNOSIS — Z87440 Personal history of urinary (tract) infections: Secondary | ICD-10-CM | POA: Diagnosis not present

## 2017-01-25 DIAGNOSIS — I10 Essential (primary) hypertension: Secondary | ICD-10-CM | POA: Diagnosis not present

## 2017-01-25 DIAGNOSIS — M2170 Unequal limb length (acquired), unspecified site: Secondary | ICD-10-CM | POA: Diagnosis not present

## 2017-01-25 DIAGNOSIS — Z96641 Presence of right artificial hip joint: Secondary | ICD-10-CM | POA: Diagnosis not present

## 2017-01-25 DIAGNOSIS — Z862 Personal history of diseases of the blood and blood-forming organs and certain disorders involving the immune mechanism: Secondary | ICD-10-CM | POA: Diagnosis not present

## 2017-01-25 DIAGNOSIS — N3000 Acute cystitis without hematuria: Secondary | ICD-10-CM | POA: Diagnosis not present

## 2017-03-01 DIAGNOSIS — N3941 Urge incontinence: Secondary | ICD-10-CM | POA: Diagnosis not present

## 2017-03-01 DIAGNOSIS — I10 Essential (primary) hypertension: Secondary | ICD-10-CM | POA: Diagnosis not present

## 2017-03-01 DIAGNOSIS — R29898 Other symptoms and signs involving the musculoskeletal system: Secondary | ICD-10-CM | POA: Diagnosis not present

## 2017-03-15 DIAGNOSIS — R32 Unspecified urinary incontinence: Secondary | ICD-10-CM | POA: Diagnosis not present

## 2017-03-15 DIAGNOSIS — I10 Essential (primary) hypertension: Secondary | ICD-10-CM | POA: Diagnosis not present

## 2017-03-15 DIAGNOSIS — N390 Urinary tract infection, site not specified: Secondary | ICD-10-CM | POA: Diagnosis not present

## 2017-03-27 DIAGNOSIS — N390 Urinary tract infection, site not specified: Secondary | ICD-10-CM | POA: Diagnosis not present

## 2017-04-10 DIAGNOSIS — M5440 Lumbago with sciatica, unspecified side: Secondary | ICD-10-CM | POA: Diagnosis not present

## 2017-04-26 DIAGNOSIS — R32 Unspecified urinary incontinence: Secondary | ICD-10-CM | POA: Diagnosis not present

## 2017-04-26 DIAGNOSIS — N390 Urinary tract infection, site not specified: Secondary | ICD-10-CM | POA: Diagnosis not present

## 2017-04-26 DIAGNOSIS — I10 Essential (primary) hypertension: Secondary | ICD-10-CM | POA: Diagnosis not present

## 2017-05-03 DIAGNOSIS — G2581 Restless legs syndrome: Secondary | ICD-10-CM | POA: Diagnosis not present

## 2017-05-03 DIAGNOSIS — Z862 Personal history of diseases of the blood and blood-forming organs and certain disorders involving the immune mechanism: Secondary | ICD-10-CM | POA: Diagnosis not present

## 2017-05-03 DIAGNOSIS — I10 Essential (primary) hypertension: Secondary | ICD-10-CM | POA: Diagnosis not present

## 2017-05-15 DIAGNOSIS — H353132 Nonexudative age-related macular degeneration, bilateral, intermediate dry stage: Secondary | ICD-10-CM | POA: Diagnosis not present

## 2017-05-15 DIAGNOSIS — H43813 Vitreous degeneration, bilateral: Secondary | ICD-10-CM | POA: Diagnosis not present

## 2017-05-17 DIAGNOSIS — M5416 Radiculopathy, lumbar region: Secondary | ICD-10-CM | POA: Diagnosis not present

## 2017-05-21 DIAGNOSIS — N390 Urinary tract infection, site not specified: Secondary | ICD-10-CM | POA: Diagnosis not present

## 2017-05-21 DIAGNOSIS — Z87440 Personal history of urinary (tract) infections: Secondary | ICD-10-CM | POA: Diagnosis not present

## 2017-05-21 DIAGNOSIS — R319 Hematuria, unspecified: Secondary | ICD-10-CM | POA: Diagnosis not present

## 2017-07-03 DIAGNOSIS — Z87440 Personal history of urinary (tract) infections: Secondary | ICD-10-CM | POA: Diagnosis not present

## 2017-07-03 DIAGNOSIS — Z23 Encounter for immunization: Secondary | ICD-10-CM | POA: Diagnosis not present

## 2017-07-04 ENCOUNTER — Encounter: Payer: Self-pay | Admitting: Family Medicine

## 2017-07-05 DIAGNOSIS — R8299 Other abnormal findings in urine: Secondary | ICD-10-CM | POA: Diagnosis not present

## 2017-07-05 DIAGNOSIS — I10 Essential (primary) hypertension: Secondary | ICD-10-CM | POA: Diagnosis not present

## 2017-07-05 DIAGNOSIS — R300 Dysuria: Secondary | ICD-10-CM | POA: Diagnosis not present

## 2017-07-05 DIAGNOSIS — N3000 Acute cystitis without hematuria: Secondary | ICD-10-CM | POA: Diagnosis not present

## 2017-07-10 DIAGNOSIS — H353132 Nonexudative age-related macular degeneration, bilateral, intermediate dry stage: Secondary | ICD-10-CM | POA: Diagnosis not present

## 2017-07-10 DIAGNOSIS — H52201 Unspecified astigmatism, right eye: Secondary | ICD-10-CM | POA: Diagnosis not present

## 2017-07-10 DIAGNOSIS — Z961 Presence of intraocular lens: Secondary | ICD-10-CM | POA: Diagnosis not present

## 2017-07-30 DIAGNOSIS — R32 Unspecified urinary incontinence: Secondary | ICD-10-CM | POA: Diagnosis not present

## 2017-07-30 DIAGNOSIS — N390 Urinary tract infection, site not specified: Secondary | ICD-10-CM | POA: Diagnosis not present

## 2017-07-30 DIAGNOSIS — I10 Essential (primary) hypertension: Secondary | ICD-10-CM | POA: Diagnosis not present

## 2017-08-05 DIAGNOSIS — D509 Iron deficiency anemia, unspecified: Secondary | ICD-10-CM | POA: Diagnosis not present

## 2017-08-05 DIAGNOSIS — D709 Neutropenia, unspecified: Secondary | ICD-10-CM | POA: Diagnosis not present

## 2017-08-27 DIAGNOSIS — E559 Vitamin D deficiency, unspecified: Secondary | ICD-10-CM | POA: Diagnosis not present

## 2017-08-27 DIAGNOSIS — Z0000 Encounter for general adult medical examination without abnormal findings: Secondary | ICD-10-CM | POA: Diagnosis not present

## 2017-08-27 DIAGNOSIS — I10 Essential (primary) hypertension: Secondary | ICD-10-CM | POA: Diagnosis not present

## 2017-09-17 DIAGNOSIS — H43813 Vitreous degeneration, bilateral: Secondary | ICD-10-CM | POA: Diagnosis not present

## 2017-09-17 DIAGNOSIS — H353132 Nonexudative age-related macular degeneration, bilateral, intermediate dry stage: Secondary | ICD-10-CM | POA: Diagnosis not present

## 2017-10-14 DIAGNOSIS — R911 Solitary pulmonary nodule: Secondary | ICD-10-CM | POA: Diagnosis not present

## 2017-10-14 DIAGNOSIS — Z880 Allergy status to penicillin: Secondary | ICD-10-CM | POA: Diagnosis not present

## 2017-10-14 DIAGNOSIS — Z79899 Other long term (current) drug therapy: Secondary | ICD-10-CM | POA: Diagnosis not present

## 2017-10-14 DIAGNOSIS — I10 Essential (primary) hypertension: Secondary | ICD-10-CM | POA: Diagnosis not present

## 2017-10-14 DIAGNOSIS — Z87891 Personal history of nicotine dependence: Secondary | ICD-10-CM | POA: Diagnosis not present

## 2017-10-14 DIAGNOSIS — R079 Chest pain, unspecified: Secondary | ICD-10-CM | POA: Diagnosis not present

## 2017-10-14 DIAGNOSIS — I4510 Unspecified right bundle-branch block: Secondary | ICD-10-CM | POA: Diagnosis not present

## 2017-10-14 DIAGNOSIS — Z881 Allergy status to other antibiotic agents status: Secondary | ICD-10-CM | POA: Diagnosis not present

## 2017-10-14 DIAGNOSIS — R0789 Other chest pain: Secondary | ICD-10-CM | POA: Diagnosis not present

## 2017-10-14 DIAGNOSIS — R918 Other nonspecific abnormal finding of lung field: Secondary | ICD-10-CM | POA: Diagnosis not present

## 2017-10-14 DIAGNOSIS — M4854XA Collapsed vertebra, not elsewhere classified, thoracic region, initial encounter for fracture: Secondary | ICD-10-CM | POA: Diagnosis not present

## 2017-10-14 DIAGNOSIS — R070 Pain in throat: Secondary | ICD-10-CM | POA: Diagnosis not present

## 2017-10-14 DIAGNOSIS — I2510 Atherosclerotic heart disease of native coronary artery without angina pectoris: Secondary | ICD-10-CM | POA: Diagnosis not present

## 2017-10-14 DIAGNOSIS — Z885 Allergy status to narcotic agent status: Secondary | ICD-10-CM | POA: Diagnosis not present

## 2017-10-14 DIAGNOSIS — R05 Cough: Secondary | ICD-10-CM | POA: Diagnosis not present

## 2018-01-07 ENCOUNTER — Encounter: Payer: Self-pay | Admitting: Gastroenterology

## 2020-03-12 ENCOUNTER — Emergency Department (HOSPITAL_COMMUNITY)
Admission: EM | Admit: 2020-03-12 | Discharge: 2020-03-12 | Disposition: A | Discharge status: Home / Self Care | Payer: Medicare Other | Attending: Emergency Medicine | Admitting: Emergency Medicine

## 2020-03-12 ENCOUNTER — Other Ambulatory Visit: Payer: Self-pay

## 2020-03-12 ENCOUNTER — Telehealth (HOSPITAL_COMMUNITY): Payer: Self-pay | Admitting: Physician Assistant

## 2020-03-12 ENCOUNTER — Emergency Department (HOSPITAL_COMMUNITY): Payer: Medicare Other

## 2020-03-12 DIAGNOSIS — I10 Essential (primary) hypertension: Secondary | ICD-10-CM | POA: Insufficient documentation

## 2020-03-12 DIAGNOSIS — E039 Hypothyroidism, unspecified: Secondary | ICD-10-CM | POA: Diagnosis not present

## 2020-03-12 DIAGNOSIS — Z96642 Presence of left artificial hip joint: Secondary | ICD-10-CM | POA: Insufficient documentation

## 2020-03-12 DIAGNOSIS — M25552 Pain in left hip: Secondary | ICD-10-CM | POA: Diagnosis present

## 2020-03-12 DIAGNOSIS — Z96641 Presence of right artificial hip joint: Secondary | ICD-10-CM | POA: Diagnosis not present

## 2020-03-12 DIAGNOSIS — Z79899 Other long term (current) drug therapy: Secondary | ICD-10-CM | POA: Diagnosis not present

## 2020-03-12 DIAGNOSIS — Z87891 Personal history of nicotine dependence: Secondary | ICD-10-CM | POA: Insufficient documentation

## 2020-03-12 DIAGNOSIS — M545 Low back pain: Secondary | ICD-10-CM | POA: Diagnosis not present

## 2020-03-12 DIAGNOSIS — M5432 Sciatica, left side: Secondary | ICD-10-CM | POA: Diagnosis not present

## 2020-03-12 MED ORDER — IBUPROFEN 400 MG PO TABS: 400.0000 mg | ORAL_TABLET | Freq: Four times a day (QID) | ORAL | 0 refills | Status: DC | PRN

## 2020-03-12 MED ORDER — PREDNISONE 10 MG PO TABS: ORAL_TABLET | ORAL | 0 refills | Status: AC

## 2020-03-12 MED ORDER — IBUPROFEN 400 MG PO TABS
400.0000 mg | ORAL_TABLET | Freq: Four times a day (QID) | ORAL | 0 refills | Status: DC | PRN
Start: 2020-03-12 — End: 2024-08-30

## 2020-03-12 MED ORDER — PREDNISONE 10 MG PO TABS
ORAL_TABLET | ORAL | 0 refills | Status: DC
Start: 2020-03-13 — End: 2020-03-12

## 2020-03-12 MED ORDER — PREDNISONE 20 MG PO TABS
40.0000 mg | ORAL_TABLET | Freq: Once | ORAL | Status: AC
  Administered 2020-03-12: 40 mg via ORAL
  Filled 2020-03-12: qty 2

## 2020-03-12 MED ORDER — IBUPROFEN 200 MG PO TABS
400.0000 mg | ORAL_TABLET | Freq: Once | ORAL | Status: AC
  Administered 2020-03-12: 400 mg via ORAL
  Filled 2020-03-12: qty 2

## 2020-03-12 NOTE — Telephone Encounter (Cosign Needed)
RN Narda Rutherford informed me that patient's pharmacy received prescription for ibuprofen but not prednisone.  Her husband called requesting that the prednisone prescription be sent to the patient's pharmacy.  I resent the prescription for prednisone to the patient's pharmacy [facility] in [place].

## 2020-03-12 NOTE — ED Provider Notes (Signed)
[facility] DEPT Provider Note   CSN: [id number] Arrival date & time: 03/12/20  1131     History Chief Complaint  Patient presents with  . Hip Pain    left    [facility] is a 86 y.o. female history of chronic lower back pain, bilateral hip replacement, presented emergency department with pain radiating down her left leg.  Patient reports onset of symptoms gradually about 3 to 4 days ago.  She does symptoms persistently gotten worse.  She felt she could not tolerated today.  She said normally she can walk a block or 2 at baseline, sometimes does use a walker.  However the pain is limited her mobility today.  She did take 1 dose of ibuprofen yesterday which helped her pain somewhat, although she tries to avoid NSAIDs due to an upset stomach.  Prior to the onset of her symptoms, she denies any falls or trauma.  She reports that the pain begins in her left lower back and hip and radiates down to her left mid leg.  There is no numbness in her toes.  No numbness around her groin.  No urinary incontinence.  She has not had any falls.  She does not feel that her leg is weak, but reports her mobility is limited due to pain in her hip.  She does follow with a neurosurgeon for scoliosis and chronic lower back and spine issues.  She says she gets periodic injections into her spine.  Her next appointment is June 30 with the neurosurgeon.  HPI     Past Medical History:  Diagnosis Date  . Arthritis   . Complication of anesthesia   . Difficulty sleeping    TAKES TYLENOL PM NIGHTLY  . Diverticulosis   . GERD (gastroesophageal reflux disease)    ONLY OCCASIONAL  . History of shingles   . Hyperlipidemia   . Hypertension   . Osteopenia   . PONV (postoperative nausea and vomiting)   . Precancerous skin lesion   . Shortness of breath    WITH EXERTION  . Thyroid disease     Patient Active Problem List   Diagnosis Date Noted  . RLS (restless legs syndrome)  01/13/2015  . Hyponatremia 10/10/2014  . Sacral fracture, closed ([place]) 10/08/2014  . 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 Osteopenia 02/07/2012  . CHRONIC LARYNGITIS 09/26/2010  . DYSPRAXIA 05/04/2010  . WEAKNESS 05/04/2010  . 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 SHINGLES 01/23/2010  . ECZEMA 09/12/2009  . Hypothyroidism 03/10/2009  . OSTEOARTHRITIS 03/10/2009  . HYPERLIPIDEMIA 03/04/2009  . Essential hypertension 03/04/2009    Past Surgical History:  Procedure Laterality Date  . ABDOMINAL HYSTERECTOMY    . BACK SURGERY    . CATARACT EXTRACTION    . COLON SURGERY  1983   COLON RESECTION  . JOINT REPLACEMENT     TOTAL KNEE 2012 / BIL TOTAL HIPS   . LUMBAR LAMINECTOMY    . TONSILLECTOMY    . TOTAL HIP ARTHROPLASTY  1999 / 2007   BILATERAL  . TOTAL HIP REVISION Right 08/02/2014   Procedure: RIGHT TOTAL HIP REVISION;  Surgeon: Mauri Pole, MD;  Location: [facility];  Service: Orthopedics;  Laterality: Right;     OB History   No obstetric history on file.     Family History  Problem Relation Age of Onset  . Hypertension Unknown        fhx  . Cancer Unknown  prostate/fhx    Social History   Tobacco Use  . Smoking status: Former Smoker    Quit date: 07/21/1992    Years since quitting: 27.6  Substance Use Topics  . Alcohol use: No  . Drug use: No    Home Medications Prior to Admission medications   Medication Sig Start Date End Date Taking? Authorizing Provider  acetaminophen (TYLENOL) 325 MG tablet Take 2 tablets (650 mg total) by mouth every 6 (six) hours as needed for mild pain (or Fever >/= 101). 08/05/14   Danae Orleans, PA-C  amLODipine (NORVASC) 2.5 MG tablet Take 1 tablet (2.5 mg total) by mouth daily. 10/21/15   Burchette, Alinda Sierras, MD  benzonatate (TESSALON) 100 MG capsule Take 1 capsule (100 mg total) by mouth 3 (three) times daily as needed for cough. 01/17/16   Burchette, Alinda Sierras, MD  Calcium Carbonate-Vit D-Min (CALCIUM 1200 PO) Take by mouth.     [provider]  Carboxymethylcell-Hypromellose (GENTEAL OP) Apply 1 drop to eye at bedtime.    [provider]  cetirizine (ZYRTEC) 10 MG tablet Take 10 mg by mouth at bedtime.     [provider]  cholecalciferol (VITAMIN D) 1000 UNITS tablet Take 1,000 Units by mouth daily.    [provider]  clobetasol ointment (TEMOVATE) [id number] % Apply 1 application topically 2 (two) times daily as needed (for eczema).    [provider]  clotrimazole-betamethasone (LOTRISONE) cream Apply 1 application topically 2 (two) times daily. Patient taking differently: Apply 1 application topically 2 (two) times daily as needed (itching, eczema).  10/27/15   Burchette, Alinda Sierras, MD  diphenhydrAMINE (BENADRYL) 25 MG tablet Take 25-50 mg by mouth every 6 (six) hours as needed for itching or allergies.     [provider]  doxycycline (VIBRA-TABS) 100 MG tablet Take 1 tablet (100 mg total) by mouth 2 (two) times daily. Patient not taking: Reported on 04/20/2016 01/17/16   Eulas Post, MD  ferrous sulfate 324 (65 Fe) MG TBEC Take 324 mg by mouth daily. 04/16/16   [provider]  gabapentin (NEURONTIN) 300 MG capsule TAKE 1 TO 3 CAPSULES BY MOUTH EVERY DAY AS DIRECTED Patient taking differently: Take 300 mg by mouth 3 (three) times daily.  08/18/15   Dorena Cookey, MD  ibuprofen (ADVIL) 400 MG tablet Take 1 tablet (400 mg total) by mouth every 6 (six) hours as needed for up to 20 doses. 03/12/20   Fawze, Mina A, PA-C  Multiple Vitamins-Minerals (PRESERVISION AREDS 2) CAPS Take 1 capsule by mouth 2 (two) times daily.     [provider]  Omega-3 Fatty Acids (FISH OIL PO) Take 1 capsule by mouth 2 (two) times daily.    [provider]  ondansetron (ZOFRAN ODT) 4 MG disintegrating tablet 4mg  ODT q4 hours prn nausea/vomit 04/20/16   Deno Etienne, DO  ondansetron (ZOFRAN ODT) 4 MG disintegrating tablet 4mg  ODT q4 hours prn nausea/vomit 04/20/16   Deno Etienne, DO  oxyCODONE (ROXICODONE) 5 MG immediate release tablet Take 0.5 tablets (2.5 mg total) by mouth every 4 (four) hours as needed for severe pain. 04/20/16   Deno Etienne, DO  oxyCODONE (ROXICODONE) 5 MG immediate release tablet Take 0.5 tablets (2.5 mg total) by mouth every 4 (four) hours as needed for severe pain. 04/20/16   Deno Etienne, DO  Polyethyl Glycol-Propyl Glycol (SYSTANE OP) Apply 1 drop to eye 3 (three) times daily as needed (for dry eyes).     [provider]  predniSONE (DELTASONE) 10 MG tablet Take 2 tablets (20 mg total) by mouth daily for 2 days, THEN 1 tablet (10 mg total) daily for 3 days. 03/13/20 03/18/20  Rodell Perna A, PA-C  Probiotic Product (ACIDOPHILUS/GOAT MILK) CAPS Take 1 capsule by mouth every evening.    [provider]  rOPINIRole (REQUIP) 0.25 MG tablet Take 1 tablet by mouth at  bedtime Patient taking differently: Take 0.5mg  by mouth at  bedtime for restless legs 03/19/16   Eulas Post, MD  SYNTHROID 125 MCG tablet TAKE 1 TABLET BY MOUTH EVERY MORNING BEFORE BREAKFAST 10/01/16   Burchette, Alinda Sierras, MD  tacrolimus (PROTOPIC) 0.1 % ointment Apply 0.1 % topically daily as needed (for itchy eyelids).     [provider]  vitamin B-12 (CYANOCOBALAMIN) 1000 MCG tablet Take 1,000 mcg by mouth daily.    [provider]    Allergies    Cephalosporins, Codeine sulfate, Hydrocodone, Percocet [oxycodone-acetaminophen], Tramadol, and Penicillins  Review of Systems   Review of Systems  Constitutional: Negative for chills and fever.  Eyes: Negative for photophobia and visual disturbance.  Respiratory: Negative for cough and shortness of breath.   Cardiovascular: Negative for chest pain and palpitations.  Gastrointestinal: Negative for abdominal pain and vomiting.  Genitourinary: Negative for difficulty urinating and hematuria.  Musculoskeletal: Positive for arthralgias, back pain and myalgias.  Skin: Negative for color change and rash.    Neurological: Negative for weakness, light-headedness and numbness.  Psychiatric/Behavioral: Negative for agitation and confusion.  All other systems reviewed and are negative.   Physical Exam Updated Vital Signs BP (!) 151/81 (BP Location: Left Arm)   Pulse (!) 57   Temp 98.3 F (36.8 C) (Oral)   Resp 18   Ht 5\' 3"  (1.6 m)   Wt 69.4 kg   SpO2 98%   BMI 27.10 kg/m   Physical Exam Vitals and nursing note reviewed.  Constitutional:      General: She is not in acute distress.    Appearance: She is well-developed.  HENT:     Head: Normocephalic and atraumatic.  Eyes:     Conjunctiva/sclera: Conjunctivae normal.  Cardiovascular:     Rate and Rhythm: Normal rate and regular rhythm.     Pulses: Normal pulses.  Pulmonary:     Effort: Pulmonary effort is normal. No respiratory distress.  Abdominal:     Palpations: Abdomen is soft.     Tenderness: There is no abdominal tenderness.  Musculoskeletal:        General: No swelling or tenderness.     Cervical back: Neck supple.     Comments: No spinal midline tenderness +Tenderness of iliac crest left hip Pain in left hip with active and passive flexion  Skin:    General: Skin is warm and dry.  Neurological:     General: No focal deficit present.     Mental Status: She is alert and oriented to person, place, and time.     Sensory: No sensory deficit.     Motor: No weakness.     Comments: No saddle anesthesia +Straight leg test in left leg only  Psychiatric:        Mood and Affect: Mood normal.        Behavior: Behavior normal.     ED Results / Procedures / Treatments   Labs (all labs ordered are listed, but only abnormal results are displayed) Labs Reviewed - No data to display  EKG None  Radiology DG  Hip Unilat W or Wo Pelvis 2-3 Views Left  Result Date: 03/12/2020 CLINICAL DATA:  Pain EXAM: DG HIP (WITH OR WITHOUT PELVIS) 2-3V LEFT COMPARISON:  April 20, 2016 FINDINGS: Frontal pelvis as well as frontal and lateral  left hip images were obtained. There is a total hip replacement on each side with prosthetic components bilaterally appearing well seated. No acute fracture or dislocation evident. There is degenerative change in the pubic symphysis region. There is degenerative change in the lower lumbar spine region, stable in appearance. IMPRESSION: Status post total hip replacements bilaterally with prosthetic components bilaterally appearing well seated. No fracture or dislocation. Areas of arthropathy in the lower lumbar spine and pubic symphysis regions. Electronically Signed   By: Lowella Grip III M.D.   On: 03/12/2020 15:10    Procedures Procedures (including critical care time)  Medications Ordered in ED Medications  ibuprofen (ADVIL) tablet 400 mg (400 mg Oral Given 03/12/20 1449)  predniSONE (DELTASONE) tablet 40 mg (40 mg Oral Given 03/12/20 1449)    ED Course  I have reviewed the triage vital signs and the nursing notes.  Pertinent labs & imaging results that were available during my care of the patient were reviewed by me and considered in my medical decision making (see chart for details).  This is a very pleasant 86 year old female presenting to the ED with left hip pain and sciatica for 3 to 4 days.  She has a history of chronic degenerative disc disease in her lower back.  There is no reported falls or trauma.  No evidence of spinal fracture on my exam.  She does have pain in her left hip which is reproducible on exam and also positive straight leg test on the left.  I suspect again this is most likely sciatica.  I do think with her history of osteoporosis, x-rays of the hip would be reasonable to evaluate for occult fracture.  Otherwise do not believe she has any red flags for cauda equina syndrome.  Do not believe she needs an emergent MRI at this time.  I did however advise that she call her neurosurgeon's office on Tuesday and ask for an expedited office appointment.  She does benefit  from localized steroid injections and this may help her again.  We talked about sparing use of NSAIDS for breakthrough pain this week and a short course of PO prednisone, which she agrees with.  She IS able to ambulate currently.  She lives with her husband in senior living.   Clinical Course as of Mar 12 1621  Sat Mar 12, 2020  1516 IMPRESSION: Status post total hip replacements bilaterally with prosthetic components bilaterally appearing well seated. No fracture or dislocation. Areas of arthropathy in the lower lumbar spine and pubic symphysis regions.   [MT]  1516 Okay for discharge with husband   [MT]    Clinical Course User Index [MT] Bryauna Byrum, Carola Rhine, MD    Final Clinical Impression(s) / ED Diagnoses Final diagnoses:  Left hip pain  Sciatica of left side    Rx / DC Orders ED Discharge Orders         Ordered    predniSONE (DELTASONE) 10 MG tablet  Status:  Discontinued     03/12/20 1521    predniSONE (DELTASONE) 10 MG tablet     03/12/20 1553    ibuprofen (ADVIL) 400 MG tablet  Every 6 hours PRN,   Status:  Discontinued     03/12/20 1521    ibuprofen (ADVIL)  400 MG tablet  Every 6 hours PRN     03/12/20 1553           Wyvonnia Dusky, MD 03/12/20 1622

## 2020-03-12 NOTE — Discharge Instructions (Addendum)
Please call your neurosurgeon's office on Tuesday and ask if they can move up your appointment date.  Let them know that you were seen in the Emergency Department for your pain.  At home you can take motrin (ibuprofen) 400 mg every 8 hours for pain.  Take this with food.  Try not to take this for more than 3 or 4 days in a row.  I also prescribed a short course of steroids (prednisone).  Take this by mouth as instructed for the next 5 days.

## 2020-03-12 NOTE — ED Triage Notes (Addendum)
Patient reports her left hip is hurting and she is unable to walk due to pain. Patient has history of left knee and hip replacement. Patient says pain started a few days ago. Patient denies injury. pain is 5/10 while sitting, 10/10 if attempting to walk

## 2020-04-26 ENCOUNTER — Emergency Department (HOSPITAL_COMMUNITY)
Admission: EM | Admit: 2020-04-26 | Discharge: 2020-04-26 | Disposition: A | Discharge status: Home / Self Care | Payer: Medicare Other | Attending: Emergency Medicine | Admitting: Emergency Medicine

## 2020-04-26 ENCOUNTER — Encounter (HOSPITAL_COMMUNITY): Payer: Self-pay

## 2020-04-26 DIAGNOSIS — N390 Urinary tract infection, site not specified: Secondary | ICD-10-CM | POA: Diagnosis not present

## 2020-04-26 DIAGNOSIS — Z5321 Procedure and treatment not carried out due to patient leaving prior to being seen by health care provider: Secondary | ICD-10-CM | POA: Insufficient documentation

## 2020-04-26 DIAGNOSIS — K5900 Constipation, unspecified: Secondary | ICD-10-CM | POA: Insufficient documentation

## 2020-04-26 NOTE — ED Triage Notes (Signed)
Arrived by EMS from home. [facility] for [facility] and [facility]TI. Patient has not had bowel movement X7 days.

## 2021-12-27 DIAGNOSIS — L821 Other seborrheic keratosis: Secondary | ICD-10-CM | POA: Insufficient documentation

## 2022-01-02 DIAGNOSIS — J302 Other seasonal allergic rhinitis: Secondary | ICD-10-CM | POA: Insufficient documentation

## 2022-01-02 DIAGNOSIS — R062 Wheezing: Secondary | ICD-10-CM | POA: Insufficient documentation

## 2022-01-08 DIAGNOSIS — R197 Diarrhea, unspecified: Secondary | ICD-10-CM | POA: Insufficient documentation

## 2022-01-08 DIAGNOSIS — K521 Toxic gastroenteritis and colitis: Secondary | ICD-10-CM | POA: Insufficient documentation

## 2022-02-16 DIAGNOSIS — L509 Urticaria, unspecified: Secondary | ICD-10-CM | POA: Insufficient documentation

## 2022-03-06 DIAGNOSIS — L282 Other prurigo: Secondary | ICD-10-CM | POA: Insufficient documentation

## 2022-05-01 ENCOUNTER — Other Ambulatory Visit (HOSPITAL_BASED_OUTPATIENT_CLINIC_OR_DEPARTMENT_OTHER): Payer: Self-pay | Admitting: Emergency Medicine

## 2022-05-01 DIAGNOSIS — E538 Deficiency of other specified B group vitamins: Secondary | ICD-10-CM | POA: Diagnosis present

## 2022-05-01 DIAGNOSIS — S0003XA Contusion of scalp, initial encounter: Secondary | ICD-10-CM | POA: Insufficient documentation

## 2022-05-01 DIAGNOSIS — S298XXA Other specified injuries of thorax, initial encounter: Secondary | ICD-10-CM | POA: Insufficient documentation

## 2022-05-03 ENCOUNTER — Ambulatory Visit (HOSPITAL_BASED_OUTPATIENT_CLINIC_OR_DEPARTMENT_OTHER)
Admission: RE | Admit: 2022-05-03 | Discharge: 2022-05-03 | Disposition: A | Discharge status: Home / Self Care | Payer: Medicare Other | Source: Ambulatory Visit | Attending: Emergency Medicine | Admitting: Emergency Medicine

## 2022-05-03 DIAGNOSIS — S0003XA Contusion of scalp, initial encounter: Secondary | ICD-10-CM | POA: Diagnosis present

## 2022-05-09 ENCOUNTER — Emergency Department (HOSPITAL_COMMUNITY)
Admission: EM | Admit: 2022-05-09 | Discharge: 2022-05-09 | Discharge status: Against Medical Advice | Payer: Medicare Other | Attending: Emergency Medicine | Admitting: Emergency Medicine

## 2022-05-09 ENCOUNTER — Other Ambulatory Visit (HOSPITAL_BASED_OUTPATIENT_CLINIC_OR_DEPARTMENT_OTHER): Payer: Self-pay

## 2022-05-09 ENCOUNTER — Emergency Department (HOSPITAL_BASED_OUTPATIENT_CLINIC_OR_DEPARTMENT_OTHER): Payer: Medicare Other

## 2022-05-09 ENCOUNTER — Encounter (HOSPITAL_BASED_OUTPATIENT_CLINIC_OR_DEPARTMENT_OTHER): Payer: Self-pay | Admitting: Emergency Medicine

## 2022-05-09 ENCOUNTER — Emergency Department (HOSPITAL_BASED_OUTPATIENT_CLINIC_OR_DEPARTMENT_OTHER): Payer: Medicare Other | Admitting: Radiology

## 2022-05-09 ENCOUNTER — Other Ambulatory Visit: Payer: Self-pay

## 2022-05-09 ENCOUNTER — Emergency Department (HOSPITAL_BASED_OUTPATIENT_CLINIC_OR_DEPARTMENT_OTHER)
Admission: EM | Admit: 2022-05-09 | Discharge: 2022-05-09 | Disposition: A | Discharge status: Home / Self Care | Payer: Medicare Other | Source: Home / Self Care | Attending: Emergency Medicine | Admitting: Emergency Medicine

## 2022-05-09 ENCOUNTER — Encounter (HOSPITAL_COMMUNITY): Payer: Self-pay

## 2022-05-09 DIAGNOSIS — R519 Headache, unspecified: Secondary | ICD-10-CM | POA: Diagnosis not present

## 2022-05-09 DIAGNOSIS — R197 Diarrhea, unspecified: Secondary | ICD-10-CM | POA: Insufficient documentation

## 2022-05-09 DIAGNOSIS — N3000 Acute cystitis without hematuria: Secondary | ICD-10-CM | POA: Diagnosis not present

## 2022-05-09 DIAGNOSIS — M546 Pain in thoracic spine: Secondary | ICD-10-CM | POA: Insufficient documentation

## 2022-05-09 DIAGNOSIS — Z5321 Procedure and treatment not carried out due to patient leaving prior to being seen by health care provider: Secondary | ICD-10-CM | POA: Diagnosis not present

## 2022-05-09 DIAGNOSIS — Z9181 History of falling: Secondary | ICD-10-CM | POA: Insufficient documentation

## 2022-05-09 DIAGNOSIS — E039 Hypothyroidism, unspecified: Secondary | ICD-10-CM | POA: Insufficient documentation

## 2022-05-09 DIAGNOSIS — R112 Nausea with vomiting, unspecified: Secondary | ICD-10-CM | POA: Insufficient documentation

## 2022-05-09 DIAGNOSIS — I10 Essential (primary) hypertension: Secondary | ICD-10-CM | POA: Insufficient documentation

## 2022-05-09 DIAGNOSIS — R5383 Other fatigue: Secondary | ICD-10-CM

## 2022-05-09 LAB — CBC WITH DIFFERENTIAL/PLATELET
Abs Immature Granulocytes: 0.02 10*3/uL (ref 0.00–0.07)
Abs Immature Granulocytes: 0.03 10*3/uL (ref 0.00–0.07)
Basophils Absolute: 0 10*3/uL (ref 0.0–0.1)
Basophils Absolute: 0 10*3/uL (ref 0.0–0.1)
Basophils Relative: 0 %
Basophils Relative: 0 %
Eosinophils Absolute: 0.1 10*3/uL (ref 0.0–0.5)
Eosinophils Absolute: 0 10*3/uL (ref 0.0–0.5)
Eosinophils Relative: 1 %
Eosinophils Relative: 0 %
HCT: 38.2 % (ref 36.0–46.0)
HCT: 37.3 % (ref 36.0–46.0)
Hemoglobin: 12 g/dL (ref 12.0–15.0)
Hemoglobin: 11.7 g/dL — ABNORMAL LOW (ref 12.0–15.0)
Immature Granulocytes: 0 %
Immature Granulocytes: 0 %
Lymphocytes Relative: 4 %
Lymphocytes Relative: 5 %
Lymphs Abs: 0.4 10*3/uL — ABNORMAL LOW (ref 0.7–4.0)
Lymphs Abs: 0.4 10*3/uL — ABNORMAL LOW (ref 0.7–4.0)
MCH: 28.4 pg (ref 26.0–34.0)
MCH: 28.4 pg (ref 26.0–34.0)
MCHC: 31.4 g/dL (ref 30.0–36.0)
MCHC: 31.4 g/dL (ref 30.0–36.0)
MCV: 90.5 fL (ref 80.0–100.0)
MCV: 90.5 fL (ref 80.0–100.0)
Monocytes Absolute: 0.6 10*3/uL (ref 0.1–1.0)
Monocytes Absolute: 0.7 10*3/uL (ref 0.1–1.0)
Monocytes Relative: 6 %
Monocytes Relative: 7 %
Neutro Abs: 9.8 10*3/uL — ABNORMAL HIGH (ref 1.7–7.7)
Neutro Abs: 8.3 10*3/uL — ABNORMAL HIGH (ref 1.7–7.7)
Neutrophils Relative %: 89 %
Neutrophils Relative %: 88 %
Platelets: 428 10*3/uL — ABNORMAL HIGH (ref 150–400)
Platelets: 394 10*3/uL (ref 150–400)
RBC: 4.22 MIL/uL (ref 3.87–5.11)
RBC: 4.12 MIL/uL (ref 3.87–5.11)
RDW: 16.1 % — ABNORMAL HIGH (ref 11.5–15.5)
RDW: 16 % — ABNORMAL HIGH (ref 11.5–15.5)
WBC: 10.9 10*3/uL — ABNORMAL HIGH (ref 4.0–10.5)
WBC: 9.5 10*3/uL (ref 4.0–10.5)
nRBC: 0 % (ref 0.0–0.2)
nRBC: 0 % (ref 0.0–0.2)

## 2022-05-09 LAB — COMPREHENSIVE METABOLIC PANEL
ALT: 11 U/L (ref 0–44)
ALT: 8 U/L (ref 0–44)
AST: 23 U/L (ref 15–41)
AST: 21 U/L (ref 15–41)
Albumin: 3.5 g/dL (ref 3.5–5.0)
Albumin: 4 g/dL (ref 3.5–5.0)
Alkaline Phosphatase: 68 U/L (ref 38–126)
Alkaline Phosphatase: 67 U/L (ref 38–126)
Anion gap: 11 (ref 5–15)
Anion gap: 10 (ref 5–15)
BUN: 21 mg/dL (ref 8–23)
BUN: 24 mg/dL — ABNORMAL HIGH (ref 8–23)
CO2: 22 mmol/L (ref 22–32)
CO2: 25 mmol/L (ref 22–32)
Calcium: 8.8 mg/dL — ABNORMAL LOW (ref 8.9–10.3)
Calcium: 9.2 mg/dL (ref 8.9–10.3)
Chloride: 98 mmol/L (ref 98–111)
Chloride: 95 mmol/L — ABNORMAL LOW (ref 98–111)
Creatinine, Ser: 0.61 mg/dL (ref 0.44–1.00)
Creatinine, Ser: 0.66 mg/dL (ref 0.44–1.00)
GFR, Estimated: >60 mL/min (ref >60)
GFR, Estimated: >60 mL/min (ref >60)
Glucose, Bld: 125 mg/dL — ABNORMAL HIGH (ref 70–99)
Glucose, Bld: 107 mg/dL — ABNORMAL HIGH (ref 70–99)
Potassium: 4.8 mmol/L (ref 3.5–5.1)
Potassium: 4.8 mmol/L (ref 3.5–5.1)
Sodium: 131 mmol/L — ABNORMAL LOW (ref 135–145)
Sodium: 130 mmol/L — ABNORMAL LOW (ref 135–145)
Total Bilirubin: 0.4 mg/dL (ref 0.3–1.2)
Total Bilirubin: 0.4 mg/dL (ref 0.3–1.2)
Total Protein: 7.1 g/dL (ref 6.5–8.1)
Total Protein: 7 g/dL (ref 6.5–8.1)

## 2022-05-09 LAB — URINALYSIS, ROUTINE W REFLEX MICROSCOPIC
Bilirubin Urine: NEGATIVE
Glucose, UA: NEGATIVE mg/dL
Hgb urine dipstick: NEGATIVE
Ketones, ur: NEGATIVE mg/dL
Leukocytes,Ua: NEGATIVE
Nitrite: POSITIVE — AB
Protein, ur: NEGATIVE mg/dL
Specific Gravity, Urine: 1.013 (ref 1.005–1.030)
pH: 5.5 (ref 5.0–8.0)

## 2022-05-09 LAB — LIPASE, BLOOD: Lipase: 28 U/L (ref 11–51)

## 2022-05-09 MED ORDER — ONDANSETRON HCL 4 MG PO TABS: 4.0000 mg | ORAL_TABLET | Freq: Three times a day (TID) | ORAL | 0 refills | Status: DC | PRN

## 2022-05-09 MED ORDER — FOSFOMYCIN TROMETHAMINE 3 G PO PACK
3.0000 g | PACK | Freq: Once | ORAL | Status: AC
  Administered 2022-05-09: 3 g via ORAL
  Filled 2022-05-09: qty 3

## 2022-05-09 MED ORDER — ONDANSETRON HCL 4 MG/2ML IJ SOLN
4.0000 mg | Freq: Once | INTRAMUSCULAR | Status: AC
  Administered 2022-05-09: 4 mg via INTRAVENOUS
  Filled 2022-05-09: qty 2

## 2022-05-09 MED ORDER — SODIUM CHLORIDE 0.9 % IV BOLUS
1000.0000 mL | Freq: Once | INTRAVENOUS | Status: AC
  Administered 2022-05-09: 1000 mL via INTRAVENOUS

## 2022-05-09 NOTE — ED Notes (Signed)
Patient has had no emesis since start of shift. Patient is resting at this time, reports she had been awake for most of the night. Patient's husband went home and will return in a bit.

## 2022-05-09 NOTE — ED Triage Notes (Signed)
Pt reports with nausea and vomiting since this morning. Pts last food intake was Tuesday morning. Pt reports recently having a UTI and also states that she fell 2 weeks ago.

## 2022-05-09 NOTE — ED Triage Notes (Signed)
Pt c/o N/V/D x 2 days.

## 2022-05-09 NOTE — ED Provider Notes (Signed)
[facility] EMERGENCY DEPT Provider Note   CSN: [id number] Arrival date & time: 05/09/22  4332     History  Chief Complaint  Patient presents with   Emesis    Carolyn Henderson is a 88 y.o. female.  The history is provided by the patient, the spouse and medical records. No language interpreter was used.  Emesis Severity:  Moderate Duration:  3 days Timing:  Constant Quality:  Stomach contents Progression:  Unchanged Chronicity:  New Recent urination:  Decreased Relieved by:  Nothing Worsened by:  Nothing Ineffective treatments:  None tried Associated symptoms: diarrhea and headaches (intermittnet)   Associated symptoms: no abdominal pain, no chills, no cough, no fever and no URI        Home Medications Prior to Admission medications   Medication Sig Start Date End Date Taking? Authorizing Provider  acetaminophen (TYLENOL) 325 MG tablet Take 2 tablets (650 mg total) by mouth every 6 (six) hours as needed for mild pain (or Fever >/= 101). 08/05/14   Danae Orleans, PA-C  amLODipine (NORVASC) 2.5 MG tablet Take 1 tablet (2.5 mg total) by mouth daily. 10/21/15   Burchette, Alinda Sierras, MD  benzonatate (TESSALON) 100 MG capsule Take 1 capsule (100 mg total) by mouth 3 (three) times daily as needed for cough. 01/17/16   Burchette, Alinda Sierras, MD  Calcium Carbonate-Vit D-Min (CALCIUM 1200 PO) Take by mouth.    [provider]  Carboxymethylcell-Hypromellose (GENTEAL OP) Apply 1 drop to eye at bedtime.    [provider]  cetirizine (ZYRTEC) 10 MG tablet Take 10 mg by mouth at bedtime.     [provider]  cholecalciferol (VITAMIN D) 1000 UNITS tablet Take 1,000 Units by mouth daily.    [provider]  clobetasol ointment (TEMOVATE) 9.51 % Apply 1 application topically 2 (two) times daily as needed (for eczema).    [provider]  clotrimazole-betamethasone (LOTRISONE) cream Apply 1 application topically 2 (two) times  daily. Patient taking differently: Apply 1 application topically 2 (two) times daily as needed (itching, eczema).  10/27/15   Burchette, Alinda Sierras, MD  diphenhydrAMINE (BENADRYL) 25 MG tablet Take 25-50 mg by mouth every 6 (six) hours as needed for itching or allergies.     [provider]  doxycycline (VIBRA-TABS) 100 MG tablet Take 1 tablet (100 mg total) by mouth 2 (two) times daily. Patient not taking: Reported on 04/20/2016 01/17/16   Eulas Post, MD  ferrous sulfate 324 (65 Fe) MG TBEC Take 324 mg by mouth daily. 04/16/16   [provider]  gabapentin (NEURONTIN) 300 MG capsule TAKE 1 TO 3 CAPSULES BY MOUTH EVERY DAY AS DIRECTED Patient taking differently: Take 300 mg by mouth 3 (three) times daily.  08/18/15   Dorena Cookey, MD  ibuprofen (ADVIL) 400 MG tablet Take 1 tablet (400 mg total) by mouth every 6 (six) hours as needed for up to 20 doses. 03/12/20   Fawze, Mina A, PA-C  Multiple Vitamins-Minerals (PRESERVISION AREDS 2) CAPS Take 1 capsule by mouth 2 (two) times daily.     [provider]  Omega-3 Fatty Acids (FISH OIL PO) Take 1 capsule by mouth 2 (two) times daily.    [provider]  ondansetron (ZOFRAN ODT) 4 MG disintegrating tablet '4mg'$  ODT q4 hours prn nausea/vomit 04/20/16   Deno Etienne, DO  ondansetron (ZOFRAN ODT) 4 MG disintegrating tablet '4mg'$  ODT q4 hours prn nausea/vomit 04/20/16   Deno Etienne, DO  oxyCODONE (ROXICODONE) 5 MG  immediate release tablet Take 0.5 tablets (2.5 mg total) by mouth every 4 (four) hours as needed for severe pain. 04/20/16   Deno Etienne, DO  oxyCODONE (ROXICODONE) 5 MG immediate release tablet Take 0.5 tablets (2.5 mg total) by mouth every 4 (four) hours as needed for severe pain. 04/20/16   Deno Etienne, DO  Polyethyl Glycol-Propyl Glycol (SYSTANE OP) Apply 1 drop to eye 3 (three) times daily as needed (for dry eyes).     [provider]  Probiotic Product (ACIDOPHILUS/GOAT MILK) CAPS Take 1 capsule by mouth every  evening.    [provider]  rOPINIRole (REQUIP) 0.25 MG tablet Take 1 tablet by mouth at  bedtime Patient taking differently: Take 0.'5mg'$  by mouth at  bedtime for restless legs 03/19/16   Eulas Post, MD  SYNTHROID 125 MCG tablet TAKE 1 TABLET BY MOUTH EVERY MORNING BEFORE BREAKFAST 10/01/16   Burchette, Alinda Sierras, MD  tacrolimus (PROTOPIC) 0.1 % ointment Apply 0.1 % topically daily as needed (for itchy eyelids).     [provider]  vitamin B-12 (CYANOCOBALAMIN) 1000 MCG tablet Take 1,000 mcg by mouth daily.    [provider]      Allergies    Cephalosporins, Codeine sulfate, Hydrocodone, Percocet [oxycodone-acetaminophen], Tramadol, and Penicillins    Review of Systems   Review of Systems  Constitutional:  Negative for chills, diaphoresis, fatigue and fever.  HENT:  Negative for congestion.   Eyes:  Negative for visual disturbance.  Respiratory:  Negative for cough, chest tightness, shortness of breath and wheezing.   Cardiovascular:  Negative for chest pain and palpitations.  Gastrointestinal:  Positive for diarrhea, nausea and vomiting. Negative for abdominal distention, abdominal pain and constipation.  Genitourinary:  Positive for decreased urine volume. Negative for dysuria and flank pain.  Musculoskeletal:  Positive for back pain. Negative for neck pain and neck stiffness.  Skin:  Negative for rash and wound.  Neurological:  Positive for headaches (intermittnet). Negative for dizziness, seizures, facial asymmetry, speech difficulty, weakness, light-headedness and numbness.  Psychiatric/Behavioral:  Negative for agitation and confusion.   All other systems reviewed and are negative.   Physical Exam Updated Vital Signs BP 134/62 (BP Location: Right Arm)   Pulse 80   Temp 97.8 F (36.6 C) (Oral)   Resp 18   Ht '5\' 3"'$  (1.6 m)   Wt 62.1 kg   SpO2 94%   BMI 24.25 kg/m  Physical Exam Vitals and nursing note reviewed.  Constitutional:       General: She is not in acute distress.    Appearance: She is well-developed. She is not ill-appearing, toxic-appearing or diaphoretic.  HENT:     Head: Normocephalic and atraumatic.     Nose: No congestion or rhinorrhea.     Mouth/Throat:     Mouth: Mucous membranes are dry.     Pharynx: No oropharyngeal exudate or posterior oropharyngeal erythema.  Eyes:     Extraocular Movements: Extraocular movements intact.     Conjunctiva/sclera: Conjunctivae normal.     Pupils: Pupils are equal, round, and reactive to light.  Cardiovascular:     Rate and Rhythm: Normal rate and regular rhythm.     Heart sounds: No murmur heard. Pulmonary:     Effort: Pulmonary effort is normal. No respiratory distress.     Breath sounds: Normal breath sounds. No wheezing, rhonchi or rales.  Chest:     Chest wall: No tenderness.  Abdominal:     General: Abdomen is flat.  Palpations: Abdomen is soft.     Tenderness: There is no abdominal tenderness. There is no right CVA tenderness, left CVA tenderness, guarding or rebound.  Musculoskeletal:        General: Tenderness present. No swelling.     Cervical back: Neck supple. No tenderness.     Thoracic back: Tenderness present. No lacerations.     Lumbar back: No tenderness.       Back:     Right lower leg: No edema.     Left lower leg: No edema.  Skin:    General: Skin is warm and dry.     Capillary Refill: Capillary refill takes less than 2 seconds.     Findings: No erythema or rash.  Neurological:     General: No focal deficit present.     Mental Status: She is alert.     Cranial Nerves: No cranial nerve deficit.     Sensory: No sensory deficit.     Motor: No weakness.     Coordination: Coordination normal.  Psychiatric:        Mood and Affect: Mood normal.     ED Results / Procedures / Treatments   Labs (all labs ordered are listed, but only abnormal results are displayed) Labs Reviewed  URINALYSIS, ROUTINE W REFLEX MICROSCOPIC - Abnormal;  Notable for the following components:      Result Value   Nitrite POSITIVE (*)    Bacteria, UA MANY (*)    All other components within normal limits  CBC WITH DIFFERENTIAL/PLATELET - Abnormal; Notable for the following components:   Hemoglobin 11.7 (*)    RDW 16.0 (*)    Neutro Abs 8.3 (*)    Lymphs Abs 0.4 (*)    All other components within normal limits  COMPREHENSIVE METABOLIC PANEL - Abnormal; Notable for the following components:   Sodium 130 (*)    Chloride 95 (*)    Glucose, Bld 107 (*)    BUN 24 (*)    All other components within normal limits  URINE CULTURE  LIPASE, BLOOD    EKG None  Radiology CT Head Wo Contrast  Result Date: 05/09/2022 CLINICAL DATA:  Head trauma. Fall 2 weeks ago. Intermittent headache with new nausea and vomiting. EXAM: CT HEAD WITHOUT CONTRAST TECHNIQUE: Contiguous axial images were obtained from the base of the skull through the vertex without intravenous contrast. RADIATION DOSE REDUCTION: This exam was performed according to the departmental dose-optimization program which includes automated exposure control, adjustment of the mA and/or kV according to patient size and/or use of iterative reconstruction technique. COMPARISON:  Head CT 05/03/2022 and MRI 05/06/2010 FINDINGS: Brain: There is no evidence of an acute infarct, intracranial hemorrhage, mass, midline shift, or extra-axial fluid collection. Hypodensities in the cerebral white matter bilaterally are unchanged from the recent prior CT and are nonspecific but compatible with chronic small vessel ischemic disease which is mild for age. A cyst in the right occipital lobe has not enlarged from 2011 and is considered benign. Mild cerebral atrophy is likely within normal limits for age. Vascular: Calcified atherosclerosis at the skull base. No hyperdense vessel. Skull: No fracture or suspicious osseous lesion. Sinuses/Orbits: Bilateral cataract extraction. Mild mucosal thickening or small mucous  retention cyst in the right sphenoid sinus. No significant mastoid fluid. Other: None. IMPRESSION: 1. No evidence of acute intracranial abnormality. 2. Mild chronic small vessel ischemic disease. Electronically Signed   By: Logan Bores M.D.   On: 05/09/2022 08:03   DG Chest  2 View  Result Date: 05/09/2022 CLINICAL DATA:  Recent fall with back pain EXAM: CHEST - 2 VIEW COMPARISON:  None Available. FINDINGS: Normal cardiac silhouette. Low lung volumes. No pneumothorax. No rib fracture identified. Multiple levels of vertebroplasty augmentation lower thoracic spine. Compression deformity at T7 appears chronic. Probable hiatal hernia. IMPRESSION: 1. No radiographic evidence of acute thoracic trauma. 2. Multiple levels thoracic spine augmentation. 3. Compression deformity T7 appears chronic. 4. Hiatal hernia. Electronically Signed   By: Suzy Bouchard M.D.   On: 05/09/2022 08:03   DG Thoracic Spine 2 View  Result Date: 05/09/2022 CLINICAL DATA:  Recent fall with back pain EXAM: THORACIC SPINE 2 VIEWS COMPARISON:  None Available. FINDINGS: Multiple levels of vertebroplasty augmentation in the lower thoracic spine (T8 through T12). A compression deformity of the T7 vertebral body noted. No clear evidence of acute fracture radiography. Scoliosis of the spine. IMPRESSION: 1. No clear evidence of acute thoracic spine fracture by plain film radiography. 2. Multiple levels of compression deformity with augmentation. Electronically Signed   By: Suzy Bouchard M.D.   On: 05/09/2022 08:01    Procedures Procedures    Medications Ordered in ED Medications  fosfomycin (MONUROL) packet 3 g (has no administration in time range)  sodium chloride 0.9 % bolus 1,000 mL (1,000 mLs Intravenous New Bag/Given 05/09/22 0759)  ondansetron (ZOFRAN) injection 4 mg (4 mg Intravenous Given 05/09/22 0800)    ED Course/ Medical Decision Making/ A&P                           Medical Decision Making Amount and/or Complexity of  Data Reviewed Labs: ordered. Radiology: ordered.  Risk Prescription drug management.    DORREEN VALIENTE is a 88 y.o. female with a past medical history significant for hypertension, hyperlipidemia, hypothyroidism, restless leg syndrome, diverticulosis, and GERD with recent UTI status post antibiotics and recent fall who presents with intermittent headaches, mid back pain, and 3 days of nausea, vomiting, diarrhea, decreased urine.  According to patient, several weeks ago she had a UTI with dysuria that improved.  She reports that she took antibiotics.  She says that about a week and a half ago she had a fall hitting her head and back on the ground.  She reports she has had some intermittent headaches since then and had a CT scan a week ago that did not show acute fracture or bleeding but over the last few days she has developed nausea, vomiting, and occasional loose stools.  She reports still having some paraspinal mid back pain but is denying any chest pain or shortness of breath.  Denies any abdominal pain.  Denies any extremity pains or symptoms.  Denies numbness, tingling, weakness of extremities.  Denies dizziness.  Denies speech or vision changes.  Patient denies any sick contacts with other nausea vomiting or diarrhea but does feel she is getting dehydrated as she has not had any drink since yesterday.  On exam, lungs clear and chest nontender.  Abdomen was nontender I did hear bowel sounds.  She reports she is still passing gas.  Patient had no low back tenderness or neck tenderness but had some paraspinal mid back tenderness.  There was not midline tenderness.  There is no bruising or laceration seen on the back.  Patient had no focal neurologic deficits with intact sensation and strength in extremities.  Normal finger-nose-finger testing.  Symmetric smile.  Clear speech.  Pupils are symmetric and reactive  phone extract movements.  Clinically given her nausea, vomiting, diarrhea, and decreased  intake I am concerned she may be slightly dehydrated with what may have been a recent GI bug.  With her darkened and decreased urine and her recent UTI, we will recheck a urinalysis to make sure she does not still have smoldering UTIs.  With her recent head injury and back injury with this worsened nausea and vomiting and intermittent headache still, we will get a repeat CT head to look for delayed bleed although she is not on blood thinners.  We will also get chest x-ray to rule out occult back fracture or rib fractures that were missed on initial imaging she reports.  We will give her some fluids and some nausea medicine and check some basic labs.  If work-up is reassuring, suspect a combination of dehydration from nausea, vomiting, diarrhea that could be gastroenteritis in etiology coupled with some postconcussive headaches leading to her feeling ill.  If work-up is reassuring, anticipate discharge home with new nausea medicine and PCP follow-up  10:38 AM Work-up has returned showing no evidence of acute trauma worsened from previous fall.  Patient's labs showed some similar hyponatremia to prior like related to the diarrhea and vomiting she has had for the last few days.  Patient's recent UTI may still be smoldering given the nitrites and bacteria with her continued urinary changes.  Patient will be given a dose of fosfomycin as she reports she has intolerances to numerous antibiotics typically use for UTIs.  Do not feel this is pyelonephritis.  Patient will be given scription for nausea medicine and encouraged to maintain hydration at home.  I suspect there is degree of likely gastroenteritis as well.  As she is feeling much better on reassessment she will be discharged home after the dose of fosfomycin with a prescription for nausea medicine and instructions to rest and follow-up with PCP.         Final Clinical Impression(s) / ED Diagnoses Final diagnoses:  Acute cystitis without  hematuria  Nausea vomiting and diarrhea  Fatigue, unspecified type  History of recent fall    Rx / DC Orders ED Discharge Orders          Ordered    ondansetron (ZOFRAN) 4 MG tablet  Every 8 hours PRN        05/09/22 1040            Clinical Impression: 1. Acute cystitis without hematuria   2. Nausea vomiting and diarrhea   3. Fatigue, unspecified type   4. History of recent fall     Disposition: Discharge  Condition: Good  I have discussed the results, Dx and Tx plan with the pt(& family if present). He/she/they expressed understanding and agree(s) with the plan. Discharge instructions discussed at great length. Strict return precautions discussed and pt &/or family have verbalized understanding of the instructions. No further questions at time of discharge.    New Prescriptions   ONDANSETRON (ZOFRAN) 4 MG TABLET    Take 1 tablet (4 mg total) by mouth every 8 (eight) hours as needed for nausea or vomiting.    Follow Up: [place] [address] [phone] Schedule an appointment as soon as possible for a visit    [facility] [address] [place] [address] [phone]        Zakia Sainato, Gwenyth Allegra, MD 05/09/22 [id number]

## 2022-05-09 NOTE — ED Notes (Signed)
Pt left the facility 

## 2022-05-09 NOTE — Discharge Instructions (Signed)
Your history, exam, work-up today are consistent with likely continued musculoskeletal pains from her fall as well as some nausea vomiting and diarrhea that may have been related to a viral gastroenteritis versus residual symptoms from a smoldering UTI that we discovered.  Please take the nausea medicine to help maintain hydration and you were treated with the one-time dose of antibiotics here in the emergency department with fosfomycin.  Please follow-up with your primary doctor and rest and stay hydrated.  If any symptoms change or worsen acutely, please return to the nearest emergency department.

## 2022-05-09 NOTE — ED Notes (Signed)
Patient assisted to bathroom by EMT, RN had placed a hat in the toilet for patient. Patient sat in bathroom and unable to provide urine sample. Patient reports she will attempt again after fluids

## 2022-05-09 NOTE — ED Notes (Signed)
Patient in imaging will obtain blood work and start IV fluids when patient returns

## 2022-05-09 NOTE — ED Notes (Signed)
Patient in bathroom attempting to get a urine sample

## 2022-05-11 LAB — URINE CULTURE: Culture: >=100000 — AB

## 2022-05-12 ENCOUNTER — Telehealth (HOSPITAL_BASED_OUTPATIENT_CLINIC_OR_DEPARTMENT_OTHER): Payer: Self-pay | Admitting: *Deleted

## 2022-05-12 NOTE — Progress Notes (Signed)
ED Antimicrobial Stewardship Positive Culture Follow Up   Carolyn Henderson is an 88 y.o. female who presented to [facility] with a chief complaint of  Chief Complaint  Patient presents with   Emesis    Recent Results (from the past 720 hour(s))  Urine Culture     Status: Abnormal   Collection Time: 05/09/22  7:39 AM   Specimen: Urine, Clean Catch  Result Value Ref Range Status   Specimen Description   Final    URINE, CLEAN CATCH Performed at [facility], [address] [place], [place] [address]    Special Requests   Final    NONE Performed at [facility], [address] [place], [place] [address]    Culture >=100,000 COLONIES/mL KLEBSIELLA PNEUMONIAE (A)  Final   Report Status 05/11/2022 FINAL  Final   Organism ID, Bacteria KLEBSIELLA PNEUMONIAE (A)  Final      Susceptibility   Klebsiella pneumoniae - MIC*    AMPICILLIN >=32 RESISTANT Resistant     CEFAZOLIN <=4 SENSITIVE Sensitive     CEFEPIME <=0.12 SENSITIVE Sensitive     CEFTRIAXONE <=0.25 SENSITIVE Sensitive     CIPROFLOXACIN <=0.25 SENSITIVE Sensitive     GENTAMICIN <=1 SENSITIVE Sensitive     IMIPENEM <=0.25 SENSITIVE Sensitive     NITROFURANTOIN 64 INTERMEDIATE Intermediate     TRIMETH/SULFA <=20 SENSITIVE Sensitive     AMPICILLIN/SULBACTAM 4 SENSITIVE Sensitive     PIP/TAZO <=4 SENSITIVE Sensitive     * >=100,000 COLONIES/mL KLEBSIELLA PNEUMONIAE    '[x]'$  Treated with fosfomycin in the ED. Fosfomycin should have empiric coverage of Kleb pneumo, but is not checked on sensitivity panel.   MD Tegeler asks that we call to ensure patient is improving / not worsening. If no improvement, please call me at [phone], so that I can come up with treatment plan with MD.  ED Provider: Tegeler MD   Lorelei Pont, PharmD, BCPS 05/12/2022 10:41 AM ED Clinical Pharmacist -  [phone]

## 2022-05-12 NOTE — Telephone Encounter (Signed)
Post ED Visit - Positive Culture Follow-up  Culture report reviewed by antimicrobial stewardship pharmacist: [facility] Team '[]'$  Elenor Quinones, Pharm.D. '[]'$  Heide Guile, Pharm.D., BCPS AQ-ID '[]'$  Parks Neptune, Pharm.D., BCPS '[]'$  Alycia Rossetti, Pharm.D., BCPS '[]'$  Loving, Pharm.D., BCPS, AAHIVP '[]'$  Legrand Como, Pharm.D., BCPS, AAHIVP '[]'$  Salome Arnt, PharmD, BCPS '[]'$  Johnnette Gourd, PharmD, BCPS '[]'$  Hughes Better, PharmD, BCPS '[]'$  Leeroy Cha, PharmD '[]'$  Laqueta Linden, PharmD, BCPS '[x]'$  Franchot Gallo, PharmD  [facility] Team '[]'$  Leodis Sias, PharmD '[]'$  Lindell Spar, PharmD '[]'$  Royetta Asal, PharmD '[]'$  Graylin Shiver, Rph '[]'$  Rema Fendt) Glennon Mac, PharmD '[]'$  Arlyn Dunning, PharmD '[]'$  Netta Cedars, PharmD '[]'$  Dia Sitter, PharmD '[]'$  Leone Haven, PharmD '[]'$  Gretta Arab, PharmD '[]'$  Theodis Shove, PharmD '[]'$  Peggyann Juba, PharmD '[]'$  Reuel Boom, PharmD   Positive urine culture Treated with Fosfomycin,   Spoke with patient and she stated she is feeling better, and no further patient follow-up is required at this time.  Rosie Fate 05/12/2022, 11:26 AM

## 2022-05-21 ENCOUNTER — Encounter (HOSPITAL_BASED_OUTPATIENT_CLINIC_OR_DEPARTMENT_OTHER): Payer: Self-pay

## 2022-05-21 ENCOUNTER — Other Ambulatory Visit: Payer: Self-pay

## 2022-05-21 ENCOUNTER — Emergency Department (HOSPITAL_BASED_OUTPATIENT_CLINIC_OR_DEPARTMENT_OTHER)
Admission: EM | Admit: 2022-05-21 | Discharge: 2022-05-21 | Disposition: A | Discharge status: Home / Self Care | Payer: Medicare Other | Attending: Emergency Medicine | Admitting: Emergency Medicine

## 2022-05-21 ENCOUNTER — Emergency Department (HOSPITAL_BASED_OUTPATIENT_CLINIC_OR_DEPARTMENT_OTHER): Payer: Medicare Other | Admitting: Radiology

## 2022-05-21 DIAGNOSIS — M25471 Effusion, right ankle: Secondary | ICD-10-CM | POA: Diagnosis present

## 2022-05-21 DIAGNOSIS — Z79899 Other long term (current) drug therapy: Secondary | ICD-10-CM | POA: Diagnosis not present

## 2022-05-21 DIAGNOSIS — L03115 Cellulitis of right lower limb: Secondary | ICD-10-CM | POA: Diagnosis not present

## 2022-05-21 MED ORDER — CLINDAMYCIN HCL 150 MG PO CAPS: 450.0000 mg | ORAL_CAPSULE | Freq: Three times a day (TID) | ORAL | 0 refills | Status: AC

## 2022-05-21 MED ORDER — CLINDAMYCIN HCL 150 MG PO CAPS
450.0000 mg | ORAL_CAPSULE | Freq: Once | ORAL | Status: AC
  Administered 2022-05-21: 450 mg via ORAL
  Filled 2022-05-21: qty 3

## 2022-05-21 NOTE — Discharge Instructions (Addendum)
Your presentation today is consistent with cellulitis or skin infection.  You are being prescribed an antibiotic that can treat multiple different causes of cellulitis.  If the redness significantly worsens or is streaking up your leg, if you develop fever, chills, body aches, new or worsening pain in the leg, or any other new/concerning symptoms then return to the ER for evaluation.  Otherwise you should follow-up closely with your primary care physician in the next 2-3 days for a wound recheck.

## 2022-05-21 NOTE — ED Provider Notes (Signed)
[facility]ER[facility]ENC[facility] DEPT Provider Note   CSN: [id number] Arrival date & time: 05/21/22  1049     History  Chief Complaint  Patient presents with   Joint Swelling    Carolyn Henderson is a 88 y.o. female.  HPI 88 year old female presents with right ankle swelling and redness.  About 1 week ago she noticed a small wound to the medial aspect of her ankle, does not remember any trauma.  She put a Band-Aid and some topical antibiotic ointment on it.  However now she has noticed worsening redness and swelling.  It is sore to the touch but she is able to ambulate on it without difficulty.  No fevers or systemic symptoms.  Home Medications Prior to Admission medications   Medication Sig Start Date End Date Taking? Authorizing Provider  clindamycin (CLEOCIN) 150 MG capsule Take 3 capsules (450 mg total) by mouth 3 (three) times daily for 5 days. 05/21/22 05/26/22 Yes Sherwood Gambler, MD  acetaminophen (TYLENOL) 325 MG tablet Take 2 tablets (650 mg total) by mouth every 6 (six) hours as needed for mild pain (or Fever >/= 101). 08/05/14   Danae Orleans, PA-C  amLODipine (NORVASC) 2.5 MG tablet Take 1 tablet (2.5 mg total) by mouth daily. 10/21/15   Burchette, Alinda Sierras, MD  benzonatate (TESSALON) 100 MG capsule Take 1 capsule (100 mg total) by mouth 3 (three) times daily as needed for cough. 01/17/16   Burchette, Alinda Sierras, MD  Calcium Carbonate-Vit D-Min (CALCIUM 1200 PO) Take by mouth.    [provider]  Carboxymethylcell-Hypromellose (GENTEAL OP) Apply 1 drop to eye at bedtime.    [provider]  cetirizine (ZYRTEC) 10 MG tablet Take 10 mg by mouth at bedtime.     [provider]  cholecalciferol (VITAMIN D) 1000 UNITS tablet Take 1,000 Units by mouth daily.    [provider]  clobetasol ointment (TEMOVATE) 4.62 % Apply 1 application topically 2 (two) times daily as needed (for eczema).    [provider]  clotrimazole-betamethasone  (LOTRISONE) cream Apply 1 application topically 2 (two) times daily. Patient taking differently: Apply 1 application topically 2 (two) times daily as needed (itching, eczema).  10/27/15   Burchette, Alinda Sierras, MD  diphenhydrAMINE (BENADRYL) 25 MG tablet Take 25-50 mg by mouth every 6 (six) hours as needed for itching or allergies.     [provider]  doxycycline (VIBRA-TABS) 100 MG tablet Take 1 tablet (100 mg total) by mouth 2 (two) times daily. Patient not taking: Reported on 04/20/2016 01/17/16   Eulas Post, MD  ferrous sulfate 324 (65 Fe) MG TBEC Take 324 mg by mouth daily. 04/16/16   [provider]  gabapentin (NEURONTIN) 300 MG capsule TAKE 1 TO 3 CAPSULES BY MOUTH EVERY DAY AS DIRECTED Patient taking differently: Take 300 mg by mouth 3 (three) times daily.  08/18/15   Dorena Cookey, MD  ibuprofen (ADVIL) 400 MG tablet Take 1 tablet (400 mg total) by mouth every 6 (six) hours as needed for up to 20 doses. 03/12/20   Fawze, Mina A, PA-C  Multiple Vitamins-Minerals (PRESERVISION AREDS 2) CAPS Take 1 capsule by mouth 2 (two) times daily.     [provider]  Omega-3 Fatty Acids (FISH OIL PO) Take 1 capsule by mouth 2 (two) times daily.    [provider]  ondansetron (ZOFRAN ODT) 4 MG disintegrating tablet '4mg'$  ODT q4 hours prn nausea/vomit 04/20/16   Deno Etienne, DO  ondansetron (ZOFRAN ODT)  4 MG disintegrating tablet '4mg'$  ODT q4 hours prn nausea/vomit 04/20/16   Deno Etienne, DO  ondansetron (ZOFRAN) 4 MG tablet Take 1 tablet (4 mg total) by mouth every 8 (eight) hours as needed for nausea or vomiting. 05/09/22   Tegeler, Gwenyth Allegra, MD  oxyCODONE (ROXICODONE) 5 MG immediate release tablet Take 0.5 tablets (2.5 mg total) by mouth every 4 (four) hours as needed for severe pain. 04/20/16   Deno Etienne, DO  oxyCODONE (ROXICODONE) 5 MG immediate release tablet Take 0.5 tablets (2.5 mg total) by mouth every 4 (four) hours as needed for severe pain. 04/20/16   Deno Etienne, DO   Polyethyl Glycol-Propyl Glycol (SYSTANE OP) Apply 1 drop to eye 3 (three) times daily as needed (for dry eyes).     [provider]  Probiotic Product (ACIDOPHILUS/GOAT MILK) CAPS Take 1 capsule by mouth every evening.    [provider]  rOPINIRole (REQUIP) 0.25 MG tablet Take 1 tablet by mouth at  bedtime Patient taking differently: Take 0.'5mg'$  by mouth at  bedtime for restless legs 03/19/16   Eulas Post, MD  SYNTHROID 125 MCG tablet TAKE 1 TABLET BY MOUTH EVERY MORNING BEFORE BREAKFAST 10/01/16   Burchette, Alinda Sierras, MD  tacrolimus (PROTOPIC) 0.1 % ointment Apply 0.1 % topically daily as needed (for itchy eyelids).     [provider]  vitamin B-12 (CYANOCOBALAMIN) 1000 MCG tablet Take 1,000 mcg by mouth daily.    [provider]      Allergies    Cephalosporins, Codeine sulfate, Hydrocodone, Percocet [oxycodone-acetaminophen], Tramadol, and Penicillins    Review of Systems   Review of Systems  Constitutional:  Negative for fever.  Musculoskeletal:  Positive for arthralgias and joint swelling.  Skin:  Positive for color change.    Physical Exam Updated Vital Signs BP (!) 179/85 (BP Location: Right Arm)   Pulse 72   Temp 97.8 F (36.6 C) (Oral)   Resp 16   Ht '5\' 3"'$  (1.6 m)   Wt 62.1 kg   SpO2 100%   BMI 24.25 kg/m  Physical Exam Vitals and nursing note reviewed.  Constitutional:      Appearance: She is well-developed.  HENT:     Head: Normocephalic and atraumatic.  Cardiovascular:     Rate and Rhythm: Normal rate and regular rhythm.     Pulses:          Dorsalis pedis pulses are 2+ on the right side.  Pulmonary:     Effort: Pulmonary effort is normal.     Breath sounds: Normal breath sounds.  Abdominal:     General: There is no distension.  Musculoskeletal:     Right ankle: Swelling present. No deformity. Tenderness present. Normal range of motion.     Right Achilles Tendon: No tenderness or defects.     Comments: See  picture.  Patient has a small superficial wound proximal to the medial malleolus.  Mostly medial swelling and mild warmth with erythema.  No streaking.  There is some mild tenderness inferior to the right lateral malleolus but it is mild and there is no obvious cellulitis.  Is able to range her ankle without difficulty.   Skin:    General: Skin is warm and dry.     Findings: Erythema present.  Neurological:     Mental Status: She is alert.         ED Results / Procedures / Treatments   Labs (all labs ordered are listed, but only abnormal  results are displayed) Labs Reviewed - No data to display  EKG None  Radiology DG Ankle Complete Right  Result Date: 05/21/2022 CLINICAL DATA:  Right Ankle Swelling EXAM: RIGHT ANKLE - COMPLETE 3+ VIEW COMPARISON:  None Available. FINDINGS: There is no evidence of fracture or dislocation. Mild degenerative changes at the intertarsal joints. Moderately large soft tissue swelling. There is an apparent lucency at the lateral aspect of the ankle proximal foot inferior to the tip of the malleolus. Vascular calcifications. IMPRESSION: No fracture or dislocation. Mild degenerative changes. Soft tissue swelling. Apparent soft tissue emphysema at the lateral aspect of the ankle inferior to the tip of the malleolus and is suspicious for infection. Clinical correlation is suggested. Electronically Signed   By: Frazier Richards M.D.   On: 05/21/2022 11:39    Procedures Ultrasound ED Soft Tissue  Date/Time: 05/21/2022 12:17 PM  Performed by: Sherwood Gambler, MD Authorized by: Sherwood Gambler, MD   Procedure details:    Indications: limb pain, localization of abscess and evaluate for cellulitis     Transverse view:  Visualized   Longitudinal view:  Visualized   Images: archived   Location:    Location: lower extremity     Side:  Right Findings:     no abscess present    cellulitis present Comments:     Cobblestoning consistent with cellulitis on the medial  aspect.  No obvious fluid collection/deeper infection     Medications Ordered in ED Medications  clindamycin (CLEOCIN) capsule 450 mg (has no administration in time range)    ED Course/ Medical Decision Making/ A&P                           Medical Decision Making Amount and/or Complexity of Data Reviewed Radiology: ordered and independent interpretation performed.    Details: No fracture/dislocation to the ankle.  Nonspecific darkened area but no gas  Risk Prescription drug management.   Presentation today is consistent with cellulitis.  She has no systemic symptoms.  Strong DP pulse.  She has full range of motion of the ankle and my suspicion of septic joint is pretty low.  I also doubt a deep space infection or life-threatening infection such as necrotizing fasciitis.  Put a bedside ultrasound on as above and there is no obvious fluid collection.  Her symptoms are primarily medial which makes the lateral finding on x-ray a little strange.  Either way, no obvious abscess and so at this point we will put on antibiotics.  Given her intolerances/allergies to multiple meds, I unfortunately think that clindamycin would be the best to cover both strep and staph.  We discussed return precautions but at this time she appears stable for discharge to follow-up with PCP.        Final Clinical Impression(s) / ED Diagnoses Final diagnoses:  Cellulitis of right ankle    Rx / DC Orders ED Discharge Orders          Ordered    clindamycin (CLEOCIN) 150 MG capsule  3 times daily        05/21/22 1214              Sherwood Gambler, MD 05/21/22 1219

## 2022-05-21 NOTE — ED Triage Notes (Signed)
Patient here POV from Home.  Endorses Swelling to Right Ankle that began approximately 6 days ago. Noted Area of Broken Skin to Right Medial Ankle and Patient believes it is resultant from an Infection versus Trauma.  No Known Fevers.  NAD Noted during Triage. A&Ox4. GCS 15. Ambulatory with Personal Gilford Rile.

## 2022-05-25 ENCOUNTER — Encounter (HOSPITAL_BASED_OUTPATIENT_CLINIC_OR_DEPARTMENT_OTHER): Payer: Self-pay

## 2022-05-25 ENCOUNTER — Emergency Department (HOSPITAL_BASED_OUTPATIENT_CLINIC_OR_DEPARTMENT_OTHER)
Admission: EM | Admit: 2022-05-25 | Discharge: 2022-05-25 | Disposition: A | Discharge status: Home / Self Care | Payer: Medicare Other | Attending: Emergency Medicine | Admitting: Emergency Medicine

## 2022-05-25 DIAGNOSIS — L03115 Cellulitis of right lower limb: Secondary | ICD-10-CM | POA: Diagnosis present

## 2022-05-25 LAB — CBC WITH DIFFERENTIAL/PLATELET
Abs Immature Granulocytes: 0.02 10*3/uL (ref 0.00–0.07)
Basophils Absolute: 0.1 10*3/uL (ref 0.0–0.1)
Basophils Relative: 1 %
Eosinophils Absolute: 0.3 10*3/uL (ref 0.0–0.5)
Eosinophils Relative: 4 %
HCT: 38.3 % (ref 36.0–46.0)
Hemoglobin: 12.1 g/dL (ref 12.0–15.0)
Immature Granulocytes: 0 %
Lymphocytes Relative: 16 %
Lymphs Abs: 1.1 10*3/uL (ref 0.7–4.0)
MCH: 28.5 pg (ref 26.0–34.0)
MCHC: 31.6 g/dL (ref 30.0–36.0)
MCV: 90.1 fL (ref 80.0–100.0)
Monocytes Absolute: 0.6 10*3/uL (ref 0.1–1.0)
Monocytes Relative: 8 %
Neutro Abs: 5.2 10*3/uL (ref 1.7–7.7)
Neutrophils Relative %: 71 %
Platelets: 380 10*3/uL (ref 150–400)
RBC: 4.25 MIL/uL (ref 3.87–5.11)
RDW: 16.3 % — ABNORMAL HIGH (ref 11.5–15.5)
WBC: 7.2 10*3/uL (ref 4.0–10.5)
nRBC: 0 % (ref 0.0–0.2)

## 2022-05-25 LAB — COMPREHENSIVE METABOLIC PANEL
ALT: 10 U/L (ref 0–44)
AST: 22 U/L (ref 15–41)
Albumin: 4 g/dL (ref 3.5–5.0)
Alkaline Phosphatase: 46 U/L (ref 38–126)
Anion gap: 9 (ref 5–15)
BUN: 19 mg/dL (ref 8–23)
CO2: 26 mmol/L (ref 22–32)
Calcium: 9.1 mg/dL (ref 8.9–10.3)
Chloride: 98 mmol/L (ref 98–111)
Creatinine, Ser: 0.51 mg/dL (ref 0.44–1.00)
GFR, Estimated: >60 mL/min (ref >60)
Glucose, Bld: 90 mg/dL (ref 70–99)
Potassium: 4.2 mmol/L (ref 3.5–5.1)
Sodium: 133 mmol/L — ABNORMAL LOW (ref 135–145)
Total Bilirubin: 0.3 mg/dL (ref 0.3–1.2)
Total Protein: 7.5 g/dL (ref 6.5–8.1)

## 2022-05-25 NOTE — ED Triage Notes (Addendum)
Pt presents POV from home. Recently dx with cellulitis to RLE 2 days ago. Pt seen here 2 days ago, dc'd with ABX, dc instructions suggested pt should return if there was no improvement.  Pt still has 3 day supply of ABX.  Last night pt reports increase swelling, redness and pain.  Pt reports she has taken Clindamycin in the past with no problems but last night she began itching and noticed red spots

## 2022-05-25 NOTE — ED Provider Notes (Signed)
[facility] EMERGENC[facility] DEPT Provider Note   CSN: [id number] Arrival date & time: 05/25/22  1346     History  Chief Complaint  Patient presents with   Cellulitis    Carolyn Henderson is a 88 y.o. female.  Patient seen August 7 pictures were taken at that time which was helpful for his cellulitis and a wound to her right lower extremity.  Patient's been on clindamycin since that time.  Patient felt last night that it was looking worse.  But does admit today it seems to be better the swelling is gone down.  Patient denies any fevers or any increased pain.        Home Medications Prior to Admission medications   Medication Sig Start Date End Date Taking? Authorizing Provider  acetaminophen (TYLENOL) 325 MG tablet Take 2 tablets (650 mg total) by mouth every 6 (six) hours as needed for mild pain (or Fever >/= 101). 08/05/14   Danae Orleans, PA-C  amLODipine (NORVASC) 2.5 MG tablet Take 1 tablet (2.5 mg total) by mouth daily. 10/21/15   Burchette, Alinda Sierras, MD  benzonatate (TESSALON) 100 MG capsule Take 1 capsule (100 mg total) by mouth 3 (three) times daily as needed for cough. 01/17/16   Burchette, Alinda Sierras, MD  Calcium Carbonate-Vit D-Min (CALCIUM 1200 PO) Take by mouth.    [provider]  Carboxymethylcell-Hypromellose (GENTEAL OP) Apply 1 drop to eye at bedtime.    [provider]  cetirizine (ZYRTEC) 10 MG tablet Take 10 mg by mouth at bedtime.     [provider]  cholecalciferol (VITAMIN D) 1000 UNITS tablet Take 1,000 Units by mouth daily.    [provider]  clindamycin (CLEOCIN) 150 MG capsule Take 3 capsules (450 mg total) by mouth 3 (three) times daily for 5 days. 05/21/22 05/26/22  Sherwood Gambler, MD  clobetasol ointment (TEMOVATE) 8.78 % Apply 1 application topically 2 (two) times daily as needed (for eczema).    [provider]  clotrimazole-betamethasone (LOTRISONE) cream Apply 1 application topically 2 (two) times  daily. Patient taking differently: Apply 1 application topically 2 (two) times daily as needed (itching, eczema).  10/27/15   Burchette, Alinda Sierras, MD  diphenhydrAMINE (BENADRYL) 25 MG tablet Take 25-50 mg by mouth every 6 (six) hours as needed for itching or allergies.     [provider]  doxycycline (VIBRA-TABS) 100 MG tablet Take 1 tablet (100 mg total) by mouth 2 (two) times daily. Patient not taking: Reported on 04/20/2016 01/17/16   Eulas Post, MD  ferrous sulfate 324 (65 Fe) MG TBEC Take 324 mg by mouth daily. 04/16/16   [provider]  gabapentin (NEURONTIN) 300 MG capsule TAKE 1 TO 3 CAPSULES BY MOUTH EVERY DAY AS DIRECTED Patient taking differently: Take 300 mg by mouth 3 (three) times daily.  08/18/15   Dorena Cookey, MD  ibuprofen (ADVIL) 400 MG tablet Take 1 tablet (400 mg total) by mouth every 6 (six) hours as needed for up to 20 doses. 03/12/20   Fawze, Mina A, PA-C  Multiple Vitamins-Minerals (PRESERVISION AREDS 2) CAPS Take 1 capsule by mouth 2 (two) times daily.     [provider]  Omega-3 Fatty Acids (FISH OIL PO) Take 1 capsule by mouth 2 (two) times daily.    [provider]  ondansetron (ZOFRAN ODT) 4 MG disintegrating tablet '4mg'$  ODT q4 hours prn nausea/vomit 04/20/16   Deno Etienne, DO  ondansetron (ZOFRAN ODT) 4 MG disintegrating tablet '4mg'$  ODT  q4 hours prn nausea/vomit 04/20/16   Deno Etienne, DO  ondansetron (ZOFRAN) 4 MG tablet Take 1 tablet (4 mg total) by mouth every 8 (eight) hours as needed for nausea or vomiting. 05/09/22   Tegeler, Gwenyth Allegra, MD  oxyCODONE (ROXICODONE) 5 MG immediate release tablet Take 0.5 tablets (2.5 mg total) by mouth every 4 (four) hours as needed for severe pain. 04/20/16   Deno Etienne, DO  oxyCODONE (ROXICODONE) 5 MG immediate release tablet Take 0.5 tablets (2.5 mg total) by mouth every 4 (four) hours as needed for severe pain. 04/20/16   Deno Etienne, DO  Polyethyl Glycol-Propyl Glycol (SYSTANE OP) Apply 1 drop to  eye 3 (three) times daily as needed (for dry eyes).     [provider]  Probiotic Product (ACIDOPHILUS/GOAT MILK) CAPS Take 1 capsule by mouth every evening.    [provider]  rOPINIRole (REQUIP) 0.25 MG tablet Take 1 tablet by mouth at  bedtime Patient taking differently: Take 0.'5mg'$  by mouth at  bedtime for restless legs 03/19/16   Eulas Post, MD  SYNTHROID 125 MCG tablet TAKE 1 TABLET BY MOUTH EVERY MORNING BEFORE BREAKFAST 10/01/16   Burchette, Alinda Sierras, MD  tacrolimus (PROTOPIC) 0.1 % ointment Apply 0.1 % topically daily as needed (for itchy eyelids).     [provider]  vitamin B-12 (CYANOCOBALAMIN) 1000 MCG tablet Take 1,000 mcg by mouth daily.    [provider]      Allergies    Cephalosporins, Codeine sulfate, Hydrocodone, Percocet [oxycodone-acetaminophen], Tramadol, and Penicillins    Review of Systems   Review of Systems  Constitutional:  Negative for chills and fever.  HENT:  Negative for ear pain and sore throat.   Eyes:  Negative for pain and visual disturbance.  Respiratory:  Negative for cough and shortness of breath.   Cardiovascular:  Positive for leg swelling. Negative for chest pain and palpitations.  Gastrointestinal:  Negative for abdominal pain and vomiting.  Genitourinary:  Negative for dysuria and hematuria.  Musculoskeletal:  Negative for arthralgias and back pain.  Skin:  Positive for wound. Negative for color change and rash.  Neurological:  Negative for seizures and syncope.  All other systems reviewed and are negative.   Physical Exam Updated Vital Signs BP (!) 161/74 (BP Location: Left Arm)   Pulse 63   Temp 98.7 F (37.1 C) (Oral)   Resp 16   SpO2 100%  Physical Exam Vitals and nursing note reviewed.  Constitutional:      General: She is not in acute distress.    Appearance: Normal appearance. She is well-developed.  HENT:     Head: Normocephalic and atraumatic.  Eyes:     Extraocular Movements:  Extraocular movements intact.     Conjunctiva/sclera: Conjunctivae normal.     Pupils: Pupils are equal, round, and reactive to light.  Cardiovascular:     Rate and Rhythm: Normal rate and regular rhythm.     Heart sounds: No murmur heard. Pulmonary:     Effort: Pulmonary effort is normal. No respiratory distress.     Breath sounds: Normal breath sounds.  Abdominal:     Palpations: Abdomen is soft.     Tenderness: There is no abdominal tenderness.  Musculoskeletal:        General: No swelling.     Cervical back: Normal range of motion and neck supple.     Right lower leg: Edema present.     Left lower leg: No edema.  Comments: Patient with about a 1 to 2 cm wound to the medial aspect of the right distal leg.  Very minimal erythema in the area seems significantly improved compared to the pictures from August 7.  Slight swelling.  Good cap refill to the toes.  Skin:    General: Skin is warm and dry.     Capillary Refill: Capillary refill takes less than 2 seconds.  Neurological:     General: No focal deficit present.     Mental Status: She is alert and oriented to person, place, and time.     Cranial Nerves: No cranial nerve deficit.     Sensory: No sensory deficit.  Psychiatric:        Mood and Affect: Mood normal.     ED Results / Procedures / Treatments   Labs (all labs ordered are listed, but only abnormal results are displayed) Labs Reviewed  COMPREHENSIVE METABOLIC PANEL - Abnormal; Notable for the following components:      Result Value   Sodium 133 (*)    All other components within normal limits  CBC WITH DIFFERENTIAL/PLATELET    EKG None  Radiology No results found.  Procedures Procedures    Medications Ordered in ED Medications - No data to display  ED Course/ Medical Decision Making/ A&P                           Medical Decision Making Amount and/or Complexity of Data Reviewed Labs: ordered.   Overall right leg cellulitis seems to be  improving significantly.  Patient reassured.  She will continue clindamycin.  She will watch out for increased pain increased redness or fever.  Recommend follow-up with primary care provider for recheck.   Final Clinical Impression(s) / ED Diagnoses Final diagnoses:  Cellulitis of right lower extremity    Rx / DC Orders ED Discharge Orders     None         Fredia Sorrow, MD 05/25/22 1902

## 2022-05-25 NOTE — Discharge Instructions (Signed)
Continue the clindamycin antibiotic as directed.  The wound does and cellulitis appear to be healing.  No signs of anything significantly worse today.  Continue to elevate the right leg is much as possible.  Return for spreading redness or fevers.  Make an appointment to follow-up with your primary care doctor.

## 2022-05-26 ENCOUNTER — Encounter (HOSPITAL_BASED_OUTPATIENT_CLINIC_OR_DEPARTMENT_OTHER): Payer: Self-pay | Admitting: Emergency Medicine

## 2022-05-26 ENCOUNTER — Other Ambulatory Visit: Payer: Self-pay

## 2022-05-26 ENCOUNTER — Emergency Department (HOSPITAL_BASED_OUTPATIENT_CLINIC_OR_DEPARTMENT_OTHER)
Admission: EM | Admit: 2022-05-26 | Discharge: 2022-05-26 | Disposition: A | Discharge status: Home / Self Care | Payer: Medicare Other | Attending: Emergency Medicine | Admitting: Emergency Medicine

## 2022-05-26 DIAGNOSIS — I10 Essential (primary) hypertension: Secondary | ICD-10-CM | POA: Diagnosis not present

## 2022-05-26 DIAGNOSIS — L989 Disorder of the skin and subcutaneous tissue, unspecified: Secondary | ICD-10-CM | POA: Insufficient documentation

## 2022-05-26 DIAGNOSIS — Z79899 Other long term (current) drug therapy: Secondary | ICD-10-CM | POA: Diagnosis not present

## 2022-05-26 LAB — BASIC METABOLIC PANEL
Anion gap: 8 (ref 5–15)
BUN: 21 mg/dL (ref 8–23)
CO2: 25 mmol/L (ref 22–32)
Calcium: 8.5 mg/dL — ABNORMAL LOW (ref 8.9–10.3)
Chloride: 99 mmol/L (ref 98–111)
Creatinine, Ser: 0.66 mg/dL (ref 0.44–1.00)
GFR, Estimated: >60 mL/min (ref >60)
Glucose, Bld: 99 mg/dL (ref 70–99)
Potassium: 4.3 mmol/L (ref 3.5–5.1)
Sodium: 132 mmol/L — ABNORMAL LOW (ref 135–145)

## 2022-05-26 LAB — CBC
HCT: 34.2 % — ABNORMAL LOW (ref 36.0–46.0)
Hemoglobin: 11 g/dL — ABNORMAL LOW (ref 12.0–15.0)
MCH: 28.9 pg (ref 26.0–34.0)
MCHC: 32.2 g/dL (ref 30.0–36.0)
MCV: 90 fL (ref 80.0–100.0)
Platelets: 329 10*3/uL (ref 150–400)
RBC: 3.8 MIL/uL — ABNORMAL LOW (ref 3.87–5.11)
RDW: 16.1 % — ABNORMAL HIGH (ref 11.5–15.5)
WBC: 6 10*3/uL (ref 4.0–10.5)
nRBC: 0 % (ref 0.0–0.2)

## 2022-05-26 NOTE — ED Provider Notes (Signed)
[facility]ERGENCY DEPT Provider Note   CSN: [id number] Arrival date & time: 05/26/22  7564     History  Chief Complaint  Patient presents with   Blister    Carolyn Henderson is a 88 y.o. female.  HPI 88 year old female with a right lower extremity wound and subsequent cellulitis diagnosed on August 7 and treated with clindamycin.  Patient was seen and evaluated again yesterday when she thought she has some increased swelling in this area.  At that time it was unchanged.  She continue her clindamycin which she completed today.  This evening she got in the shower and had a sudden onset of a blistered area that she described as burning on her left forearm.  She was sent to the ED by her facility    Home Medications Prior to Admission medications   Medication Sig Start Date End Date Taking? Authorizing Provider  acetaminophen (TYLENOL) 325 MG tablet Take 2 tablets (650 mg total) by mouth every 6 (six) hours as needed for mild pain (or Fever >/= 101). 08/05/14   Danae Orleans, PA-C  amLODipine (NORVASC) 2.5 MG tablet Take 1 tablet (2.5 mg total) by mouth daily. 10/21/15   Burchette, Alinda Sierras, MD  benzonatate (TESSALON) 100 MG capsule Take 1 capsule (100 mg total) by mouth 3 (three) times daily as needed for cough. 01/17/16   Burchette, Alinda Sierras, MD  Calcium Carbonate-Vit D-Min (CALCIUM 1200 PO) Take by mouth.    [provider]  Carboxymethylcell-Hypromellose (GENTEAL OP) Apply 1 drop to eye at bedtime.    [provider]  cetirizine (ZYRTEC) 10 MG tablet Take 10 mg by mouth at bedtime.     [provider]  cholecalciferol (VITAMIN D) 1000 UNITS tablet Take 1,000 Units by mouth daily.    [provider]  clindamycin (CLEOCIN) 150 MG capsule Take 3 capsules (450 mg total) by mouth 3 (three) times daily for 5 days. 05/21/22 05/26/22  Sherwood Gambler, MD  clobetasol ointment (TEMOVATE) 3.32 % Apply 1 application topically 2 (two) times daily as  needed (for eczema).    [provider]  clotrimazole-betamethasone (LOTRISONE) cream Apply 1 application topically 2 (two) times daily. Patient taking differently: Apply 1 application topically 2 (two) times daily as needed (itching, eczema).  10/27/15   Burchette, Alinda Sierras, MD  diphenhydrAMINE (BENADRYL) 25 MG tablet Take 25-50 mg by mouth every 6 (six) hours as needed for itching or allergies.     [provider]  doxycycline (VIBRA-TABS) 100 MG tablet Take 1 tablet (100 mg total) by mouth 2 (two) times daily. Patient not taking: Reported on 04/20/2016 01/17/16   Eulas Post, MD  ferrous sulfate 324 (65 Fe) MG TBEC Take 324 mg by mouth daily. 04/16/16   [provider]  gabapentin (NEURONTIN) 300 MG capsule TAKE 1 TO 3 CAPSULES BY MOUTH EVERY DAY AS DIRECTED Patient taking differently: Take 300 mg by mouth 3 (three) times daily.  08/18/15   Dorena Cookey, MD  ibuprofen (ADVIL) 400 MG tablet Take 1 tablet (400 mg total) by mouth every 6 (six) hours as needed for up to 20 doses. 03/12/20   Fawze, Mina A, PA-C  Multiple Vitamins-Minerals (PRESERVISION AREDS 2) CAPS Take 1 capsule by mouth 2 (two) times daily.     [provider]  Omega-3 Fatty Acids (FISH OIL PO) Take 1 capsule by mouth 2 (two) times daily.    [provider]  ondansetron (ZOFRAN ODT) 4 MG disintegrating tablet '4mg'$   ODT q4 hours prn nausea/vomit 04/20/16   Deno Etienne, DO  ondansetron (ZOFRAN ODT) 4 MG disintegrating tablet '4mg'$  ODT q4 hours prn nausea/vomit 04/20/16   Deno Etienne, DO  ondansetron (ZOFRAN) 4 MG tablet Take 1 tablet (4 mg total) by mouth every 8 (eight) hours as needed for nausea or vomiting. 05/09/22   Tegeler, Gwenyth Allegra, MD  oxyCODONE (ROXICODONE) 5 MG immediate release tablet Take 0.5 tablets (2.5 mg total) by mouth every 4 (four) hours as needed for severe pain. 04/20/16   Deno Etienne, DO  oxyCODONE (ROXICODONE) 5 MG immediate release tablet Take 0.5 tablets (2.5 mg total) by  mouth every 4 (four) hours as needed for severe pain. 04/20/16   Deno Etienne, DO  Polyethyl Glycol-Propyl Glycol (SYSTANE OP) Apply 1 drop to eye 3 (three) times daily as needed (for dry eyes).     [provider]  Probiotic Product (ACIDOPHILUS/GOAT MILK) CAPS Take 1 capsule by mouth every evening.    [provider]  rOPINIRole (REQUIP) 0.25 MG tablet Take 1 tablet by mouth at  bedtime Patient taking differently: Take 0.'5mg'$  by mouth at  bedtime for restless legs 03/19/16   Eulas Post, MD  SYNTHROID 125 MCG tablet TAKE 1 TABLET BY MOUTH EVERY MORNING BEFORE BREAKFAST 10/01/16   Burchette, Alinda Sierras, MD  tacrolimus (PROTOPIC) 0.1 % ointment Apply 0.1 % topically daily as needed (for itchy eyelids).     [provider]  vitamin B-12 (CYANOCOBALAMIN) 1000 MCG tablet Take 1,000 mcg by mouth daily.    [provider]      Allergies    Cephalosporins, Codeine sulfate, Hydrocodone, Percocet [oxycodone-acetaminophen], Tramadol, and Penicillins    Review of Systems   Review of Systems  Physical Exam Updated Vital Signs BP (!) 193/92 (BP Location: Right Arm)   Pulse 77   Temp 98.4 F (36.9 C)   Resp 18   SpO2 99%  Physical Exam Vitals and nursing note reviewed.  Constitutional:      Appearance: Normal appearance.  HENT:     Head: Normocephalic.     Right Ear: External ear normal.     Left Ear: External ear normal.     Nose: Nose normal.     Mouth/Throat:     Pharynx: Oropharynx is clear.  Eyes:     Extraocular Movements: Extraocular movements intact.     Conjunctiva/sclera: Conjunctivae normal.  Cardiovascular:     Rate and Rhythm: Normal rate.  Pulmonary:     Effort: Pulmonary effort is normal.  Abdominal:     General: Abdomen is flat.  Musculoskeletal:        General: Normal range of motion.     Cervical back: Normal range of motion.     Comments: Right lower extremity with eschar and mild surrounding erythema compared to picture obtained  on the seventh that is unchanged. Her left upper extremity has a 4 x 2 cm erythematous streaking area with no fluctuance, tenderness, or warmth noted.   Skin:    General: Skin is warm and dry.  Neurological:     General: No focal deficit present.  Psychiatric:        Mood and Affect: Mood normal.     ED Results / Procedures / Treatments   Labs (all labs ordered are listed, but only abnormal results are displayed) Labs Reviewed  CBC - Abnormal; Notable for the following components:      Result Value   RBC 3.80 (*)    Hemoglobin  11.0 (*)    HCT 34.2 (*)    RDW 16.1 (*)    All other components within normal limits  BASIC METABOLIC PANEL - Abnormal; Notable for the following components:   Sodium 132 (*)    Calcium 8.5 (*)    All other components within normal limits    EKG None  Radiology No results found.  Procedures Procedures    Medications Ordered in ED Medications - No data to display  ED Course/ Medical Decision Making/ A&P Clinical Course as of 05/26/22 2048  Sat May 26, 2022  4982 Basic metabolic panel reviewed and interpreted and significant for mild hyponatremia with sodium 132 which is unchanged from prior [DR]  2040 [facility] reviewed and interpreted with stable anemia [DR]    Clinical Course User Index [DR] Pattricia Boss, MD                           Medical Decision Making 88 year old female with recent right lower extremity infection which has been treated with clindamycin until today.  It has remained stable.  She has not had fever or chills.  She has a new lesion to her left upper extremity.  It is unclear if this was secondary to water burning.  There is no evidence of infection at the site. Differential diagnosis includes is not limited to infection, allergic reaction, shingles, heat injury Patient had labs checked that are normal. Vital signs are stable with the exception of known hypertension with blood pressure elevated here at 193/92 plan to  recheck Plan conservative therapy of skin lesion on left upper extremity Do not see any indication to continue her antibiotics at this time Appears stable for discharge   Amount and/or Complexity of Data Reviewed Labs: ordered. Decision-making details documented in ED Course.           Final Clinical Impression(s) / ED Diagnoses Final diagnoses:  Skin lesion of left arm    Rx / DC Orders ED Discharge Orders     None         Pattricia Boss, MD 05/26/22 2048

## 2022-05-26 NOTE — ED Triage Notes (Addendum)
Blisteron left inner elbow, notice today when showering.  "Its started drain" Wrapped with gauze. Was seen yesterday for cellulitis in right leg.  From [place] [place]. Assisted living

## 2022-05-26 NOTE — ED Notes (Signed)
Discharge paperwork given and verbally understood. 

## 2022-05-26 NOTE — Discharge Instructions (Addendum)
Please keep area warm and dry Observe for any spreading of rash Recheck with your doctor next week

## 2022-07-16 ENCOUNTER — Ambulatory Visit (HOSPITAL_BASED_OUTPATIENT_CLINIC_OR_DEPARTMENT_OTHER): Payer: Medicare Other | Admitting: Family Medicine

## 2022-08-20 ENCOUNTER — Encounter (HOSPITAL_BASED_OUTPATIENT_CLINIC_OR_DEPARTMENT_OTHER): Payer: Self-pay | Admitting: Emergency Medicine

## 2022-08-20 ENCOUNTER — Other Ambulatory Visit: Payer: Self-pay

## 2022-08-20 ENCOUNTER — Emergency Department (HOSPITAL_BASED_OUTPATIENT_CLINIC_OR_DEPARTMENT_OTHER)
Admission: EM | Admit: 2022-08-20 | Discharge: 2022-08-20 | Disposition: A | Discharge status: Home / Self Care | Payer: Medicare Other | Attending: Emergency Medicine | Admitting: Emergency Medicine

## 2022-08-20 DIAGNOSIS — R197 Diarrhea, unspecified: Secondary | ICD-10-CM | POA: Diagnosis present

## 2022-08-20 LAB — COMPREHENSIVE METABOLIC PANEL
ALT: 12 U/L (ref 0–44)
AST: 23 U/L (ref 15–41)
Albumin: 4 g/dL (ref 3.5–5.0)
Alkaline Phosphatase: 42 U/L (ref 38–126)
Anion gap: 9 (ref 5–15)
BUN: 13 mg/dL (ref 8–23)
CO2: 26 mmol/L (ref 22–32)
Calcium: 9 mg/dL (ref 8.9–10.3)
Chloride: 96 mmol/L — ABNORMAL LOW (ref 98–111)
Creatinine, Ser: 0.52 mg/dL (ref 0.44–1.00)
GFR, Estimated: >60 mL/min (ref >60)
Glucose, Bld: 97 mg/dL (ref 70–99)
Potassium: 4.1 mmol/L (ref 3.5–5.1)
Sodium: 131 mmol/L — ABNORMAL LOW (ref 135–145)
Total Bilirubin: 0.4 mg/dL (ref 0.3–1.2)
Total Protein: 7.2 g/dL (ref 6.5–8.1)

## 2022-08-20 LAB — LIPASE, BLOOD: Lipase: 39 U/L (ref 11–51)

## 2022-08-20 LAB — CBC
HCT: 41.7 % (ref 36.0–46.0)
Hemoglobin: 13.5 g/dL (ref 12.0–15.0)
MCH: 30.3 pg (ref 26.0–34.0)
MCHC: 32.4 g/dL (ref 30.0–36.0)
MCV: 93.5 fL (ref 80.0–100.0)
Platelets: 301 10*3/uL (ref 150–400)
RBC: 4.46 MIL/uL (ref 3.87–5.11)
RDW: 15 % (ref 11.5–15.5)
WBC: 6.3 10*3/uL (ref 4.0–10.5)
nRBC: 0 % (ref 0.0–0.2)

## 2022-08-20 MED ORDER — DICYCLOMINE HCL 10 MG PO CAPS
10.0000 mg | ORAL_CAPSULE | Freq: Once | ORAL | Status: AC
  Administered 2022-08-20: 10 mg via ORAL
  Filled 2022-08-20: qty 1

## 2022-08-20 MED ORDER — LACTATED RINGERS IV BOLUS
1000.0000 mL | Freq: Once | INTRAVENOUS | Status: AC
  Administered 2022-08-20: 1000 mL via INTRAVENOUS

## 2022-08-20 NOTE — ED Triage Notes (Signed)
Pt via pov from home with diarrhea that started 10 days ago; it got better, but returned 3 days ago, worsening today. Pt states she has had medication for the same that has helped but that it isn't helping today. Pt alert & oriented, nad noted.

## 2022-08-20 NOTE — ED Provider Notes (Signed)
[facility] Provider Note   CSN: [id number] Arrival date & time: 08/20/22  1225     History Chief Complaint  Patient presents with   Diarrhea    HPI Carolyn Henderson is a 88 y.o. female presenting for chief complaint of nonbilious nonbloody diarrhea.  She had approximately 3 episodes today.  She has a history of IBS but today her diarrhea was more prolific than normal.  She states it took 2 hours to clean herself up afterwards.  She denies fevers or chills, nausea vomiting, syncope shortness of breath.  He is otherwise ambulatory tolerating p.o. intake.  She is currently asymptomatic. Endorses a history of similar episodes with Bentyl prescribed at baseline. No known sick contacts.   Patient's recorded medical, surgical, social, medication list and allergies were reviewed in the Snapshot window as part of the initial history.   Review of Systems   Review of Systems  Constitutional:  Negative for chills and fever.  HENT:  Negative for ear pain and sore throat.   Eyes:  Negative for pain and visual disturbance.  Respiratory:  Negative for cough and shortness of breath.   Cardiovascular:  Negative for chest pain and palpitations.  Gastrointestinal:  Positive for diarrhea. Negative for abdominal pain and vomiting.  Genitourinary:  Negative for dysuria and hematuria.  Musculoskeletal:  Negative for arthralgias and back pain.  Skin:  Negative for color change and rash.  Neurological:  Negative for seizures and syncope.  All other systems reviewed and are negative.   Physical Exam Updated Vital Signs BP (!) 192/89   Pulse 66   Temp 98.2 F (36.8 C) (Oral)   Resp 13   Ht '5\' 3"'$  (1.6 m)   Wt 62.1 kg   SpO2 96%   BMI 24.25 kg/m  Physical Exam Vitals and nursing note reviewed.  Constitutional:      General: She is not in acute distress.    Appearance: She is well-developed.  HENT:     Head: Normocephalic and atraumatic.  Eyes:      Conjunctiva/sclera: Conjunctivae normal.  Cardiovascular:     Rate and Rhythm: Normal rate and regular rhythm.     Heart sounds: No murmur heard. Pulmonary:     Effort: Pulmonary effort is normal. No respiratory distress.     Breath sounds: Normal breath sounds.  Abdominal:     General: There is no distension.     Palpations: Abdomen is soft.     Tenderness: There is no abdominal tenderness. There is no right CVA tenderness or left CVA tenderness.  Musculoskeletal:        General: No swelling or tenderness. Normal range of motion.     Cervical back: Neck supple.  Skin:    General: Skin is warm and dry.  Neurological:     General: No focal deficit present.     Mental Status: She is alert and oriented to person, place, and time. Mental status is at baseline.     Cranial Nerves: No cranial nerve deficit.      ED Course/ Medical Decision Making/ A&P Clinical Course as of 08/20/22 1847  Mon Aug 20, 2022  1825 DC [CC]    Clinical Course User Index [CC] Tretha Sciara, MD    Procedures Procedures   Medications Ordered in ED Medications  lactated ringers bolus 1,000 mL (0 mLs Intravenous Stopped 08/20/22 1806)  dicyclomine (BENTYL) capsule 10 mg (10 mg Oral Given 08/20/22 1628)    Medical Decision Making:  Carolyn Henderson is a 88 y.o. female who presented to the ED today with bilious nonbloody diarrhea detailed above.     Patient's presentation is complicated by their history of multiple comorbid medical problems.  Patient placed on continuous vitals and telemetry monitoring while in ED which was reviewed periodically.   Complete initial physical exam performed, notably the patient  was HDS in NAD.      Reviewed and confirmed nursing documentation for past medical history, family history, social history.    Initial Assessment:   With the patient's presentation of chief complaint of diarrhea.  She is now attributing it to her IBS, most likely diagnosis is recurrence of  her IBS. Other diagnoses were considered including (but not limited to) more substantial disease including IBD, C. difficile, bacterial colonic infection. These are considered less likely due to history of present illness and physical exam findings.   This is most consistent with an acute life/limb threatening illness complicated by underlying chronic conditions.  Initial Plan:  Screening labs including CBC and Metabolic panel to evaluate for infectious or metabolic etiology of disease.  Urinalysis with reflex culture ordered to evaluate for UTI or relevant urologic/nephrologic pathology.  Objective evaluation as below reviewed with plan for close reassessment  Initial Study Results:   Laboratory  All laboratory results reviewed without evidence of clinically relevant pathology.    Final Assessment and Plan:   Reassessment after IV fluids and administration of her home Bentyl and patient is symptomatically resolved.  She is hypertensive, discussed this with her, she says that she is always hypertensive and needs to take her blood pressure medications which are due when she gets home.  Given her overall well appearance, I do not believe there is any acute indication for intervention at this time.  She is ambulatory tolerating p.o. intake and has been observed in the emergency department for 7 hours without any worsening symptoms.  Likely recurrence of her IBS given her diagnosis of similar and no further stool episodes since she has been here in the emergency department.  She will follow-up with her primary care provider within 2 days as discussed otherwise no acute indication for intervention this evening.   Disposition:  I have considered need for hospitalization, however, considering all of the above, I believe this patient is stable for discharge at this time.  Patient/family educated about specific return precautions for given chief complaint and symptoms.  Patient/family educated about  follow-up with PCP.     Patient/family expressed understanding of return precautions and need for follow-up. Patient spoken to regarding all imaging and laboratory results and appropriate follow up for these results. All education provided in verbal form with additional information in written form. Time was allowed for answering of patient questions. Patient discharged.    Emergency Department Medication Summary:   Medications  lactated ringers bolus 1,000 mL (0 mLs Intravenous Stopped 08/20/22 1806)  dicyclomine (BENTYL) capsule 10 mg (10 mg Oral Given 08/20/22 1628)         Clinical Impression:  1. Diarrhea of presumed infectious origin      Discharge   Final Clinical Impression(s) / ED Diagnoses Final diagnoses:  Diarrhea of presumed infectious origin    Rx / DC Orders ED Discharge Orders     None         Tretha Sciara, MD 08/20/22 [phone]

## 2023-06-14 ENCOUNTER — Ambulatory Visit (INDEPENDENT_AMBULATORY_CARE_PROVIDER_SITE_OTHER): Payer: Medicare Other | Admitting: Cardiology

## 2023-06-14 ENCOUNTER — Encounter (HOSPITAL_BASED_OUTPATIENT_CLINIC_OR_DEPARTMENT_OTHER): Payer: Self-pay | Admitting: Cardiology

## 2023-06-14 VITALS — BP 112/74 | HR 83 | Ht 63.0 in | Wt 131.5 lb

## 2023-06-14 DIAGNOSIS — I491 Atrial premature depolarization: Secondary | ICD-10-CM | POA: Diagnosis not present

## 2023-06-14 DIAGNOSIS — I4510 Unspecified right bundle-branch block: Secondary | ICD-10-CM

## 2023-06-14 DIAGNOSIS — I10 Essential (primary) hypertension: Secondary | ICD-10-CM

## 2023-06-14 DIAGNOSIS — R011 Cardiac murmur, unspecified: Secondary | ICD-10-CM

## 2023-06-14 DIAGNOSIS — R600 Localized edema: Secondary | ICD-10-CM

## 2023-06-14 NOTE — Progress Notes (Signed)
Cardiology Office Note:  .    Date:  06/14/2023  ID:  Carolyn Henderson, DOB [birth year], MRN [id number] PCP: April Manson, NP  [facility] Providers Cardiologist:  Jodelle Red, MD     History of Present Illness: .    Carolyn Henderson is a 89 y.o. female with a hx of PAC's, RBBB, hypertension, hypothyroidism, iron deficiency anemia, COPD, here for the evaluation of localized edema.   Referral notes from Caryl Comes, FNP personally reviewed. The patient has struggled with progressively worsening ankle swelling for years, was recently unresponsive to lasix 20 mg for 3 days. She was switched to Bumex. She was referred to cardiology for further evaluation.   Today she is accompanied by a family member.   Cardiovascular risk factors: Prior clinical ASCVD: None. Comorbid conditions:  Hypertension - In the office her BP is 112/74 on amlodipine 2.5 mg daily. Hypothyroidism - on Synthroid 112 mcg daily. COPD - breathing has been stable this past Summer. Also has restless legs syndrome. Metabolic syndrome/Obesity:  Current weight 131 lbs. Chronic inflammatory conditions: None. Tobacco use history: Former smoker, previously smoked 0.5 packs/day for 10 years.  Family history: Not discussed. Prior pertinent testing and/or incidental findings: LE Venous ultrasound 06/05/2023 was negative for DVT. She had an EKG 06/03/23 showing NSR with rare PAC and RBBB - stable from prior.  Current diet: Of note, she often adds salt to her meals.   For years she confirms having intermittent issues with ankle swelling. This occurs more frequently in the warmer months with higher heat/humidity. She also had a right ankle injury a few years ago from closing the car door on her right ankle.  Currently she has had ankle swelling for a few weeks now. She confirms that Bumex seems to have helped improve her swelling, but she doesn't take it consistently. She will take one every couple of days depending on  the level of swelling, initially with temporary resolution of her swelling.  She has diabetic socks at home. Used to have compression stockings but they caused bruising to her legs.   She denies any palpitations, chest pain, lightheadedness, headaches, syncope, orthopnea, or PND.  ROS:  Please see the history of present illness. ROS otherwise negative except as noted.  (+) BLE edema to the ankles  Studies Reviewed: Marland Kitchen    EKG Interpretation Date/Time:  Friday June 14 2023 14:35:14 EDT Ventricular Rate:  83 PR Interval:  150 QRS Duration:  122 QT Interval:  380 QTC Calculation: 446 R Axis:   -27  Text Interpretation: Sinus rhythm with Premature supraventricular complexes Non-specific intra-ventricular conduction delay Non-specific ST-t changes Confirmed by Jodelle Red [phone]) on 06/14/2023 2:42:22 PM    LE Venous Doppler  06/05/2023  ([facility]): FINDINGS: Bilateral common femoral, femoral, popliteal and posterior tibial veins patent. No clot identified. Augmentation venous return. Proximal greater saphenous vein patent.   IMPRESSION: No DVT identified.   Physical Exam:    VS:  BP 112/74   Pulse 83   Ht 5\' 3"  (1.6 m)   Wt 131 lb 8 oz (59.6 kg)   SpO2 97%   BMI 23.29 kg/m    Wt Readings from Last 3 Encounters:  06/14/23 131 lb 8 oz (59.6 kg)  08/20/22 136 lb 14.5 oz (62.1 kg)  05/21/22 136 lb 14.5 oz (62.1 kg)    GEN: Well nourished, well developed in no acute distress HEENT: Normal, moist mucous membranes NECK: No JVD CARDIAC: regular rhythm, normal S1 and S2,  no rubs or gallops. 1/6 soft murmur. VASCULAR: Radial and DP pulses 2+ bilaterally. No carotid bruits RESPIRATORY:  Clear to auscultation without rales, wheezing or rhonchi  ABDOMEN: Soft, non-tender, non-distended MUSCULOSKELETAL:  Ambulates independently SKIN: Warm and dry, 1+ ankle edema bilaterally R>L. NEUROLOGIC:  Alert and oriented x 3. No focal neuro deficits noted. PSYCHIATRIC:  Normal affect    ASSESSMENT AND PLAN: .    Edema Murmur -will order echocardiogram -if echo unremarkable, suspect component of venous insufficiency  PACs RBBB -asymptomatic  Hypertension -well controlled  Dispo: Follow-up in early January, or sooner as needed.  I,Mathew Stumpf,acting as a Neurosurgeon for [employer], MD.,have documented all relevant documentation on the behalf of Jodelle Red, MD,as directed by  Jodelle Red, MD while in the presence of Jodelle Red, MD.  I, Jodelle Red, MD, have reviewed all documentation for this visit. The documentation on 07/29/23 for the exam, diagnosis, procedures, and orders are all accurate and complete.   Signed, Jodelle Red, MD

## 2023-06-14 NOTE — Patient Instructions (Signed)
Medication Instructions:  The current medical regimen is effective;  continue present plan and medications.   *If you need a refill on your cardiac medications before your next appointment, please call your pharmacy*   Lab Work: None   Testing/Procedures:  Your physician has requested that you have an echocardiogram. Echocardiography is a painless test that uses sound waves to create images of your heart. It provides your doctor with information about the size and shape of your heart and how well your heart's chambers and valves are working. This procedure takes approximately one hour. There are no restrictions for this procedure. Please do NOT wear cologne, perfume, aftershave, or lotions (deodorant is allowed). Please arrive 15 minutes prior to your appointment time.    Follow-Up: At [facility], you and your health needs are our priority.  As part of our continuing mission to provide you with exceptional heart care, we have created designated Provider Care Teams.  These Care Teams include your primary Cardiologist (physician) and Advanced Practice Providers (APPs -  Physician Assistants and Nurse Practitioners) who all work together to provide you with the care you need, when you need it.  We recommend signing up for the patient portal called "MyChart".  Sign up information is provided on this After Visit Summary.  MyChart is used to connect with patients for Virtual Visits (Telemedicine).  Patients are able to view lab/test results, encounter notes, upcoming appointments, etc.  Non-urgent messages can be sent to your provider as well.   To learn more about what you can do with MyChart, go to [email or web address].    Your next appointment:   January 2025  Provider:   Jodelle Red, MD    Other Instructions None

## 2023-07-05 ENCOUNTER — Ambulatory Visit (HOSPITAL_BASED_OUTPATIENT_CLINIC_OR_DEPARTMENT_OTHER): Payer: Medicare Other

## 2023-07-05 DIAGNOSIS — R600 Localized edema: Secondary | ICD-10-CM | POA: Diagnosis not present

## 2023-07-05 LAB — ECHOCARDIOGRAM COMPLETE
Area-P 1/2: 3.91 cm2
MV M vel: 3.82 m/s
MV Peak grad: 58.4 mmHg
S' Lateral: 2.72 cm

## 2023-07-17 ENCOUNTER — Telehealth: Payer: Self-pay | Admitting: Cardiology

## 2023-07-17 NOTE — Telephone Encounter (Signed)
Spoke with daughter Reene and informed that we will call with results as soon as available.

## 2023-07-17 NOTE — Telephone Encounter (Signed)
Pt's daughter Reene was calling regarding pt's ECHO results and is requesting a callback. Please advise

## 2023-07-26 ENCOUNTER — Other Ambulatory Visit: Payer: Self-pay | Admitting: Neurological Surgery

## 2023-07-26 DIAGNOSIS — M5440 Lumbago with sciatica, unspecified side: Secondary | ICD-10-CM

## 2023-07-26 NOTE — Telephone Encounter (Signed)
Daughter is calling back, asking the dr to take a look at the patients results. Please advise

## 2023-07-26 NOTE — Telephone Encounter (Signed)
Will print and ask provider to review echo when able

## 2023-07-29 ENCOUNTER — Encounter (HOSPITAL_BASED_OUTPATIENT_CLINIC_OR_DEPARTMENT_OTHER): Payer: Self-pay | Admitting: Cardiology

## 2023-07-30 ENCOUNTER — Telehealth (HOSPITAL_BASED_OUTPATIENT_CLINIC_OR_DEPARTMENT_OTHER): Payer: Self-pay

## 2023-07-30 NOTE — Telephone Encounter (Addendum)
Results called to patient who verbalizes understanding!     ----- Message from [facility] sent at 07/30/2023  4:01 PM EDT ----- Echo shows normal squeeze of the heart, only very mildly stiff. Normal pressures inside the heart. No significant valve issues. Overall suggests that swelling is not due to issues with the heart.

## 2023-08-01 ENCOUNTER — Ambulatory Visit
Admission: RE | Admit: 2023-08-01 | Discharge: 2023-08-01 | Disposition: A | Discharge status: Home / Self Care | Payer: Medicare Other | Source: Ambulatory Visit | Attending: Neurological Surgery | Admitting: Neurological Surgery

## 2023-08-01 DIAGNOSIS — M5440 Lumbago with sciatica, unspecified side: Secondary | ICD-10-CM

## 2023-08-23 ENCOUNTER — Ambulatory Visit (HOSPITAL_BASED_OUTPATIENT_CLINIC_OR_DEPARTMENT_OTHER): Payer: Medicare Other | Admitting: Cardiology

## 2023-12-22 ENCOUNTER — Other Ambulatory Visit: Payer: Self-pay

## 2023-12-22 ENCOUNTER — Emergency Department (HOSPITAL_COMMUNITY)

## 2023-12-22 ENCOUNTER — Emergency Department (HOSPITAL_COMMUNITY)
Admission: EM | Admit: 2023-12-22 | Discharge: 2023-12-22 | Disposition: A | Discharge status: Home / Self Care | Attending: Emergency Medicine | Admitting: Emergency Medicine

## 2023-12-22 ENCOUNTER — Encounter (HOSPITAL_COMMUNITY): Payer: Self-pay

## 2023-12-22 DIAGNOSIS — M5416 Radiculopathy, lumbar region: Secondary | ICD-10-CM | POA: Diagnosis not present

## 2023-12-22 DIAGNOSIS — M25552 Pain in left hip: Secondary | ICD-10-CM | POA: Diagnosis present

## 2023-12-22 MED ORDER — LIDOCAINE 5 % EX PTCH
1.0000 | MEDICATED_PATCH | CUTANEOUS | Status: DC
  Administered 2023-12-22: 1 via TRANSDERMAL
  Filled 2023-12-22: qty 1

## 2023-12-22 MED ORDER — ACETAMINOPHEN 500 MG PO TABS
1000.0000 mg | ORAL_TABLET | ORAL | Status: AC
  Administered 2023-12-22: 1000 mg via ORAL
  Filled 2023-12-22: qty 2

## 2023-12-22 MED ORDER — HYDROMORPHONE HCL 1 MG/ML IJ SOLN
0.5000 mg | Freq: Once | INTRAMUSCULAR | Status: AC
  Administered 2023-12-22: 0.5 mg via SUBCUTANEOUS
  Filled 2023-12-22: qty 1

## 2023-12-22 MED ORDER — KETOROLAC TROMETHAMINE 15 MG/ML IJ SOLN
15.0000 mg | Freq: Once | INTRAMUSCULAR | Status: AC
  Administered 2023-12-22: 15 mg via INTRAMUSCULAR
  Filled 2023-12-22: qty 1

## 2023-12-22 NOTE — ED Notes (Signed)
 Pt walking well with a walker which she uses at home.  Pt states pain came back a little while up walking.

## 2023-12-22 NOTE — ED Notes (Signed)
Pt gone to xray

## 2023-12-22 NOTE — ED Triage Notes (Signed)
 Patient brought in by EMS due to left hip pain. Patient has been receiving injections in her hip for "awhile." No injuries noted, no falls. Patient was ambulatory on scene. Lives at [facility] independent living facility. A&O X 4.

## 2023-12-22 NOTE — Discharge Instructions (Signed)
 You were seen for your back pain in the emergency department.   At home, please continue Tylenol and lidocaine patches or cream.    Check your MyChart online for the results of any tests that had not resulted by the time you left the emergency department.   Follow-up with your primary doctor in 2-3 days regarding your visit.  Follow-up with your spine surgeon for a repeat injection.  Return immediately to the emergency department if you experience any of the following: Leg weakness or numbness, bowel or bladder incontinence, or any other concerning symptoms.    Thank you for visiting our Emergency Department. It was a pleasure taking care of you today.

## 2023-12-22 NOTE — ED Provider Notes (Signed)
 [place] EMERGENCY DEPARTMENT AT [facility] Provider Note   CSN: [id number] Arrival date & time: 12/22/23  1138     History  Chief Complaint  Patient presents with   Hip Pain    Carolyn Henderson is a 89 y.o. female.  89 year old female with history of chronic back pain who presents to the emergency department with left lower extremity pain.  Patient reports that she has had 4 days of atraumatic left lower extremity pain.  Radiates all the way from her hips to her toes.  Has difficulty characterizing.  10/10 in severity.  Worsened with movement.  Denies any back pain.  No leg weakness or numbness and still will walk with her walker.  No bowel or bladder incontinence.  No saddle anesthesia.  No fevers.  Not on blood thinners.  No history of cancer.  No history of indwelling catheters or IV drug use.  Has been trying Tylenol and ibuprofen without relief so decided to come into the emergency department.  Also gets injections by Dr. Danielle Dess but was not able to see him for this.       Home Medications Prior to Admission medications   Medication Sig Start Date End Date Taking? Authorizing Provider  acetaminophen (TYLENOL) 325 MG tablet Take 2 tablets (650 mg total) by mouth every 6 (six) hours as needed for mild pain (or Fever >/= 101). 08/05/14   Lanney Gins, PA-C  amLODipine (NORVASC) 2.5 MG tablet Take 1 tablet (2.5 mg total) by mouth daily. 10/21/15   Burchette, Elberta Fortis, MD  benzonatate (TESSALON) 100 MG capsule Take 1 capsule (100 mg total) by mouth 3 (three) times daily as needed for cough. 01/17/16   Burchette, Elberta Fortis, MD  Calcium Carbonate-Vit D-Min (CALCIUM 1200 PO) Take by mouth.    [provider]  Carboxymethylcell-Hypromellose (GENTEAL OP) Apply 1 drop to eye at bedtime.    [provider]  cetirizine (ZYRTEC) 10 MG tablet Take 10 mg by mouth at bedtime.     [provider]  cholecalciferol (VITAMIN D) 1000 UNITS tablet Take 1,000 Units by  mouth daily.    [provider]  clobetasol ointment (TEMOVATE) 0.05 % Apply 1 application topically 2 (two) times daily as needed (for eczema).    [provider]  clotrimazole-betamethasone (LOTRISONE) cream Apply 1 application topically 2 (two) times daily. Patient taking differently: Apply 1 application  topically 2 (two) times daily as needed (itching, eczema). 10/27/15   Burchette, Elberta Fortis, MD  diphenhydrAMINE (BENADRYL) 25 MG tablet Take 25-50 mg by mouth every 6 (six) hours as needed for itching or allergies.     [provider]  doxycycline (VIBRA-TABS) 100 MG tablet Take 1 tablet (100 mg total) by mouth 2 (two) times daily. 01/17/16   Burchette, Elberta Fortis, MD  ferrous sulfate 324 (65 Fe) MG TBEC Take 324 mg by mouth daily. 04/16/16   [provider]  gabapentin (NEURONTIN) 300 MG capsule TAKE 1 TO 3 CAPSULES BY MOUTH EVERY DAY AS DIRECTED Patient taking differently: Take 300 mg by mouth 3 (three) times daily. 08/18/15   Roderick Pee, MD  ibuprofen (ADVIL) 400 MG tablet Take 1 tablet (400 mg total) by mouth every 6 (six) hours as needed for up to 20 doses. 03/12/20   Fawze, Mina A, PA-C  Multiple Vitamins-Minerals (PRESERVISION AREDS 2) CAPS Take 1 capsule by mouth 2 (two) times daily.     [provider]  Omega-3 Fatty Acids (FISH OIL PO)  Take 1 capsule by mouth 2 (two) times daily.    [provider]  ondansetron (ZOFRAN ODT) 4 MG disintegrating tablet 4mg  ODT q4 hours prn nausea/vomit 04/20/16   Melene Plan, DO  oxyCODONE (ROXICODONE) 5 MG immediate release tablet Take 0.5 tablets (2.5 mg total) by mouth every 4 (four) hours as needed for severe pain. 04/20/16   Melene Plan, DO  Polyethyl Glycol-Propyl Glycol (SYSTANE OP) Apply 1 drop to eye 3 (three) times daily as needed (for dry eyes).     [provider]  Probiotic Product (ACIDOPHILUS/GOAT MILK) CAPS Take 1 capsule by mouth every evening.    [provider]  rOPINIRole  (REQUIP) 0.25 MG tablet Take 1 tablet by mouth at  bedtime 03/19/16   Burchette, Elberta Fortis, MD  SYNTHROID 125 MCG tablet TAKE 1 TABLET BY MOUTH EVERY MORNING BEFORE BREAKFAST 10/01/16   Burchette, Elberta Fortis, MD  tacrolimus (PROTOPIC) 0.1 % ointment Apply 0.1 % topically daily as needed (for itchy eyelids).     [provider]  vitamin B-12 (CYANOCOBALAMIN) 1000 MCG tablet Take 1,000 mcg by mouth daily.    [provider]      Allergies    Cephalosporins, Codeine sulfate, Hydrocodone, Percocet [oxycodone-acetaminophen], Tramadol, and Penicillins    Review of Systems   Review of Systems  Physical Exam Updated Vital Signs BP (!) 127/59 (BP Location: Left Arm)   Pulse 60   Temp 98 F (36.7 C) (Oral)   Resp 18   Ht 5\' 3"  (1.6 m)   Wt 59.6 kg   SpO2 96%   BMI 23.28 kg/m  Physical Exam Vitals and nursing note reviewed.  Constitutional:      General: She is not in acute distress.    Appearance: She is well-developed.  HENT:     Head: Normocephalic and atraumatic.     Right Ear: External ear normal.     Left Ear: External ear normal.     Nose: Nose normal.  Eyes:     Extraocular Movements: Extraocular movements intact.     Conjunctiva/sclera: Conjunctivae normal.     Pupils: Pupils are equal, round, and reactive to light.  Pulmonary:     Effort: Pulmonary effort is normal. No respiratory distress.  Musculoskeletal:     Cervical back: Normal range of motion and neck supple.     Right lower leg: No edema.     Left lower leg: No edema.     Comments: No tenderness to palpation of left hip.  No effusion noted.  Full range of motion of the left hip.  No warmth or erythema  Motor: Muscle bulk and tone are normal. Strength is 5/5 in hip flexion, knee flexion and extension, ankle dorsiflexion and plantar flexion bilaterally. Full strength of great toe dorsiflexion bilaterally.  Sensory: Intact sensation to light touch in L2 though S1 dermatomes bilaterally.   DP pulses 2+  bilaterally  Skin:    General: Skin is warm and dry.  Neurological:     Mental Status: She is alert. Mental status is at baseline.  Psychiatric:        Mood and Affect: Mood normal.     ED Results / Procedures / Treatments   Labs (all labs ordered are listed, but only abnormal results are displayed) Labs Reviewed - No data to display  EKG None  Radiology DG Lumbar Spine Complete Result Date: 12/22/2023 CLINICAL DATA:  Pain extending into the left lower extremity. EXAM: LUMBAR SPINE - COMPLETE 4+ VIEW  COMPARISON:  CT lumbar spine 08/01/2023. Lumbar spine radiographs 05/13/2023. FINDINGS: 5 non rib-bearing lumbar type vertebral bodies are present. Slight degenerative anterolisthesis is again noted at L5-S1. Chronic loss of disc height and vacuum phenomena are present at each level in the lumbar spine. Levoconvex curvature is centered at T12. Rightward curvature is present at L4-5. Asymmetric left-sided facet degenerative change and foraminal stenosis is again noted at L5-S1. No acute fractures are present. Atherosclerotic changes are present in the aorta and branch vessels. Spinal augmentation is noted at T8, T9, T10, T11 and T12. IMPRESSION: 1. No acute abnormality or significant interval change. 2. Multilevel degenerative disc disease and facet degenerative change. 3. Asymmetric left-sided facet degenerative change and foraminal stenosis at L5-S1. 4. Aortic atherosclerosis. Electronically Signed   By: Marin Roberts M.D.   On: 12/22/2023 13:21   DG Hip Unilat W or Wo Pelvis 2-3 Views Left Result Date: 12/22/2023 CLINICAL DATA:  Chronic left hip pain without known injury. EXAM: DG HIP (WITH OR WITHOUT PELVIS) 2-3V LEFT COMPARISON:  Mar 12, 2020. FINDINGS: Status post bilateral total hip arthroplasties. No acute fracture or dislocation is noted. IMPRESSION: No acute abnormality seen. Electronically Signed   By: Lupita Raider M.D.   On: 12/22/2023 13:19    Procedures Procedures     Medications Ordered in ED Medications  lidocaine (LIDODERM) 5 % 1 patch (1 patch Transdermal Patch Applied 12/22/23 1434)  acetaminophen (TYLENOL) tablet 1,000 mg (1,000 mg Oral Given 12/22/23 1242)  HYDROmorphone (DILAUDID) injection 0.5 mg (0.5 mg Subcutaneous Given 12/22/23 1433)  ketorolac (TORADOL) 15 MG/ML injection 15 mg (15 mg Intramuscular Given 12/22/23 1431)    ED Course/ Medical Decision Making/ A&P                                 Medical Decision Making Amount and/or Complexity of Data Reviewed Radiology: ordered.  Risk OTC drugs. Prescription drug management.   DANETRA GLOCK is a 89 y.o. female with comorbidities that complicate the patient evaluation including chronic back pain who presents to the emergency department with left lower extremity pain.   Initial Ddx:  Lumbar radiculopathy, spinal cord compression, pathologic fracture, spinal epidural abscess, spinal epidural hematoma  MDM:  Feel the patient likely has lumbar radiculopathy given their symptoms.  No signs or symptoms of cord compression such as bowel or bladder incontinence or numbness or weakness that would warrant MRI.  Is at risk for pathologic fracture so will obtain x-ray at this time.  No risk factors for spinal epidural abscess or spinal epidural hematoma either.  Plan:  Lumbar spine x-ray, hip x-ray Tylenol Lidocaine patch  ED Summary/Re-evaluation:  X-rays without acute findings.  Patient still had difficult to control pain so she was given IM Toradol and subcu Dilaudid.  Was able to walk around without significant discomfort afterwards.  Patient and daughter do not want her to go home on any narcotics.  Did not want any muscle relaxers either.  Will have her use topical pain medication and follow-up with her spine doctor about another injection.  This patient presents to the ED for concern of complaints listed in HPI, this involves an extensive number of treatment options, and is a complaint  that carries with it a high risk of complications and morbidity. Disposition including potential need for admission considered.   Dispo: DC Home. Return precautions discussed including, but not limited to, those listed in the AVS.  Allowed pt time to ask questions which were answered fully prior to dc.  Additional history obtained from daughter Records reviewed Outpatient Clinic Notes I independently reviewed the following imaging with scope of interpretation limited to determining acute life threatening conditions related to emergency care: Pelvis x-ray and agree with the radiologist interpretation with the following exceptions: none I have reviewed the patients home medications and made adjustments as needed Social Determinants of health:  Elderly   Final Clinical Impression(s) / ED Diagnoses Final diagnoses:  Lumbar radiculopathy    Rx / DC Orders ED Discharge Orders     None         Rondel Baton, MD 12/22/23 2010

## 2023-12-24 ENCOUNTER — Emergency Department (HOSPITAL_COMMUNITY)

## 2023-12-24 ENCOUNTER — Emergency Department (HOSPITAL_COMMUNITY)
Admission: EM | Admit: 2023-12-24 | Discharge: 2023-12-24 | Disposition: A | Discharge status: Home / Self Care | Attending: Emergency Medicine | Admitting: Emergency Medicine

## 2023-12-24 ENCOUNTER — Encounter (HOSPITAL_COMMUNITY): Payer: Self-pay

## 2023-12-24 ENCOUNTER — Other Ambulatory Visit: Payer: Self-pay

## 2023-12-24 DIAGNOSIS — I10 Essential (primary) hypertension: Secondary | ICD-10-CM | POA: Diagnosis not present

## 2023-12-24 DIAGNOSIS — M48061 Spinal stenosis, lumbar region without neurogenic claudication: Secondary | ICD-10-CM | POA: Insufficient documentation

## 2023-12-24 DIAGNOSIS — E039 Hypothyroidism, unspecified: Secondary | ICD-10-CM | POA: Diagnosis not present

## 2023-12-24 DIAGNOSIS — E871 Hypo-osmolality and hyponatremia: Secondary | ICD-10-CM | POA: Diagnosis not present

## 2023-12-24 DIAGNOSIS — M5442 Lumbago with sciatica, left side: Secondary | ICD-10-CM | POA: Diagnosis not present

## 2023-12-24 DIAGNOSIS — N3000 Acute cystitis without hematuria: Secondary | ICD-10-CM | POA: Insufficient documentation

## 2023-12-24 DIAGNOSIS — Z79899 Other long term (current) drug therapy: Secondary | ICD-10-CM | POA: Diagnosis not present

## 2023-12-24 DIAGNOSIS — M25552 Pain in left hip: Secondary | ICD-10-CM | POA: Diagnosis present

## 2023-12-24 LAB — BASIC METABOLIC PANEL WITH GFR
Anion gap: 10 (ref 5–15)
BUN: 20 mg/dL (ref 8–23)
CO2: 23 mmol/L (ref 22–32)
Calcium: 8.6 mg/dL — ABNORMAL LOW (ref 8.9–10.3)
Chloride: 99 mmol/L (ref 98–111)
Creatinine, Ser: 0.51 mg/dL (ref 0.44–1.00)
GFR, Estimated: >60 mL/min
Glucose, Bld: 96 mg/dL (ref 70–99)
Potassium: 3.5 mmol/L (ref 3.5–5.1)
Sodium: 132 mmol/L — ABNORMAL LOW (ref 135–145)

## 2023-12-24 LAB — URINALYSIS, W/ REFLEX TO CULTURE (INFECTION SUSPECTED)
Bilirubin Urine: NEGATIVE
Glucose, UA: NEGATIVE mg/dL
Hgb urine dipstick: NEGATIVE
Ketones, ur: NEGATIVE mg/dL
Nitrite: NEGATIVE
Protein, ur: NEGATIVE mg/dL
Specific Gravity, Urine: 1.021 (ref 1.005–1.030)
pH: 5 (ref 5.0–8.0)

## 2023-12-24 LAB — CBC WITH DIFFERENTIAL/PLATELET
Abs Immature Granulocytes: 0.01 10*3/uL (ref 0.00–0.07)
Basophils Absolute: 0.1 10*3/uL (ref 0.0–0.1)
Basophils Relative: 1 %
Eosinophils Absolute: 0.2 10*3/uL (ref 0.0–0.5)
Eosinophils Relative: 3 %
HCT: 38.5 % (ref 36.0–46.0)
Hemoglobin: 11.6 g/dL — ABNORMAL LOW (ref 12.0–15.0)
Immature Granulocytes: 0 %
Lymphocytes Relative: 12 %
Lymphs Abs: 0.8 10*3/uL (ref 0.7–4.0)
MCH: 26.6 pg (ref 26.0–34.0)
MCHC: 30.1 g/dL (ref 30.0–36.0)
MCV: 88.3 fL (ref 80.0–100.0)
Monocytes Absolute: 0.7 10*3/uL (ref 0.1–1.0)
Monocytes Relative: 11 %
Neutro Abs: 4.8 10*3/uL (ref 1.7–7.7)
Neutrophils Relative %: 73 %
Platelets: 399 10*3/uL (ref 150–400)
RBC: 4.36 MIL/uL (ref 3.87–5.11)
RDW: 14.8 % (ref 11.5–15.5)
WBC: 6.5 10*3/uL (ref 4.0–10.5)
nRBC: 0 % (ref 0.0–0.2)

## 2023-12-24 MED ORDER — HYDROMORPHONE HCL 1 MG/ML IJ SOLN
0.5000 mg | Freq: Once | INTRAMUSCULAR | Status: AC
  Administered 2023-12-24: 0.5 mg via INTRAVENOUS
  Filled 2023-12-24: qty 1

## 2023-12-24 MED ORDER — ONDANSETRON 4 MG PO TBDP: 4.0000 mg | ORAL_TABLET | Freq: Three times a day (TID) | ORAL | 0 refills | Status: DC | PRN

## 2023-12-24 MED ORDER — OXYCODONE-ACETAMINOPHEN 5-325 MG PO TABS
1.0000 | ORAL_TABLET | Freq: Four times a day (QID) | ORAL | 0 refills | Status: DC | PRN
Start: 2023-12-24 — End: 2024-04-28

## 2023-12-24 MED ORDER — CIPROFLOXACIN HCL 500 MG PO TABS
500.0000 mg | ORAL_TABLET | Freq: Once | ORAL | Status: AC
  Administered 2023-12-24: 500 mg via ORAL
  Filled 2023-12-24: qty 1

## 2023-12-24 MED ORDER — ONDANSETRON HCL 4 MG/2ML IJ SOLN
4.0000 mg | Freq: Once | INTRAMUSCULAR | Status: AC
  Administered 2023-12-24: 4 mg via INTRAVENOUS
  Filled 2023-12-24: qty 2

## 2023-12-24 MED ORDER — CIPROFLOXACIN HCL 500 MG PO TABS: 500.0000 mg | ORAL_TABLET | Freq: Two times a day (BID) | ORAL | 0 refills | Status: DC

## 2023-12-24 NOTE — ED Notes (Signed)
 Pt ambulates with walker. No difficult noted.

## 2023-12-24 NOTE — ED Triage Notes (Signed)
 Patient brought in by EMS due to left hip and leg pain. Was seen for the same on the 12/22/23. Denies any injuries or falls. States medications are not helping.

## 2023-12-24 NOTE — ED Provider Notes (Addendum)
 [place] EMERGENCY DEPARTMENT AT [facility] Provider Note   CSN: [id number] Arrival date & time: 12/24/23  1028     History  Chief Complaint  Patient presents with   Hip Pain    Carolyn Henderson is a 89 y.o. female.  Pt is a 89 yo female with pmhx significant for chronic back pain due to spinal stenosis, HTN, HLD, hypothyroidism, GERD, and arthritis.  Pt has been having worsening back pain with pain radiating down left leg and hip.  Pt is scheduled to see Dr. Danielle Dess on Monday, 3/17 for a shot, but can't wait that long.  Pt was seen here yesterday and given pain meds, but did not want anything for home as she was worried it would cause too much nausea.  However, she did not sleep last night and is now willing to try it.       Home Medications Prior to Admission medications   Medication Sig Start Date End Date Taking? Authorizing Provider  ciprofloxacin (CIPRO) 500 MG tablet Take 1 tablet (500 mg total) by mouth 2 (two) times daily. 12/24/23  Yes Jacalyn Lefevre, MD  ondansetron (ZOFRAN-ODT) 4 MG disintegrating tablet Take 1 tablet (4 mg total) by mouth every 8 (eight) hours as needed. 12/24/23  Yes Jacalyn Lefevre, MD  oxyCODONE-acetaminophen (PERCOCET/ROXICET) 5-325 MG tablet Take 1 tablet by mouth every 6 (six) hours as needed for severe pain (pain score 7-10). 12/24/23  Yes Jacalyn Lefevre, MD  acetaminophen (TYLENOL) 325 MG tablet Take 2 tablets (650 mg total) by mouth every 6 (six) hours as needed for mild pain (or Fever >/= 101). 08/05/14   Lanney Gins, PA-C  amLODipine (NORVASC) 2.5 MG tablet Take 1 tablet (2.5 mg total) by mouth daily. 10/21/15   Burchette, Elberta Fortis, MD  benzonatate (TESSALON) 100 MG capsule Take 1 capsule (100 mg total) by mouth 3 (three) times daily as needed for cough. 01/17/16   Burchette, Elberta Fortis, MD  Calcium Carbonate-Vit D-Min (CALCIUM 1200 PO) Take by mouth.    [provider]  Carboxymethylcell-Hypromellose (GENTEAL OP) Apply 1 drop  to eye at bedtime.    [provider]  cetirizine (ZYRTEC) 10 MG tablet Take 10 mg by mouth at bedtime.     [provider]  cholecalciferol (VITAMIN D) 1000 UNITS tablet Take 1,000 Units by mouth daily.    [provider]  clobetasol ointment (TEMOVATE) 0.05 % Apply 1 application topically 2 (two) times daily as needed (for eczema).    [provider]  clotrimazole-betamethasone (LOTRISONE) cream Apply 1 application topically 2 (two) times daily. Patient taking differently: Apply 1 application  topically 2 (two) times daily as needed (itching, eczema). 10/27/15   Burchette, Elberta Fortis, MD  diphenhydrAMINE (BENADRYL) 25 MG tablet Take 25-50 mg by mouth every 6 (six) hours as needed for itching or allergies.     [provider]  doxycycline (VIBRA-TABS) 100 MG tablet Take 1 tablet (100 mg total) by mouth 2 (two) times daily. 01/17/16   Burchette, Elberta Fortis, MD  ferrous sulfate 324 (65 Fe) MG TBEC Take 324 mg by mouth daily. 04/16/16   [provider]  gabapentin (NEURONTIN) 300 MG capsule TAKE 1 TO 3 CAPSULES BY MOUTH EVERY DAY AS DIRECTED Patient taking differently: Take 300 mg by mouth 3 (three) times daily. 08/18/15   Roderick Pee, MD  ibuprofen (ADVIL) 400 MG tablet Take 1 tablet (400 mg total) by mouth every 6 (six) hours as needed for up to  20 doses. 03/12/20   Fawze, Mina A, PA-C  Multiple Vitamins-Minerals (PRESERVISION AREDS 2) CAPS Take 1 capsule by mouth 2 (two) times daily.     [provider]  Omega-3 Fatty Acids (FISH OIL PO) Take 1 capsule by mouth 2 (two) times daily.    [provider]  ondansetron (ZOFRAN ODT) 4 MG disintegrating tablet 4mg  ODT q4 hours prn nausea/vomit 04/20/16   Melene Plan, DO  oxyCODONE (ROXICODONE) 5 MG immediate release tablet Take 0.5 tablets (2.5 mg total) by mouth every 4 (four) hours as needed for severe pain. 04/20/16   Melene Plan, DO  Polyethyl Glycol-Propyl Glycol (SYSTANE OP) Apply 1 drop to eye  3 (three) times daily as needed (for dry eyes).     [provider]  Probiotic Product (ACIDOPHILUS/GOAT MILK) CAPS Take 1 capsule by mouth every evening.    [provider]  rOPINIRole (REQUIP) 0.25 MG tablet Take 1 tablet by mouth at  bedtime 03/19/16   Burchette, Elberta Fortis, MD  SYNTHROID 125 MCG tablet TAKE 1 TABLET BY MOUTH EVERY MORNING BEFORE BREAKFAST 10/01/16   Burchette, Elberta Fortis, MD  tacrolimus (PROTOPIC) 0.1 % ointment Apply 0.1 % topically daily as needed (for itchy eyelids).     [provider]  vitamin B-12 (CYANOCOBALAMIN) 1000 MCG tablet Take 1,000 mcg by mouth daily.    [provider]      Allergies    Cephalosporins, Codeine sulfate, Hydrocodone, Percocet [oxycodone-acetaminophen], Tramadol, and Penicillins    Review of Systems   Review of Systems  Musculoskeletal:  Positive for back pain.  All other systems reviewed and are negative.   Physical Exam Updated Vital Signs BP (!) 185/83   Pulse 70   Temp 98.5 F (36.9 C) (Oral)   Resp 18   Ht 5\' 3"  (1.6 m)   Wt 59.6 kg   SpO2 99%   BMI 23.28 kg/m  Physical Exam Vitals and nursing note reviewed.  Constitutional:      Appearance: Normal appearance.  HENT:     Head: Normocephalic and atraumatic.     Right Ear: External ear normal.     Left Ear: External ear normal.     Nose: Nose normal.     Mouth/Throat:     Mouth: Mucous membranes are moist.     Pharynx: Oropharynx is clear.  Eyes:     Extraocular Movements: Extraocular movements intact.     Conjunctiva/sclera: Conjunctivae normal.     Pupils: Pupils are equal, round, and reactive to light.  Cardiovascular:     Rate and Rhythm: Normal rate and regular rhythm.     Pulses: Normal pulses.     Heart sounds: Normal heart sounds.  Pulmonary:     Effort: Pulmonary effort is normal.     Breath sounds: Normal breath sounds.  Abdominal:     General: Abdomen is flat. Bowel sounds are normal.     Palpations: Abdomen is soft.   Musculoskeletal:     Cervical back: Normal range of motion and neck supple.     Lumbar back: Positive left straight leg raise test.  Skin:    General: Skin is warm.     Capillary Refill: Capillary refill takes less than 2 seconds.  Neurological:     General: No focal deficit present.     Mental Status: She is alert and oriented to person, place, and time.  Psychiatric:        Mood and Affect: Mood normal.  Behavior: Behavior normal.     ED Results / Procedures / Treatments   Labs (all labs ordered are listed, but only abnormal results are displayed) Labs Reviewed  CBC WITH DIFFERENTIAL/PLATELET - Abnormal; Notable for the following components:      Result Value   Hemoglobin 11.6 (*)    All other components within normal limits  BASIC METABOLIC PANEL - Abnormal; Notable for the following components:   Sodium 132 (*)    Calcium 8.6 (*)    All other components within normal limits  URINALYSIS, W/ REFLEX TO CULTURE (INFECTION SUSPECTED) - Abnormal; Notable for the following components:   APPearance HAZY (*)    Leukocytes,Ua MODERATE (*)    Bacteria, UA MANY (*)    All other components within normal limits  URINE CULTURE    EKG None  Radiology CT Lumbar Spine Wo Contrast Result Date: 12/24/2023 CLINICAL DATA:  89 year old female with back pain radiating to the left hip and leg. EXAM: CT LUMBAR SPINE WITHOUT CONTRAST TECHNIQUE: Multidetector CT imaging of the lumbar spine was performed without intravenous contrast administration. Multiplanar CT image reconstructions were also generated. RADIATION DOSE REDUCTION: This exam was performed according to the departmental dose-optimization program which includes automated exposure control, adjustment of the mA and/or kV according to patient size and/or use of iterative reconstruction technique. COMPARISON:  lumbar spine CT 12/01/2022. FINDINGS: Segmentation: Normal, the same numbering system used last year. Alignment: Stable.  Maintained lumbar lordosis superimposed on mild thoracolumbar scoliosis and chronic grade 1 L5-S1 spondylolisthesis. Vertebrae: Previously augmented T11 and T12 compression fractures. Stable lumbar vertebral height, osteopenia. Subtle chronic S2 central sacral compression fracture with stable mild angulation. Chronic left posterior 12th rib fracture is stable. Visible sacrum and SI joints appear stable. Partially visible bilateral hip arthroplasty. No acute osseous abnormality identified. Paraspinal and other soft tissues: Aortoiliac calcified atherosclerosis. Stable noncontrast visible abdominal viscera. Diverticulosis throughout the large bowel in the visible pelvis. Paraspinal soft tissues are stable, negative. Disc levels: Advanced diffuse lumbar spine degeneration is stable from that detailed on the October CT (please see that report) . Specifically, multifactorial lumbar spinal stenosis at L1-L2, L2-L3, L3-L4, L4-L5 appears stable by CT. And lumbar neural foraminal stenosis appears stable, which notably is moderate to severe at the left L2 and bilateral L5 nerve levels. IMPRESSION: 1. No acute osseous abnormality and stable CT appearance of the Lumbar Spine since October. See details above. 2. Aortic Atherosclerosis (ICD10-I70.0). Large bowel diverticulosis. Electronically Signed   By: Odessa Fleming M.D.   On: 12/24/2023 13:13    Procedures Procedures    Medications Ordered in ED Medications  ciprofloxacin (CIPRO) tablet 500 mg (has no administration in time range)  HYDROmorphone (DILAUDID) injection 0.5 mg (0.5 mg Intravenous Given 12/24/23 1200)  ondansetron (ZOFRAN) injection 4 mg (4 mg Intravenous Given 12/24/23 1200)  HYDROmorphone (DILAUDID) injection 0.5 mg (0.5 mg Intravenous Given 12/24/23 1334)    ED Course/ Medical Decision Making/ A&P                                 Medical Decision Making Amount and/or Complexity of Data Reviewed Labs: ordered. Radiology:  ordered.  Risk Prescription drug management.   This patient presents to the ED for concern of back pain, this involves an extensive number of treatment options, and is a complaint that carries with it a high risk of complications and morbidity.  The differential diagnosis includes sciatica, AAA, uti  Co morbidities that complicate the patient evaluation  chronic back pain due to spinal stenosis, HTN, HLD< hypothyroidism, GERD, and arthritis   Additional history obtained:  Additional history obtained from epic chart review External records from outside source obtained and reviewed including family   Lab Tests:  I Ordered, and personally interpreted labs.  The pertinent results include:  cbc nl other than hgb 11.6, bmp nl other than mild hyponatremia; UA + for UTI   Imaging Studies ordered:  I ordered imaging studies including ct lumbar  I independently visualized and interpreted imaging which showed   No acute osseous abnormality and stable CT appearance of the  Lumbar Spine since October. See details above.  2. Aortic Atherosclerosis (ICD10-I70.0). Large bowel diverticulosis.   I agree with the radiologist interpretation  Medicines ordered and prescription drug management:  I ordered medication including dilaudid  for pain  Reevaluation of the patient after these medicines showed that the patient improved I have reviewed the patients home medicines and have made adjustments as needed   Test Considered:  ct   Critical Interventions:  Pain control   Problem List / ED Course:  Spinal stenosis:  pain has improved.  She is feeling much better and is able to ambulate.  Pt is willing to try percocet with zofran at home.  She knows to return if worse.  F/u with Dr. Danielle Dess as scheduled. UTI:  pt said she can only tolerate cipro, so she is d/c with that.   Reevaluation:  After the interventions noted above, I reevaluated the patient and found that they have  :improved   Social Determinants of Health:  Lives at home   Dispostion:  After consideration of the diagnostic results and the patients response to treatment, I feel that the patent would benefit from discharge with outpatient f/u.          Final Clinical Impression(s) / ED Diagnoses Final diagnoses:  Spinal stenosis of lumbar region without neurogenic claudication  Acute left-sided low back pain with left-sided sciatica  Acute cystitis without hematuria    Rx / DC Orders ED Discharge Orders          Ordered    oxyCODONE-acetaminophen (PERCOCET/ROXICET) 5-325 MG tablet  Every 6 hours PRN        12/24/23 1345    ondansetron (ZOFRAN-ODT) 4 MG disintegrating tablet  Every 8 hours PRN        12/24/23 1345    ciprofloxacin (CIPRO) 500 MG tablet  2 times daily        12/24/23 1406              Jacalyn Lefevre, MD 12/24/23 1349    Jacalyn Lefevre, MD 12/24/23 1408

## 2023-12-25 ENCOUNTER — Other Ambulatory Visit: Payer: Self-pay

## 2023-12-25 ENCOUNTER — Emergency Department (HOSPITAL_COMMUNITY)
Admission: EM | Admit: 2023-12-25 | Discharge: 2023-12-26 | Disposition: A | Discharge status: Home / Self Care | Attending: Emergency Medicine | Admitting: Emergency Medicine

## 2023-12-25 ENCOUNTER — Emergency Department (HOSPITAL_COMMUNITY)

## 2023-12-25 ENCOUNTER — Encounter (HOSPITAL_COMMUNITY): Payer: Self-pay | Admitting: Emergency Medicine

## 2023-12-25 DIAGNOSIS — J449 Chronic obstructive pulmonary disease, unspecified: Secondary | ICD-10-CM | POA: Insufficient documentation

## 2023-12-25 DIAGNOSIS — Z79899 Other long term (current) drug therapy: Secondary | ICD-10-CM | POA: Diagnosis not present

## 2023-12-25 DIAGNOSIS — W19XXXA Unspecified fall, initial encounter: Secondary | ICD-10-CM | POA: Diagnosis not present

## 2023-12-25 DIAGNOSIS — R062 Wheezing: Secondary | ICD-10-CM | POA: Insufficient documentation

## 2023-12-25 DIAGNOSIS — I10 Essential (primary) hypertension: Secondary | ICD-10-CM | POA: Diagnosis not present

## 2023-12-25 DIAGNOSIS — S0083XA Contusion of other part of head, initial encounter: Secondary | ICD-10-CM | POA: Insufficient documentation

## 2023-12-25 DIAGNOSIS — S0990XA Unspecified injury of head, initial encounter: Secondary | ICD-10-CM | POA: Diagnosis present

## 2023-12-25 MED ORDER — IPRATROPIUM-ALBUTEROL 0.5-2.5 (3) MG/3ML IN SOLN
3.0000 mL | Freq: Once | RESPIRATORY_TRACT | Status: AC
  Administered 2023-12-25: 3 mL via RESPIRATORY_TRACT
  Filled 2023-12-25: qty 3

## 2023-12-25 NOTE — ED Triage Notes (Signed)
 Pt BIBA for mechanical fall while changing clothes, pt fell and hit forehead then landed on L side. Slight swelling to L knee noted. No LOC and no thinners. Pt is A&O x4.

## 2023-12-25 NOTE — ED Provider Notes (Signed)
 [place] EMERGENCY DEPARTMENT AT [facility] Provider Note   CSN: [id number] Arrival date & time: 12/25/23  2125     History  Chief Complaint  Patient presents with   Marletta Lor    Carolyn Henderson is a 89 y.o. female with medical history significant for GERD, diverticulosis, hypertension, spinal stenosis.  Patient presents to ED for evaluation of fall.  States that she tangled her feet in her pajama pants tonight causing her to fall forward onto her face.  Patient has been seen 2 times in the last 3 days for back pain.  At the last visit, she was provided with narcotic pain medication.  She denies loss of consciousness.  She denies blood thinners.  She was here with ecchymosis, bruising to her forehead.  She reports her back pain is currently at baseline.  Denies any red flag symptoms of low back pain.  Is set to see Dr. Danielle Dess for steroid injections on 3/17.  Patient also arrives on oxygen.  Unsure as to why.  She denies home oxygen use and this is confirmed by her daughter at the bedside.  Patient has COPD, has not done breathing treatments today.  Has slight wheezing on exam.   Fall Pertinent negatives include no chest pain and no shortness of breath.       Home Medications Prior to Admission medications   Medication Sig Start Date End Date Taking? Authorizing Provider  acetaminophen (TYLENOL) 325 MG tablet Take 2 tablets (650 mg total) by mouth every 6 (six) hours as needed for mild pain (or Fever >/= 101). 08/05/14   Lanney Gins, PA-C  amLODipine (NORVASC) 2.5 MG tablet Take 1 tablet (2.5 mg total) by mouth daily. 10/21/15   Burchette, Elberta Fortis, MD  benzonatate (TESSALON) 100 MG capsule Take 1 capsule (100 mg total) by mouth 3 (three) times daily as needed for cough. 01/17/16   Burchette, Elberta Fortis, MD  Calcium Carbonate-Vit D-Min (CALCIUM 1200 PO) Take by mouth.    [provider]  Carboxymethylcell-Hypromellose (GENTEAL OP) Apply 1 drop to eye at bedtime.     [provider]  cetirizine (ZYRTEC) 10 MG tablet Take 10 mg by mouth at bedtime.     [provider]  cholecalciferol (VITAMIN D) 1000 UNITS tablet Take 1,000 Units by mouth daily.    [provider]  ciprofloxacin (CIPRO) 500 MG tablet Take 1 tablet (500 mg total) by mouth 2 (two) times daily. 12/24/23   Jacalyn Lefevre, MD  clobetasol ointment (TEMOVATE) 0.05 % Apply 1 application topically 2 (two) times daily as needed (for eczema).    [provider]  clotrimazole-betamethasone (LOTRISONE) cream Apply 1 application topically 2 (two) times daily. Patient taking differently: Apply 1 application  topically 2 (two) times daily as needed (itching, eczema). 10/27/15   Burchette, Elberta Fortis, MD  diphenhydrAMINE (BENADRYL) 25 MG tablet Take 25-50 mg by mouth every 6 (six) hours as needed for itching or allergies.     [provider]  doxycycline (VIBRA-TABS) 100 MG tablet Take 1 tablet (100 mg total) by mouth 2 (two) times daily. 01/17/16   Burchette, Elberta Fortis, MD  ferrous sulfate 324 (65 Fe) MG TBEC Take 324 mg by mouth daily. 04/16/16   [provider]  gabapentin (NEURONTIN) 300 MG capsule TAKE 1 TO 3 CAPSULES BY MOUTH EVERY DAY AS DIRECTED Patient taking differently: Take 300 mg by mouth 3 (three) times daily. 08/18/15   Roderick Pee, MD  ibuprofen (ADVIL) 400 MG  tablet Take 1 tablet (400 mg total) by mouth every 6 (six) hours as needed for up to 20 doses. 03/12/20   Fawze, Mina A, PA-C  Multiple Vitamins-Minerals (PRESERVISION AREDS 2) CAPS Take 1 capsule by mouth 2 (two) times daily.     [provider]  Omega-3 Fatty Acids (FISH OIL PO) Take 1 capsule by mouth 2 (two) times daily.    [provider]  ondansetron (ZOFRAN ODT) 4 MG disintegrating tablet 4mg  ODT q4 hours prn nausea/vomit 04/20/16   Melene Plan, DO  ondansetron (ZOFRAN-ODT) 4 MG disintegrating tablet Take 1 tablet (4 mg total) by mouth every 8 (eight) hours as needed.  12/24/23   Jacalyn Lefevre, MD  oxyCODONE (ROXICODONE) 5 MG immediate release tablet Take 0.5 tablets (2.5 mg total) by mouth every 4 (four) hours as needed for severe pain. 04/20/16   Melene Plan, DO  oxyCODONE-acetaminophen (PERCOCET/ROXICET) 5-325 MG tablet Take 1 tablet by mouth every 6 (six) hours as needed for severe pain (pain score 7-10). 12/24/23   Jacalyn Lefevre, MD  Polyethyl Glycol-Propyl Glycol (SYSTANE OP) Apply 1 drop to eye 3 (three) times daily as needed (for dry eyes).     [provider]  Probiotic Product (ACIDOPHILUS/GOAT MILK) CAPS Take 1 capsule by mouth every evening.    [provider]  rOPINIRole (REQUIP) 0.25 MG tablet Take 1 tablet by mouth at  bedtime 03/19/16   Burchette, Elberta Fortis, MD  SYNTHROID 125 MCG tablet TAKE 1 TABLET BY MOUTH EVERY MORNING BEFORE BREAKFAST 10/01/16   Burchette, Elberta Fortis, MD  tacrolimus (PROTOPIC) 0.1 % ointment Apply 0.1 % topically daily as needed (for itchy eyelids).     [provider]  vitamin B-12 (CYANOCOBALAMIN) 1000 MCG tablet Take 1,000 mcg by mouth daily.    [provider]      Allergies    Cephalosporins, Codeine sulfate, Hydrocodone, Percocet [oxycodone-acetaminophen], Tramadol, and Penicillins    Review of Systems   Review of Systems  Respiratory:  Negative for shortness of breath.   Cardiovascular:  Negative for chest pain.  All other systems reviewed and are negative.   Physical Exam Updated Vital Signs BP 121/66   Pulse 78   Temp 98.3 F (36.8 C) (Oral)   Resp 16   Ht 5\' 3"  (1.6 m)   Wt 59.6 kg   SpO2 100%   BMI 23.28 kg/m  Physical Exam Vitals and nursing note reviewed.  Constitutional:      General: She is not in acute distress.    Appearance: She is well-developed.  HENT:     Head: Normocephalic.   Eyes:     Conjunctiva/sclera: Conjunctivae normal.  Neck:     Comments: No cervical spinal tenderness Cardiovascular:     Rate and Rhythm: Normal rate and regular rhythm.      Heart sounds: No murmur heard. Pulmonary:     Effort: Pulmonary effort is normal. No respiratory distress.     Breath sounds: Wheezing present.  Abdominal:     Palpations: Abdomen is soft.     Tenderness: There is no abdominal tenderness.  Musculoskeletal:        General: No swelling.     Cervical back: Neck supple.  Skin:    General: Skin is warm and dry.     Capillary Refill: Capillary refill takes less than 2 seconds.  Neurological:     General: No focal deficit present.     Mental Status: She is alert.     GCS:  GCS eye subscore is 4. GCS verbal subscore is 5. GCS motor subscore is 6.     Cranial Nerves: Cranial nerves 2-12 are intact. No cranial nerve deficit.     Sensory: Sensation is intact. No sensory deficit.     Motor: Motor function is intact. No weakness.     Comments: EOMs intact nonpainful.  No focal deficits on exam.  Neurological examinations at baseline.  Psychiatric:        Mood and Affect: Mood normal.     ED Results / Procedures / Treatments   Labs (all labs ordered are listed, but only abnormal results are displayed) Labs Reviewed - No data to display  EKG None  Radiology CT Lumbar Spine Wo Contrast Result Date: 12/24/2023 CLINICAL DATA:  89 year old female with back pain radiating to the left hip and leg. EXAM: CT LUMBAR SPINE WITHOUT CONTRAST TECHNIQUE: Multidetector CT imaging of the lumbar spine was performed without intravenous contrast administration. Multiplanar CT image reconstructions were also generated. RADIATION DOSE REDUCTION: This exam was performed according to the departmental dose-optimization program which includes automated exposure control, adjustment of the mA and/or kV according to patient size and/or use of iterative reconstruction technique. COMPARISON:  lumbar spine CT 12/01/2022. FINDINGS: Segmentation: Normal, the same numbering system used last year. Alignment: Stable. Maintained lumbar lordosis superimposed on mild thoracolumbar  scoliosis and chronic grade 1 L5-S1 spondylolisthesis. Vertebrae: Previously augmented T11 and T12 compression fractures. Stable lumbar vertebral height, osteopenia. Subtle chronic S2 central sacral compression fracture with stable mild angulation. Chronic left posterior 12th rib fracture is stable. Visible sacrum and SI joints appear stable. Partially visible bilateral hip arthroplasty. No acute osseous abnormality identified. Paraspinal and other soft tissues: Aortoiliac calcified atherosclerosis. Stable noncontrast visible abdominal viscera. Diverticulosis throughout the large bowel in the visible pelvis. Paraspinal soft tissues are stable, negative. Disc levels: Advanced diffuse lumbar spine degeneration is stable from that detailed on the October CT (please see that report) . Specifically, multifactorial lumbar spinal stenosis at L1-L2, L2-L3, L3-L4, L4-L5 appears stable by CT. And lumbar neural foraminal stenosis appears stable, which notably is moderate to severe at the left L2 and bilateral L5 nerve levels. IMPRESSION: 1. No acute osseous abnormality and stable CT appearance of the Lumbar Spine since October. See details above. 2. Aortic Atherosclerosis (ICD10-I70.0). Large bowel diverticulosis. Electronically Signed   By: Odessa Fleming M.D.   On: 12/24/2023 13:13    Procedures Procedures   Medications Ordered in ED Medications  ipratropium-albuterol (DUONEB) 0.5-2.5 (3) MG/3ML nebulizer solution 3 mL (3 mLs Nebulization Given 12/25/23 2219)    ED Course/ Medical Decision Making/ A&P   Medical Decision Making Amount and/or Complexity of Data Reviewed Radiology: ordered.  Risk Prescription drug management.   89 year old female presents for evaluation.  Please see HPI for further details.  On examination patient is afebrile, nontachycardic.  Lung sounds have slight wheezing throughout, O2 saturation 93% room air.  Abdomen soft and compressible.  Neurological examinations at baseline without  focal neurodeficits.  Patient does have area of ecchymosis to forehead.  No cervical spinal tenderness.  Patient apparently has COPD and has not done breathing treatment today.  Will provide her with breathing treatment.  Will collect CT head, cervical spine.  After breathing treatment, patient wheezing subsided.  Patient imaging not yet resulted at this time.  Signed out to oncoming provider Sharilyn Sites, PA-C.  Final Clinical Impression(s) / ED Diagnoses Final diagnoses:  Fall, initial encounter    Rx / DC Orders ED  Discharge Orders     None         Clent Ridges 12/25/23 2355    Charlynne Pander, MD 12/26/23 [phone]

## 2023-12-26 LAB — URINE CULTURE

## 2023-12-26 NOTE — Discharge Instructions (Signed)
 CT's today were normal. Follow-up with your doctor. Return here for new concerns.

## 2023-12-27 ENCOUNTER — Telehealth (HOSPITAL_BASED_OUTPATIENT_CLINIC_OR_DEPARTMENT_OTHER): Payer: Self-pay

## 2023-12-27 NOTE — Telephone Encounter (Signed)
 Post ED Visit - Positive Culture Follow-up  Culture report reviewed by antimicrobial stewardship pharmacist: [facility] []  Enzo Bi, Pharm.D. [x]  Celedonio Miyamoto, [address].D., BCPS AQ-ID []  Garvin Fila, Pharm.D., BCPS []  Georgina Pillion, Pharm.D., BCPS []  [place], [place].D., BCPS, AAHIVP []  Estella Husk, Pharm.D., BCPS, AAHIVP []  Lysle Pearl, PharmD, BCPS []  Phillips Climes, PharmD, BCPS []  Agapito Games, PharmD, BCPS []  Verlan Friends, PharmD []  Mervyn Gay, PharmD, BCPS []  Vinnie Level, PharmD  [facility] []  Len Childs, PharmD []  Greer Pickerel, PharmD []  Adalberto Cole, PharmD []  Perlie Gold, Rph []  Lonell Face) Jean Rosenthal, PharmD []  Earl Many, PharmD []  Junita Push, PharmD []  Dorna Leitz, PharmD []  Terrilee Files, PharmD []  Lynann Beaver, PharmD []  Keturah Barre, PharmD []  Loralee Pacas, PharmD []  Bernadene Person, PharmD   Positive urine culture Treated with Ciprofloxacin, organism sensitive to the same and no further patient follow-up is required at this time.  Sandria Senter 12/27/2023, 11:08 AM

## 2024-04-25 ENCOUNTER — Other Ambulatory Visit: Payer: Self-pay

## 2024-04-25 ENCOUNTER — Inpatient Hospital Stay (HOSPITAL_COMMUNITY)
Admission: EM | Admit: 2024-04-25 | Discharge: 2024-04-28 | DRG: 690 | Disposition: A | Discharge status: Home Health | Attending: Internal Medicine | Admitting: Internal Medicine

## 2024-04-25 ENCOUNTER — Encounter (HOSPITAL_COMMUNITY): Payer: Self-pay

## 2024-04-25 DIAGNOSIS — Z79899 Other long term (current) drug therapy: Secondary | ICD-10-CM | POA: Diagnosis not present

## 2024-04-25 DIAGNOSIS — Z792 Long term (current) use of antibiotics: Secondary | ICD-10-CM | POA: Diagnosis not present

## 2024-04-25 DIAGNOSIS — Z96653 Presence of artificial knee joint, bilateral: Secondary | ICD-10-CM | POA: Diagnosis present

## 2024-04-25 DIAGNOSIS — Z881 Allergy status to other antibiotic agents status: Secondary | ICD-10-CM | POA: Diagnosis not present

## 2024-04-25 DIAGNOSIS — R718 Other abnormality of red blood cells: Secondary | ICD-10-CM | POA: Diagnosis present

## 2024-04-25 DIAGNOSIS — I10 Essential (primary) hypertension: Secondary | ICD-10-CM | POA: Diagnosis present

## 2024-04-25 DIAGNOSIS — Z87891 Personal history of nicotine dependence: Secondary | ICD-10-CM

## 2024-04-25 DIAGNOSIS — Z8249 Family history of ischemic heart disease and other diseases of the circulatory system: Secondary | ICD-10-CM | POA: Diagnosis not present

## 2024-04-25 DIAGNOSIS — M8580 Other specified disorders of bone density and structure, unspecified site: Secondary | ICD-10-CM | POA: Diagnosis present

## 2024-04-25 DIAGNOSIS — B961 Klebsiella pneumoniae [K. pneumoniae] as the cause of diseases classified elsewhere: Secondary | ICD-10-CM | POA: Diagnosis present

## 2024-04-25 DIAGNOSIS — B9620 Unspecified Escherichia coli [E. coli] as the cause of diseases classified elsewhere: Secondary | ICD-10-CM | POA: Diagnosis present

## 2024-04-25 DIAGNOSIS — E538 Deficiency of other specified B group vitamins: Secondary | ICD-10-CM | POA: Diagnosis present

## 2024-04-25 DIAGNOSIS — N390 Urinary tract infection, site not specified: Secondary | ICD-10-CM | POA: Diagnosis present

## 2024-04-25 DIAGNOSIS — N3000 Acute cystitis without hematuria: Secondary | ICD-10-CM

## 2024-04-25 DIAGNOSIS — Z79890 Hormone replacement therapy (postmenopausal): Secondary | ICD-10-CM | POA: Diagnosis not present

## 2024-04-25 DIAGNOSIS — E785 Hyperlipidemia, unspecified: Secondary | ICD-10-CM | POA: Diagnosis present

## 2024-04-25 DIAGNOSIS — D500 Iron deficiency anemia secondary to blood loss (chronic): Secondary | ICD-10-CM | POA: Diagnosis present

## 2024-04-25 DIAGNOSIS — Z90710 Acquired absence of both cervix and uterus: Secondary | ICD-10-CM

## 2024-04-25 DIAGNOSIS — Z96643 Presence of artificial hip joint, bilateral: Secondary | ICD-10-CM | POA: Diagnosis present

## 2024-04-25 DIAGNOSIS — R54 Age-related physical debility: Secondary | ICD-10-CM | POA: Diagnosis present

## 2024-04-25 DIAGNOSIS — E039 Hypothyroidism, unspecified: Secondary | ICD-10-CM | POA: Diagnosis present

## 2024-04-25 DIAGNOSIS — Z8619 Personal history of other infectious and parasitic diseases: Secondary | ICD-10-CM

## 2024-04-25 DIAGNOSIS — Z880 Allergy status to penicillin: Secondary | ICD-10-CM | POA: Diagnosis not present

## 2024-04-25 DIAGNOSIS — B0229 Other postherpetic nervous system involvement: Secondary | ICD-10-CM | POA: Diagnosis present

## 2024-04-25 DIAGNOSIS — Z885 Allergy status to narcotic agent status: Secondary | ICD-10-CM | POA: Diagnosis not present

## 2024-04-25 DIAGNOSIS — I5189 Other ill-defined heart diseases: Secondary | ICD-10-CM

## 2024-04-25 HISTORY — DX: Iron deficiency anemia secondary to blood loss (chronic): D50.0

## 2024-04-25 LAB — URINALYSIS, ROUTINE W REFLEX MICROSCOPIC
Bilirubin Urine: NEGATIVE
Glucose, UA: NEGATIVE mg/dL
Hgb urine dipstick: NEGATIVE
Ketones, ur: NEGATIVE mg/dL
Nitrite: POSITIVE — AB
Protein, ur: NEGATIVE mg/dL
Specific Gravity, Urine: 1.006 (ref 1.005–1.030)
pH: 6 (ref 5.0–8.0)

## 2024-04-25 LAB — CBC
HCT: 34.4 % — ABNORMAL LOW (ref 36.0–46.0)
Hemoglobin: 10.1 g/dL — ABNORMAL LOW (ref 12.0–15.0)
MCH: 23 pg — ABNORMAL LOW (ref 26.0–34.0)
MCHC: 29.4 g/dL — ABNORMAL LOW (ref 30.0–36.0)
MCV: 78.2 fL — ABNORMAL LOW (ref 80.0–100.0)
Platelets: 490 K/uL — ABNORMAL HIGH (ref 150–400)
RBC: 4.4 MIL/uL (ref 3.87–5.11)
RDW: 21.4 % — ABNORMAL HIGH (ref 11.5–15.5)
WBC: 7.6 K/uL (ref 4.0–10.5)
nRBC: 0 % (ref 0.0–0.2)

## 2024-04-25 LAB — COMPREHENSIVE METABOLIC PANEL WITH GFR
ALT: 10 U/L (ref 0–44)
AST: 25 U/L (ref 15–41)
Albumin: 3.7 g/dL (ref 3.5–5.0)
Alkaline Phosphatase: 68 U/L (ref 38–126)
Anion gap: 10 (ref 5–15)
BUN: 13 mg/dL (ref 8–23)
CO2: 25 mmol/L (ref 22–32)
Calcium: 9.2 mg/dL (ref 8.9–10.3)
Chloride: 101 mmol/L (ref 98–111)
Creatinine, Ser: 0.39 mg/dL — ABNORMAL LOW (ref 0.44–1.00)
GFR, Estimated: >60 mL/min (ref >60)
Glucose, Bld: 94 mg/dL (ref 70–99)
Potassium: 3.8 mmol/L (ref 3.5–5.1)
Sodium: 136 mmol/L (ref 135–145)
Total Bilirubin: 0.5 mg/dL (ref 0.0–1.2)
Total Protein: 8 g/dL (ref 6.5–8.1)

## 2024-04-25 LAB — POC OCCULT BLOOD, ED: Fecal Occult Bld: NEGATIVE

## 2024-04-25 MED ORDER — ONDANSETRON HCL 4 MG/2ML IJ SOLN: 4.0000 mg | Freq: Four times a day (QID) | INTRAMUSCULAR | Status: DC | PRN

## 2024-04-25 MED ORDER — ACETAMINOPHEN 325 MG PO TABS
650.0000 mg | ORAL_TABLET | Freq: Four times a day (QID) | ORAL | Status: DC | PRN
  Administered 2024-04-25: 650 mg via ORAL
  Filled 2024-04-25: qty 2

## 2024-04-25 MED ORDER — ONDANSETRON HCL 4 MG PO TABS: 4.0000 mg | ORAL_TABLET | Freq: Four times a day (QID) | ORAL | Status: DC | PRN

## 2024-04-25 MED ORDER — CIPROFLOXACIN IN D5W 400 MG/200ML IV SOLN
400.0000 mg | Freq: Once | INTRAVENOUS | Status: AC
  Administered 2024-04-25: 400 mg via INTRAVENOUS
  Filled 2024-04-25: qty 200

## 2024-04-25 MED ORDER — CIPROFLOXACIN IN D5W 400 MG/200ML IV SOLN
400.0000 mg | Freq: Two times a day (BID) | INTRAVENOUS | Status: DC
  Administered 2024-04-26 – 2024-04-28 (×5): 400 mg via INTRAVENOUS
  Filled 2024-04-25 (×5): qty 200

## 2024-04-25 MED ORDER — ACETAMINOPHEN 650 MG RE SUPP: 650.0000 mg | Freq: Four times a day (QID) | RECTAL | Status: DC | PRN

## 2024-04-25 MED ORDER — PANTOPRAZOLE SODIUM 40 MG IV SOLR
40.0000 mg | Freq: Once | INTRAVENOUS | Status: AC
  Administered 2024-04-25: 40 mg via INTRAVENOUS
  Filled 2024-04-25: qty 10

## 2024-04-25 NOTE — ED Provider Notes (Signed)
 [place] EMERGENCY DEPARTMENT AT [facility] Provider Note   CSN: [id number] Arrival date & time: 04/25/24  1323     Patient presents with: Urinary Frequency   Carolyn Henderson is a 89 y.o. female with past medical history significant for hypertension, hyperlipidemia osteopenia who presents from facility, she had several new worrisome changes on lab work including recently diagnosed with UTI.  More concerning however her hemoglobin has dropped from normal around 4 months ago at around 11-7.5 reportedly with a positive Hemoccult.  She does endorse dark stool.  She reports occasional use of ibuprofen , does not take any kind of blood thinner.  No previous history of similar.  Reports that she has been feeling weak, dizzy.  She endorses some urinary frequency.  She denies any chest pain, shortness of breath.    Urinary Frequency       Prior to Admission medications   Medication Sig Start Date End Date Taking? Authorizing Provider  acetaminophen  (TYLENOL ) 325 MG tablet Take 2 tablets (650 mg total) by mouth every 6 (six) hours as needed for mild pain (or Fever >/= 101). 08/05/14   Danella Cough, PA-C  amLODipine  (NORVASC ) 2.5 MG tablet Take 1 tablet (2.5 mg total) by mouth daily. 10/21/15   Burchette, Wolm ORN, MD  benzonatate  (TESSALON ) 100 MG capsule Take 1 capsule (100 mg total) by mouth 3 (three) times daily as needed for cough. 01/17/16   Burchette, Wolm ORN, MD  Calcium  Carbonate-Vit D-Min (CALCIUM  1200 PO) Take by mouth.    [provider]  Carboxymethylcell-Hypromellose (GENTEAL OP) Apply 1 drop to eye at bedtime.    [provider]  cetirizine (ZYRTEC) 10 MG tablet Take 10 mg by mouth at bedtime.     [provider]  cholecalciferol  (VITAMIN D ) 1000 UNITS tablet Take 1,000 Units by mouth daily.    [provider]  ciprofloxacin  (CIPRO ) 500 MG tablet Take 1 tablet (500 mg total) by mouth 2 (two) times daily. 12/24/23   Dean Clarity, MD   clobetasol  ointment (TEMOVATE ) 0.05 % Apply 1 application topically 2 (two) times daily as needed (for eczema).    [provider]  clotrimazole -betamethasone  (LOTRISONE ) cream Apply 1 application topically 2 (two) times daily. Patient taking differently: Apply 1 application  topically 2 (two) times daily as needed (itching, eczema). 10/27/15   Burchette, Wolm ORN, MD  diphenhydrAMINE  (BENADRYL ) 25 MG tablet Take 25-50 mg by mouth every 6 (six) hours as needed for itching or allergies.     [provider]  doxycycline  (VIBRA -TABS) 100 MG tablet Take 1 tablet (100 mg total) by mouth 2 (two) times daily. 01/17/16   Burchette, Wolm ORN, MD  ferrous sulfate  324 (65 Fe) MG TBEC Take 324 mg by mouth daily. 04/16/16   [provider]  gabapentin  (NEURONTIN ) 300 MG capsule TAKE 1 TO 3 CAPSULES BY MOUTH EVERY DAY AS DIRECTED Patient taking differently: Take 300 mg by mouth 3 (three) times daily. 08/18/15   Krystal Reyes LABOR, MD  ibuprofen  (ADVIL ) 400 MG tablet Take 1 tablet (400 mg total) by mouth every 6 (six) hours as needed for up to 20 doses. 03/12/20   Fawze, Mina A, PA-C  Multiple Vitamins-Minerals (PRESERVISION AREDS 2) CAPS Take 1 capsule by mouth 2 (two) times daily.     [provider]  Omega-3 Fatty Acids (FISH OIL PO) Take 1 capsule by mouth 2 (two) times daily.    [provider]  ondansetron  (ZOFRAN  ODT) 4 MG disintegrating tablet  4mg  ODT q4 hours prn nausea/vomit 04/20/16   Emil Share, DO  ondansetron  (ZOFRAN -ODT) 4 MG disintegrating tablet Take 1 tablet (4 mg total) by mouth every 8 (eight) hours as needed. 12/24/23   Dean Clarity, MD  oxyCODONE  (ROXICODONE ) 5 MG immediate release tablet Take 0.5 tablets (2.5 mg total) by mouth every 4 (four) hours as needed for severe pain. 04/20/16   Emil Share, DO  oxyCODONE -acetaminophen  (PERCOCET/ROXICET) 5-325 MG tablet Take 1 tablet by mouth every 6 (six) hours as needed for severe pain (pain score 7-10). 12/24/23    Haviland, Julie, MD  Polyethyl Glycol-Propyl Glycol (SYSTANE OP) Apply 1 drop to eye 3 (three) times daily as needed (for dry eyes).     [provider]  Probiotic Product (ACIDOPHILUS/GOAT MILK) CAPS Take 1 capsule by mouth every evening.    [provider]  rOPINIRole  (REQUIP ) 0.25 MG tablet Take 1 tablet by mouth at  bedtime 03/19/16   Burchette, Wolm ORN, MD  SYNTHROID  125 MCG tablet TAKE 1 TABLET BY MOUTH EVERY MORNING BEFORE BREAKFAST 10/01/16   Burchette, Wolm ORN, MD  tacrolimus (PROTOPIC) 0.1 % ointment Apply 0.1 % topically daily as needed (for itchy eyelids).     [provider]  vitamin B-12 (CYANOCOBALAMIN ) 1000 MCG tablet Take 1,000 mcg by mouth daily.    [provider]    Allergies: Cephalosporins, Codeine sulfate, Hydrocodone , Percocet [oxycodone -acetaminophen ], Tramadol , and Penicillins    Review of Systems  Genitourinary:  Positive for frequency.  All other systems reviewed and are negative.   Updated Vital Signs BP (!) 154/67   Pulse 66   Temp 97.7 F (36.5 C) (Oral)   Resp 15   Ht 5' 4 (1.626 m)   Wt 65.3 kg   SpO2 97%   BMI 24.72 kg/m   Physical Exam Vitals and nursing note reviewed.  Constitutional:      General: She is not in acute distress.    Appearance: Normal appearance.  HENT:     Head: Normocephalic and atraumatic.  Eyes:     General:        Right eye: No discharge.        Left eye: No discharge.  Cardiovascular:     Rate and Rhythm: Normal rate and regular rhythm.     Heart sounds: No murmur heard.    No friction rub. No gallop.  Pulmonary:     Effort: Pulmonary effort is normal.     Breath sounds: Normal breath sounds.  Abdominal:     General: Bowel sounds are normal.     Palpations: Abdomen is soft.  Skin:    General: Skin is warm and dry.     Capillary Refill: Capillary refill takes less than 2 seconds.  Neurological:     Mental Status: She is alert and oriented to person, place, and time.   Psychiatric:        Mood and Affect: Mood normal.        Behavior: Behavior normal.     (all labs ordered are listed, but only abnormal results are displayed) Labs Reviewed  CBC - Abnormal; Notable for the following components:      Result Value   Hemoglobin 10.1 (*)    HCT 34.4 (*)    MCV 78.2 (*)    MCH 23.0 (*)    MCHC 29.4 (*)    RDW 21.4 (*)    Platelets 490 (*)    All other components within normal limits  URINALYSIS, ROUTINE W  REFLEX MICROSCOPIC - Abnormal; Notable for the following components:   Nitrite POSITIVE (*)    Leukocytes,Ua TRACE (*)    Bacteria, UA RARE (*)    All other components within normal limits  COMPREHENSIVE METABOLIC PANEL WITH GFR - Abnormal; Notable for the following components:   Creatinine, Ser 0.39 (*)    All other components within normal limits  URINE CULTURE  POC OCCULT BLOOD, ED    EKG: None  Radiology: No results found.   Procedures   Medications Ordered in the ED  ciprofloxacin  (CIPRO ) IVPB 400 mg (400 mg Intravenous New Bag/Given 04/25/24 1606)  ondansetron  (ZOFRAN ) tablet 4 mg (has no administration in time range)    Or  ondansetron  (ZOFRAN ) injection 4 mg (has no administration in time range)  acetaminophen  (TYLENOL ) tablet 650 mg (has no administration in time range)    Or  acetaminophen  (TYLENOL ) suppository 650 mg (has no administration in time range)  ciprofloxacin  (CIPRO ) IVPB 400 mg (has no administration in time range)  pantoprazole  (PROTONIX ) injection 40 mg (40 mg Intravenous Given 04/25/24 1606)                                    Medical Decision Making Amount and/or Complexity of Data Reviewed Labs: ordered.  Risk Prescription drug management. Decision regarding hospitalization.   This patient is a 89 y.o. female  who presents to the ED for concern of weakness, dizziness, possible UTI, possible new acute blood loss anemia.   Differential diagnoses prior to evaluation: The emergent differential  diagnosis includes, but is not limited to,  CVA, spinal cord injury, ACS, arrhythmia, syncope, orthostatic hypotension, sepsis, hypoglycemia, hypoxia, electrolyte disturbance, endocrine disorder, anemia, environmental exposure, polypharmacy . This is not an exhaustive differential.   Past Medical History / Co-morbidities / Social History: hypertension, hyperlipidemia osteopenia  Additional history: Chart reviewed. Pertinent results include: Reviewed records from her facility, notably with hemoglobin of 7.5, Hemoccult positive stool just a few days ago  Physical Exam: Physical exam performed. The pertinent findings include: Vital signs stable other than mild hypertension, blood pressure 154/67.  Rectal exam without grossly melanotic stool, no active bleeding.  Lab Tests/Imaging studies: I personally interpreted labs/imaging and the pertinent results include: CBC notable for mild anemia, hemoglobin 10.1, drop from baseline around 12, platelets elevated 490.  CMP overall unremarkable, UA with nitrate positive, leukocytes, and rare bacteria, consistent with acute urinary tract infection, her Hemoccult is negative..    Medications: I ordered medication including Rocephin for acute urinary tract infection, Protonix  for possible resolved upper GI bleed.  I have reviewed the patients home medicines and have made adjustments as needed.  Consults: Spoke with hospitalist, Dr. Celinda who agrees to admission for weakness, dizziness, urinary tract infection, new anemia, with some concern raised by facility for possible melena.   Disposition: After consideration of the diagnostic results and the patients response to treatment, I feel that patient would benefit from admission as discussed above.    Final diagnoses:  None    ED Discharge Orders     None          Rosan Sherlean VEAR DEVONNA 04/25/24 1640    Laurice Maude BROCKS, MD 04/26/24 1601

## 2024-04-25 NOTE — ED Triage Notes (Addendum)
 Patient BIB [facility] from [facility]. Diagnosed with UTI Thursday, did not start antibiotics yet. Said she feels run down, increased urinary frequency, dizzy. Denies burning.

## 2024-04-25 NOTE — H&P (Signed)
 History and Physical    Patient: Carolyn Henderson FMW:[id number] DOB: 14-Jul-1930 DOA: 04/25/2024 DOS: the patient was seen and examined on 04/25/2024 PCP: Brutus Delon SAUNDERS, NP  Patient coming from: Home  Chief Complaint:  Chief Complaint  Patient presents with   Urinary Frequency   HPI: Carolyn Henderson is a 89 y.o. female with medical history significant of arthritis, diverticulosis, herpes zoster, hyperlipidemia, hypertension, osteopenia, hypothyroidism who presented to the emergency department from her nursing facility with complaints of urinary frequency and mild suprapubic tenderness for the past 3 days. No abdominal pain, nausea, emesis, diarrhea, constipation, melena or hematochezia. No flank pain, dysuria or hematuria. She denied fever, chills, rhinorrhea, sore throat, wheezing or hemoptysis. No chest pain, palpitations, diaphoresis, PND, orthopnea or pitting edema of the lower extremities. No polyuria, polydipsia, polyphagia or blurred vision.   Lab work: Urinalysis had positive nitrites, trace leukocyte esterase, 11-20 WBC and rare bacteria.  Fecal occult blood was negative here, but was recently positive at her nursing facility.  CBC showed white count 7.6, hemoglobin 10.1 g/dL with an MCV of 21.7 fL and platelets 490.  She recently had a hemoglobin level of 7.5 g/dL at the nursing facility.  CMP showed a creatinine of 0.39 mg/dL, but was otherwise normal.  Allergic to penicillin and cephalosporins.  Her two most recent urine cultures show Klebsiella sensitive to ciprofloxacin .  ED course: Initial vital signs were temperature 97.7 F, pulse 66, respiration 15, BP 154/67 mmHg O2 sat 97% on room air.  The patient received ciprofloxacin  400 mg IVPB and pantoprazole  40 mg IVP.   Review of Systems: As mentioned in the history of present illness. All other systems reviewed and are negative. Past Medical History:  Diagnosis Date   Arthritis    Complication of anesthesia    Difficulty  sleeping    TAKES TYLENOL  PM NIGHTLY   Diverticulosis    GERD (gastroesophageal reflux disease)    ONLY OCCASIONAL   History of shingles    Hyperlipidemia    Hypertension    Osteopenia    PONV (postoperative nausea and vomiting)    Precancerous skin lesion    Shortness of breath    WITH EXERTION   Thyroid  disease    Past Surgical History:  Procedure Laterality Date   ABDOMINAL HYSTERECTOMY     BACK SURGERY     CATARACT EXTRACTION     COLON SURGERY  1983   COLON RESECTION   JOINT REPLACEMENT     TOTAL KNEE 2012 / BIL TOTAL HIPS    LUMBAR LAMINECTOMY     TONSILLECTOMY     TOTAL HIP ARTHROPLASTY  1999 / 2007   BILATERAL   TOTAL HIP REVISION Right 08/02/2014   Procedure: RIGHT TOTAL HIP REVISION;  Surgeon: Donnice JONETTA Car, MD;  Location: [facility];  Service: Orthopedics;  Laterality: Right;   Social History:  reports that she quit smoking about 31 years ago. Her smoking use included cigarettes. She has never used smokeless tobacco. She reports that she does not drink alcohol  and does not use drugs.  Allergies  Allergen Reactions   Cephalosporins     Unknown reaction    Codeine Sulfate Nausea And Vomiting   Hydrocodone  Itching and Other (See Comments)   Percocet [Oxycodone -Acetaminophen ] Nausea And Vomiting   Tramadol  Nausea Only   Penicillins Swelling and Rash    Has patient had a PCN reaction causing immediate rash, facial/tongue/throat swelling, SOB or lightheadedness with hypotension: yes Has patient had a PCN reaction  causing severe rash involving mucus membranes or skin necrosis: no Has patient had a PCN reaction that required hospitalization: yes Has patient had a PCN reaction occurring within the last 10 years: no  If all of the above answers are NO, then may proceed with Cephalosporin use.     Family History  Problem Relation Age of Onset   Hypertension Other        fhx   Cancer Other        prostate/fhx    Prior to Admission medications   Medication Sig  Start Date End Date Taking? Authorizing Provider  acetaminophen  (TYLENOL ) 325 MG tablet Take 2 tablets (650 mg total) by mouth every 6 (six) hours as needed for mild pain (or Fever >/= 101). 08/05/14   Danella Cough, PA-C  amLODipine  (NORVASC ) 2.5 MG tablet Take 1 tablet (2.5 mg total) by mouth daily. 10/21/15   Burchette, Wolm ORN, MD  benzonatate  (TESSALON ) 100 MG capsule Take 1 capsule (100 mg total) by mouth 3 (three) times daily as needed for cough. 01/17/16   Burchette, Wolm ORN, MD  Calcium  Carbonate-Vit D-Min (CALCIUM  1200 PO) Take by mouth.    [provider]  Carboxymethylcell-Hypromellose (GENTEAL OP) Apply 1 drop to eye at bedtime.    [provider]  cetirizine (ZYRTEC) 10 MG tablet Take 10 mg by mouth at bedtime.     [provider]  cholecalciferol  (VITAMIN D ) 1000 UNITS tablet Take 1,000 Units by mouth daily.    [provider]  ciprofloxacin  (CIPRO ) 500 MG tablet Take 1 tablet (500 mg total) by mouth 2 (two) times daily. 12/24/23   Dean Clarity, MD  clobetasol  ointment (TEMOVATE ) 0.05 % Apply 1 application topically 2 (two) times daily as needed (for eczema).    [provider]  clotrimazole -betamethasone  (LOTRISONE ) cream Apply 1 application topically 2 (two) times daily. Patient taking differently: Apply 1 application  topically 2 (two) times daily as needed (itching, eczema). 10/27/15   Burchette, Wolm ORN, MD  diphenhydrAMINE  (BENADRYL ) 25 MG tablet Take 25-50 mg by mouth every 6 (six) hours as needed for itching or allergies.     [provider]  doxycycline  (VIBRA -TABS) 100 MG tablet Take 1 tablet (100 mg total) by mouth 2 (two) times daily. 01/17/16   Burchette, Wolm ORN, MD  ferrous sulfate  324 (65 Fe) MG TBEC Take 324 mg by mouth daily. 04/16/16   [provider]  gabapentin  (NEURONTIN ) 300 MG capsule TAKE 1 TO 3 CAPSULES BY MOUTH EVERY DAY AS DIRECTED Patient taking differently: Take 300 mg by mouth 3 (three) times  daily. 08/18/15   Krystal Reyes LABOR, MD  ibuprofen  (ADVIL ) 400 MG tablet Take 1 tablet (400 mg total) by mouth every 6 (six) hours as needed for up to 20 doses. 03/12/20   Fawze, Mina A, PA-C  Multiple Vitamins-Minerals (PRESERVISION AREDS 2) CAPS Take 1 capsule by mouth 2 (two) times daily.     [provider]  Omega-3 Fatty Acids (FISH OIL PO) Take 1 capsule by mouth 2 (two) times daily.    [provider]  ondansetron  (ZOFRAN  ODT) 4 MG disintegrating tablet 4mg  ODT q4 hours prn nausea/vomit 04/20/16   Floyd, Dan, DO  ondansetron  (ZOFRAN -ODT) 4 MG disintegrating tablet Take 1 tablet (4 mg total) by mouth every 8 (eight) hours as needed. 12/24/23   Dean Clarity, MD  oxyCODONE  (ROXICODONE ) 5 MG immediate release tablet Take 0.5 tablets (2.5 mg total) by mouth every 4 (four) hours as needed for severe  pain. 04/20/16   Emil Share, DO  oxyCODONE -acetaminophen  (PERCOCET/ROXICET) 5-325 MG tablet Take 1 tablet by mouth every 6 (six) hours as needed for severe pain (pain score 7-10). 12/24/23   Haviland, Julie, MD  Polyethyl Glycol-Propyl Glycol (SYSTANE OP) Apply 1 drop to eye 3 (three) times daily as needed (for dry eyes).     [provider]  Probiotic Product (ACIDOPHILUS/GOAT MILK) CAPS Take 1 capsule by mouth every evening.    [provider]  rOPINIRole  (REQUIP ) 0.25 MG tablet Take 1 tablet by mouth at  bedtime 03/19/16   Burchette, Wolm ORN, MD  SYNTHROID  125 MCG tablet TAKE 1 TABLET BY MOUTH EVERY MORNING BEFORE BREAKFAST 10/01/16   Burchette, Wolm ORN, MD  tacrolimus (PROTOPIC) 0.1 % ointment Apply 0.1 % topically daily as needed (for itchy eyelids).     [provider]  vitamin B-12 (CYANOCOBALAMIN ) 1000 MCG tablet Take 1,000 mcg by mouth daily.    [provider]    Physical Exam: Vitals:   04/25/24 1332 04/25/24 1335  BP:  (!) 154/67  Pulse:  66  Resp:  15  Temp:  97.7 F (36.5 C)  TempSrc:  Oral  SpO2:  97%  Weight: 65.3 kg   Height: 5' 4  (1.626 m)    Physical Exam Vitals and nursing note reviewed.  Constitutional:      General: She is awake. She is not in acute distress.    Appearance: Normal appearance. She is ill-appearing.  HENT:     Head: Normocephalic.     Nose: No rhinorrhea.     Mouth/Throat:     Mouth: Mucous membranes are dry.  Eyes:     General: No scleral icterus.    Pupils: Pupils are equal, round, and reactive to light.  Neck:     Vascular: No JVD.  Cardiovascular:     Rate and Rhythm: Normal rate and regular rhythm.     Heart sounds: S1 normal and S2 normal.  Pulmonary:     Effort: Pulmonary effort is normal.     Breath sounds: No wheezing, rhonchi or rales.  Abdominal:     General: Bowel sounds are normal. There is no distension.     Palpations: Abdomen is soft.     Tenderness: There is abdominal tenderness in the suprapubic area. There is no right CVA tenderness, left CVA tenderness, guarding or rebound.  Musculoskeletal:     Cervical back: Neck supple.     Right lower leg: No edema.     Left lower leg: No edema.  Skin:    General: Skin is warm and dry.  Neurological:     General: No focal deficit present.     Mental Status: She is alert and oriented to person, place, and time.  Psychiatric:        Mood and Affect: Mood normal.        Behavior: Behavior normal. Behavior is cooperative.    Data Reviewed:  Results are pending, will review when available. 07/05/2023 echocardiogram report. IMPRESSIONS:   1. Left ventricular ejection fraction, by estimation, is 60 to 65%. Left  ventricular ejection fraction by 3D volume is 62 %. The left ventricle has  normal function. The left ventricle has no regional wall motion  abnormalities. There is moderate  concentric left ventricular hypertrophy. Left ventricular diastolic  parameters are consistent with Grade I diastolic dysfunction (impaired  relaxation).   2. Right ventricular systolic function is normal. The right ventricular  size is  normal.  There is normal pulmonary artery systolic pressure. The  estimated right ventricular systolic pressure is 26.4 mmHg.   3. Left atrial size was mildly dilated.   4. The mitral valve is degenerative. Mild mitral valve regurgitation. No  evidence of mitral stenosis.   5. The aortic valve is tricuspid. There is moderate calcification of the  aortic valve. There is moderate thickening of the aortic valve. Aortic  valve regurgitation is trivial. Aortic valve sclerosis/calcification is  present, without any evidence of  aortic stenosis.   6. The inferior vena cava is normal in size with greater than 50%  respiratory variability, suggesting right atrial pressure of 3 mmHg.   Assessment and Plan: Principal Problem:   Acute UTI (urinary tract infection) Admit to MedSurg r/inpatient. Analgesics as needed.   Antiemetics as needed. Continue ciprofloxacin  400 mg IVPB every 12 hours. Follow-up urine culture and sensitivity.  Active Problems:   Microcytosis In the setting of:   Iron deficiency anemia due to chronic blood loss  Monitor hematocrit and hemoglobin. Continue iron supplementation.    Postherpetic neuralgia Continue gabapentin  300 mg p.o. 3 times daily.    Hypothyroidism Continue levothyroxine  125 mcg p.o. daily.    Hyperlipidemia Lifestyle modifications Follow-up with primary care provider.    Essential hypertension Continue amlodipine  2.5 mg p.o. daily.    Grade I diastolic dysfunction No signs of volume overload at the moment.    Cobalamin deficiency Continue vitamin B12 supplementation.     Advance Care Planning:   Code Status: Full Code   Consults:   Family Communication: Her daughter was at bedside.  Severity of Illness: The appropriate patient status for this patient is INPATIENT. Inpatient status is judged to be reasonable and necessary in order to provide the required intensity of service to ensure the patient's safety. The patient's presenting  symptoms, physical exam findings, and initial radiographic and laboratory data in the context of their chronic comorbidities is felt to place them at high risk for further clinical deterioration. Furthermore, it is not anticipated that the patient will be medically stable for discharge from the hospital within 2 midnights of admission.   * I certify that at the point of admission it is my clinical judgment that the patient will require inpatient hospital care spanning beyond 2 midnights from the point of admission due to high intensity of service, high risk for further deterioration and high frequency of surveillance required.*  Author: Alm Dorn Castor, MD 04/25/2024 3:56 PM  For on call review [email or web address].   This document was prepared using Dragon voice recognition software and may contain some unintended transcription errors.

## 2024-04-25 NOTE — Plan of Care (Signed)

## 2024-04-26 DIAGNOSIS — N390 Urinary tract infection, site not specified: Secondary | ICD-10-CM | POA: Diagnosis not present

## 2024-04-26 LAB — RETICULOCYTES
Immature Retic Fract: 19.3 % — ABNORMAL HIGH (ref 2.3–15.9)
RBC.: 3.86 MIL/uL — ABNORMAL LOW (ref 3.87–5.11)
Retic Count, Absolute: 88.4 K/uL (ref 19.0–186.0)
Retic Ct Pct: 2.3 % (ref 0.4–3.1)

## 2024-04-26 LAB — COMPREHENSIVE METABOLIC PANEL WITH GFR
ALT: 9 U/L (ref 0–44)
AST: 18 U/L (ref 15–41)
Albumin: 2.9 g/dL — ABNORMAL LOW (ref 3.5–5.0)
Alkaline Phosphatase: 48 U/L (ref 38–126)
Anion gap: 11 (ref 5–15)
BUN: 12 mg/dL (ref 8–23)
CO2: 23 mmol/L (ref 22–32)
Calcium: 8.4 mg/dL — ABNORMAL LOW (ref 8.9–10.3)
Chloride: 99 mmol/L (ref 98–111)
Creatinine, Ser: 0.4 mg/dL — ABNORMAL LOW (ref 0.44–1.00)
GFR, Estimated: >60 mL/min (ref >60)
Glucose, Bld: 119 mg/dL — ABNORMAL HIGH (ref 70–99)
Potassium: 3.6 mmol/L (ref 3.5–5.1)
Sodium: 133 mmol/L — ABNORMAL LOW (ref 135–145)
Total Bilirubin: 0.6 mg/dL (ref 0.0–1.2)
Total Protein: 6 g/dL — ABNORMAL LOW (ref 6.5–8.1)

## 2024-04-26 LAB — IRON AND TIBC
Iron: 17 ug/dL — ABNORMAL LOW (ref 28–170)
Saturation Ratios: 4 % — ABNORMAL LOW (ref 10.4–31.8)
TIBC: 388 ug/dL (ref 250–450)
UIBC: 371 ug/dL

## 2024-04-26 LAB — CBC
HCT: 28.6 % — ABNORMAL LOW (ref 36.0–46.0)
Hemoglobin: 8.3 g/dL — ABNORMAL LOW (ref 12.0–15.0)
MCH: 22.6 pg — ABNORMAL LOW (ref 26.0–34.0)
MCHC: 29 g/dL — ABNORMAL LOW (ref 30.0–36.0)
MCV: 77.9 fL — ABNORMAL LOW (ref 80.0–100.0)
Platelets: 429 K/uL — ABNORMAL HIGH (ref 150–400)
RBC: 3.67 MIL/uL — ABNORMAL LOW (ref 3.87–5.11)
RDW: 21.7 % — ABNORMAL HIGH (ref 11.5–15.5)
WBC: 5.4 K/uL (ref 4.0–10.5)
nRBC: 0 % (ref 0.0–0.2)

## 2024-04-26 LAB — VITAMIN B12: Vitamin B-12: 450 pg/mL (ref 180–914)

## 2024-04-26 LAB — FERRITIN: Ferritin: 43 ng/mL (ref 11–307)

## 2024-04-26 LAB — FOLATE: Folate: 11.9 ng/mL (ref >5.9)

## 2024-04-26 MED ORDER — ALBUTEROL SULFATE (2.5 MG/3ML) 0.083% IN NEBU: 2.5000 mg | INHALATION_SOLUTION | Freq: Four times a day (QID) | RESPIRATORY_TRACT | Status: DC | PRN

## 2024-04-26 MED ORDER — ROPINIROLE HCL 0.25 MG PO TABS
0.2500 mg | ORAL_TABLET | Freq: Once | ORAL | Status: AC
  Administered 2024-04-26: 0.25 mg via ORAL
  Filled 2024-04-26: qty 1

## 2024-04-26 MED ORDER — LEVOTHYROXINE SODIUM 112 MCG PO TABS
112.0000 ug | ORAL_TABLET | Freq: Every day | ORAL | Status: DC
  Administered 2024-04-27 – 2024-04-28 (×2): 112 ug via ORAL
  Filled 2024-04-26 (×2): qty 1

## 2024-04-26 MED ORDER — RISAQUAD PO CAPS
1.0000 | ORAL_CAPSULE | Freq: Every day | ORAL | Status: DC
  Administered 2024-04-27 – 2024-04-28 (×2): 1 via ORAL
  Filled 2024-04-26 (×2): qty 1

## 2024-04-26 MED ORDER — POLYETHYL GLYCOL-PROPYL GLYCOL 0.4-0.3 % OP GEL: Freq: Three times a day (TID) | OPHTHALMIC | Status: DC | PRN

## 2024-04-26 MED ORDER — LORATADINE 10 MG PO TABS
10.0000 mg | ORAL_TABLET | Freq: Every day | ORAL | Status: DC
  Administered 2024-04-26 – 2024-04-28 (×3): 10 mg via ORAL
  Filled 2024-04-26 (×3): qty 1

## 2024-04-26 MED ORDER — POLYVINYL ALCOHOL 1.4 % OP SOLN: 1.0000 [drp] | Freq: Three times a day (TID) | OPHTHALMIC | Status: DC | PRN

## 2024-04-26 MED ORDER — FERROUS SULFATE 324 (65 FE) MG PO TBEC: 324.0000 mg | DELAYED_RELEASE_TABLET | Freq: Every day | ORAL | Status: DC

## 2024-04-26 MED ORDER — BENZONATATE 100 MG PO CAPS: 100.0000 mg | ORAL_CAPSULE | Freq: Three times a day (TID) | ORAL | Status: DC | PRN

## 2024-04-26 MED ORDER — GABAPENTIN 300 MG PO CAPS
300.0000 mg | ORAL_CAPSULE | Freq: Two times a day (BID) | ORAL | Status: DC
  Administered 2024-04-26 – 2024-04-28 (×5): 300 mg via ORAL
  Filled 2024-04-26 (×5): qty 1

## 2024-04-26 MED ORDER — FLUTICASONE FUROATE-VILANTEROL 100-25 MCG/ACT IN AEPB
1.0000 | INHALATION_SPRAY | Freq: Every day | RESPIRATORY_TRACT | Status: DC
  Administered 2024-04-27 – 2024-04-28 (×2): 1 via RESPIRATORY_TRACT
  Filled 2024-04-26: qty 28

## 2024-04-26 MED ORDER — ROPINIROLE HCL 0.25 MG PO TABS
0.2500 mg | ORAL_TABLET | Freq: Every day | ORAL | Status: DC
  Administered 2024-04-27: 0.25 mg via ORAL
  Filled 2024-04-26: qty 1

## 2024-04-26 MED ORDER — ACIDOPHILUS/GOAT MILK PO CAPS: 1.0000 | ORAL_CAPSULE | Freq: Every evening | ORAL | Status: DC

## 2024-04-26 MED ORDER — FERROUS SULFATE 325 (65 FE) MG PO TABS
325.0000 mg | ORAL_TABLET | Freq: Every day | ORAL | Status: DC
  Administered 2024-04-27 – 2024-04-28 (×2): 325 mg via ORAL
  Filled 2024-04-26 (×2): qty 1

## 2024-04-26 MED ORDER — DIPHENHYDRAMINE HCL 25 MG PO CAPS
25.0000 mg | ORAL_CAPSULE | Freq: Once | ORAL | Status: AC
  Administered 2024-04-26: 25 mg via ORAL
  Filled 2024-04-26: qty 1

## 2024-04-26 MED ORDER — ALBUTEROL SULFATE HFA 108 (90 BASE) MCG/ACT IN AERS: 1.0000 | INHALATION_SPRAY | Freq: Four times a day (QID) | RESPIRATORY_TRACT | Status: DC | PRN

## 2024-04-26 MED ORDER — AMLODIPINE BESYLATE 5 MG PO TABS
2.5000 mg | ORAL_TABLET | Freq: Every day | ORAL | Status: DC
Start: 2024-04-26 — End: 2024-04-28
  Administered 2024-04-26 – 2024-04-28 (×3): 2.5 mg via ORAL
  Filled 2024-04-26 (×3): qty 1

## 2024-04-26 NOTE — Plan of Care (Signed)

## 2024-04-26 NOTE — Progress Notes (Addendum)
 PROGRESS NOTE  Carolyn Henderson FMW:[id number] DOB: May 20, 1930 DOA: 04/25/2024 PCP: Brutus Delon SAUNDERS, NP   LOS: 1 day   Brief narrative:  Carolyn Henderson is a 89 y.o. female with past medical history significant of arthritis, diverticulosis, herpes zoster, hyperlipidemia, hypertension, osteopenia, hypothyroidism presented to the hospital from independent living facility with complaints of urinary frequency and mild suprapubic tenderness for 3 days.  In the ED, patient had stable vitals.  Labs were notable for no leukocytosis but UA had positive nitrites, trace leukocyte esterase, 11-20 WBC and rare bacteria.  Fecal occult blood was negative here, but was recently positive at her nursing facility.  hemoglobin 10.1 g/dL with an MCV of 21.7 fL and platelets 490.  She recently had a hemoglobin level of 7.5 g/dL at the nursing facility.   Her two most recent urine cultures show Klebsiella sensitive to ciprofloxacin .  Patient received ciprofloxacin  and pantoprazole  in the ED and was admitted hospital for further evaluation and treatment.     Assessment/Plan: Principal Problem:   Acute UTI (urinary tract infection) Active Problems:   Postherpetic neuralgia   Hypothyroidism   Hyperlipidemia   Essential hypertension   Microcytosis   Cobalamin deficiency   Grade I diastolic dysfunction   Iron deficiency anemia due to chronic blood loss  Acute UTI (urinary tract infection) Continue supportive care.  Currently on Cipro  IV.  Patient has difficulty tolerating many other antibiotics.  Follow urine culture and sensitivity.     Microcytosis secondary to iron deficiency anemia due to chronic blood loss  Takes iron as outpatient.  Might need IV iron.  Will get iron studies today.     Postherpetic neuralgia Continue gabapentin      Hypothyroidism Continue Synthroid      Hyperlipidemia Continue lifestyle modification     Essential hypertension Continue amlodipine      Grade I diastolic  dysfunction Appears compensated.  No signs of heart failure   Vitamin B12 deficiency. Continue vitamin B12 supplementation.  Will check anemia panel.  Consider oral iron if necessary.  Debility weakness frailty.  Patient is currently at the independent living facility.  Will get PT evaluation.  Patient's daughter states that she gets physical therapy at her place at this time  DVT prophylaxis: SCDs Start: 04/25/24 1603   Disposition: Independent living facility in 1 to 2 days with home health  Status is: Inpatient Remains inpatient appropriate because: IV antibiotics, pending clinical improvement, pending culture    Code Status:     Code Status: Full Code  Family Communication: Spoke with the patient's daughter  on 04/26/2024  Consultants: None  Procedures: None  Anti-infectives:  Ciprofloxacin   Anti-infectives (From admission, onward)    Start     Dose/Rate Route Frequency Ordered Stop   04/26/24 0400  ciprofloxacin  (CIPRO ) IVPB 400 mg        400 mg 200 mL/hr over 60 Minutes Intravenous Every 12 hours 04/25/24 1603     04/25/24 1600  ciprofloxacin  (CIPRO ) IVPB 400 mg        400 mg 200 mL/hr over 60 Minutes Intravenous  Once 04/25/24 1548 04/25/24 1706        Subjective: Today, patient was seen and examined at bedside.  Patient stated that she was able to ambulate to the bathroom today.  Still complains of generalized weakness and has had urinary symptoms including frequency and urgency.  Objective: Vitals:   04/26/24 0035 04/26/24 0407  BP: (!) 155/74 (!) 152/76  Pulse: 76 70  Resp: 17 17  Temp: 97.9 F (36.6 C) 98 F (36.7 C)  SpO2: 99% 95%    Intake/Output Summary (Last 24 hours) at 04/26/2024 0958 Last data filed at 04/26/2024 0830 Gross per 24 hour  Intake 621.85 ml  Output --  Net 621.85 ml   Filed Weights   04/25/24 1332  Weight: 65.3 kg   Body mass index is 24.72 kg/m.   Physical Exam: GENERAL: Patient is alert awake and oriented. Not  in obvious distress.  Elderly female, thinly built, Communicative, HENT: No scleral pallor or icterus. Pupils equally reactive to light. Oral mucosa is moist NECK: is supple, no gross swelling noted. CHEST: Clear to auscultation. No crackles or wheezes.  Diminished breath sounds bilaterally. CVS: S1 and S2 heard, no murmur. Regular rate and rhythm.  ABDOMEN: Soft, mild lower abdominal tenderness present on palpation, bowel sounds are present. EXTREMITIES: No edema. CNS: Cranial nerves are intact. No focal motor deficits.  Generalized weakness noted SKIN: warm and dry without rashes.  Data Review: I have personally reviewed the following laboratory data and studies,  CBC: Recent Labs  Lab 04/25/24 1450 04/26/24 0510  WBC 7.6 5.4  HGB 10.1* 8.3*  HCT 34.4* 28.6*  MCV 78.2* 77.9*  PLT 490* 429*   Basic Metabolic Panel: Recent Labs  Lab 04/25/24 1450 04/26/24 0510  NA 136 133*  K 3.8 3.6  CL 101 99  CO2 25 23  GLUCOSE 94 119*  BUN 13 12  CREATININE 0.39* 0.40*  CALCIUM  9.2 8.4*   Liver Function Tests: Recent Labs  Lab 04/25/24 1450 04/26/24 0510  AST 25 18  ALT 10 9  ALKPHOS 68 48  BILITOT 0.5 0.6  PROT 8.0 6.0*  ALBUMIN 3.7 2.9*   No results for input(s): LIPASE, AMYLASE in the last 168 hours. No results for input(s): AMMONIA in the last 168 hours. Cardiac Enzymes: No results for input(s): CKTOTAL, CKMB, CKMBINDEX, TROPONINI in the last 168 hours. BNP (last 3 results) No results for input(s): BNP in the last 8760 hours.  ProBNP (last 3 results) No results for input(s): PROBNP in the last 8760 hours.  CBG: No results for input(s): GLUCAP in the last 168 hours. No results found for this or any previous visit (from the past 240 hours).   Studies: No results found.    Chelan Heringer, MD  [facility] 04/26/2024  If 7PM-7AM, please contact night-coverage

## 2024-04-26 NOTE — Progress Notes (Signed)
 Mobility Specialist - Progress Note   04/26/24 1151  Mobility  Activity Ambulated with assistance in hallway  Level of Assistance Standby assist, set-up cues, supervision of patient - no hands on  Assistive Device Front wheel walker  Distance Ambulated (ft) 80 ft  Activity Response Tolerated well  Mobility Referral Yes  Mobility visit 1 Mobility  Mobility Specialist Start Time (ACUTE ONLY) 1132  Mobility Specialist Stop Time (ACUTE ONLY) 1150  Mobility Specialist Time Calculation (min) (ACUTE ONLY) 18 min   Pt received in bed and agreeable to mobility. No complaints during session. Pt to bed after session with all needs met.    [facility]

## 2024-04-27 DIAGNOSIS — N390 Urinary tract infection, site not specified: Secondary | ICD-10-CM | POA: Diagnosis not present

## 2024-04-27 LAB — CBC
HCT: 31.2 % — ABNORMAL LOW (ref 36.0–46.0)
Hemoglobin: 8.8 g/dL — ABNORMAL LOW (ref 12.0–15.0)
MCH: 22.7 pg — ABNORMAL LOW (ref 26.0–34.0)
MCHC: 28.2 g/dL — ABNORMAL LOW (ref 30.0–36.0)
MCV: 80.4 fL (ref 80.0–100.0)
Platelets: 414 K/uL — ABNORMAL HIGH (ref 150–400)
RBC: 3.88 MIL/uL (ref 3.87–5.11)
RDW: 22.4 % — ABNORMAL HIGH (ref 11.5–15.5)
WBC: 8.1 K/uL (ref 4.0–10.5)
nRBC: 0 % (ref 0.0–0.2)

## 2024-04-27 LAB — GLUCOSE, CAPILLARY: Glucose-Capillary: 94 mg/dL (ref 70–99)

## 2024-04-27 NOTE — Evaluation (Signed)
 Physical Therapy Evaluation Patient Details Name: Carolyn Henderson MRN: [id number] DOB: [birth year] Today's Date: 04/27/2024  History of Present Illness  Pt is 89 yo female admitted on 04/25/24 with UTI.  Pt with hx including but not limited to arthritis, diverticulosis, herpes zoster, hyperlipidemia, hypertension, osteopenia, hypothyroidism  Clinical Impression  Pt admitted with above diagnosis. Pt from ILF with her spouse.  She ambulates household distances with RW and independent with ADLs.  She reports was getting PT at her facility.  Does report 1 fall in last year when dressing and fell forward.  Today, pt ambulating 34' with RW and supervision.  She was oriented and demonstrated good safety awareness.  Pt appears near baseline.  No further acute PT needs.  She was getting PT at her ILF -recommend continuing.     If plan is discharge home, recommend the following: Assistance with cooking/housework;Help with stairs or ramp for entrance   Can travel by private vehicle        Equipment Recommendations None recommended by PT  Recommendations for Other Services       Functional Status Assessment Patient has not had a recent decline in their functional status     Precautions / Restrictions Precautions Precautions: Fall      Mobility  Bed Mobility               General bed mobility comments: up EOB with nursing at arrival    Transfers Overall transfer level: Needs assistance Equipment used: Rolling walker (2 wheels) Transfers: Sit to/from Stand Sit to Stand: Supervision           General transfer comment: Performed x 2    Ambulation/Gait Ambulation/Gait assistance: Contact guard assist, Supervision Gait Distance (Feet): 75 Feet Assistive device: Rolling walker (2 wheels) Gait Pattern/deviations: Step-through pattern, Decreased stride length, Trunk flexed Gait velocity: decreased     General Gait Details: Progressed from CGA to supervision; min cues for RW  proximity  Stairs            Wheelchair Mobility     Tilt Bed    Modified Rankin (Stroke Patients Only)       Balance Overall balance assessment: Needs assistance Sitting-balance support: No upper extremity supported Sitting balance-Leahy Scale: Good     Standing balance support: No upper extremity supported, Bilateral upper extremity supported Standing balance-Leahy Scale: Fair Standing balance comment: Could static stand wtihout support; RW to ambulate                             Pertinent Vitals/Pain Pain Assessment Pain Assessment: No/denies pain    Home Living Family/patient expects to be discharged to:: Private residence Living Arrangements: Spouse/significant other Available Help at Discharge: Family;Available 24 hours/day Type of Home: Apartment Home Access: Level entry       Home Layout: One level Home Equipment: Agricultural consultant (2 wheels);Scientist, research (medical) (4 wheels)      Prior Function               Mobility Comments: Walks with RW; mostly household distances ADLs Comments: independent with adls; could do light IADLs     Extremity/Trunk Assessment   Upper Extremity Assessment Upper Extremity Assessment: Overall WFL for tasks assessed    Lower Extremity Assessment Lower Extremity Assessment: Overall WFL for tasks assessed    Cervical / Trunk Assessment Cervical / Trunk Assessment: Other exceptions;Kyphotic Cervical / Trunk Exceptions: hx scolosis, kyphosis, stenosis, ddd  Communication  Cognition Arousal: Alert Behavior During Therapy: WFL for tasks assessed/performed   PT - Cognitive impairments: No apparent impairments                                 Cueing       General Comments      Exercises     Assessment/Plan    PT Assessment Patient does not need any further PT services  PT Problem List         PT Treatment Interventions      PT Goals (Current goals can be  found in the Care Plan section)  Acute Rehab PT Goals Patient Stated Goal: return home PT Goal Formulation: All assessment and education complete, DC therapy    Frequency       Co-evaluation               AM-PAC PT 6 Clicks Mobility  Outcome Measure Help needed turning from your back to your side while in a flat bed without using bedrails?: A Little Help needed moving from lying on your back to sitting on the side of a flat bed without using bedrails?: A Little Help needed moving to and from a bed to a chair (including a wheelchair)?: A Little Help needed standing up from a chair using your arms (e.g., wheelchair or bedside chair)?: A Little Help needed to walk in hospital room?: A Little Help needed climbing 3-5 steps with a railing? : A Little 6 Click Score: 18    End of Session Equipment Utilized During Treatment: Gait belt Activity Tolerance: Patient tolerated treatment well Patient left: in chair;with call bell/phone within reach (5w no alarm pads; pt following commands and knows to call for assist) Nurse Communication: Mobility status PT Visit Diagnosis: Other abnormalities of gait and mobility (R26.89)    Time: 8894-8878 PT Time Calculation (min) (ACUTE ONLY): 16 min   Charges:   PT Evaluation $PT Eval Low Complexity: 1 Low   PT General Charges $$ ACUTE PT VISIT: 1 Visit         Carolyn, PT Acute Rehab Services [facility] [phone]   Carolyn Henderson 04/27/2024, 11:28 AM

## 2024-04-27 NOTE — Plan of Care (Signed)

## 2024-04-27 NOTE — Progress Notes (Signed)
 PROGRESS NOTE  NISHIKA PARKHURST FMW:[id number] DOB: 02/11/1930 DOA: 04/25/2024 PCP: Brutus Delon SAUNDERS, NP   LOS: 2 days   Brief narrative:  FLORIS NEUHAUS is a 89 y.o. female with past medical history significant of arthritis, diverticulosis, herpes zoster, hyperlipidemia, hypertension, osteopenia, hypothyroidism presented to the hospital from independent living facility with complaints of urinary frequency and mild suprapubic tenderness for 3 days.  In the ED, patient had stable vitals.  Labs were notable for no leukocytosis but UA had positive nitrites, trace leukocyte esterase, 11-20 WBC and rare bacteria.  Fecal occult blood was negative here, but was recently positive at her nursing facility.  hemoglobin 10.1 g/dL with an MCV of 21.7 fL and platelets 490.  She recently had a hemoglobin level of 7.5 g/dL at the nursing facility.   Her two most recent urine cultures show Klebsiella sensitive to ciprofloxacin .  Patient received ciprofloxacin  and pantoprazole  in the ED and was admitted hospital for further evaluation and treatment.     Assessment/Plan: Principal Problem:   Acute UTI (urinary tract infection) Active Problems:   Postherpetic neuralgia   Hypothyroidism   Hyperlipidemia   Essential hypertension   Microcytosis   Cobalamin deficiency   Grade I diastolic dysfunction   Iron deficiency anemia due to chronic blood loss  Acute Klebsiella UTI (urinary tract infection) Continue supportive care.  Currently on Cipro  IV.  Patient has difficulty tolerating many other antibiotics.  Follow sensitivity.     Microcytosis secondary to iron deficiency anemia due to chronic blood loss  Takes iron as outpatient.       Postherpetic neuralgia Continue gabapentin      Hypothyroidism Continue Synthroid      Hyperlipidemia Continue lifestyle modification     Essential hypertension Continue amlodipine      Grade I diastolic dysfunction Appears compensated.  No signs of heart failure    Vitamin B12 deficiency. Continue vitamin B12 supplementation.     Debility weakness frailty.  Patient is currently at the independent living facility.  Will get PT evaluation.  Patient's daughter states that she gets physical therapy at her place at this time  DVT prophylaxis: SCDs Start: 04/25/24 1603   Disposition: Independent living facility likely 04/28/2024 with home health  Status is: Inpatient Remains inpatient appropriate because: IV antibiotics, pending sensitivity.    Code Status:     Code Status: Full Code  Family Communication: Spoke with the patient's daughter  on 04/27/2024  Consultants: None  Procedures: None  Anti-infectives:  Ciprofloxacin   Anti-infectives (From admission, onward)    Start     Dose/Rate Route Frequency Ordered Stop   04/26/24 0400  ciprofloxacin  (CIPRO ) IVPB 400 mg        400 mg 200 mL/hr over 60 Minutes Intravenous Every 12 hours 04/25/24 1603     04/25/24 1600  ciprofloxacin  (CIPRO ) IVPB 400 mg        400 mg 200 mL/hr over 60 Minutes Intravenous  Once 04/25/24 1548 04/25/24 1706        Subjective: Today, patient was seen and examined at bedside.  Patient stated that her strength has gotten better was able to stand up some.  Denies any nausea vomiting fever chills or rigor.   Objective: Vitals:   04/26/24 1247 04/27/24 0925  BP: (!) 151/76   Pulse: 83   Resp:    Temp: 98.1 F (36.7 C)   SpO2: 96% 96%    Intake/Output Summary (Last 24 hours) at 04/27/2024 1005 Last data filed at 04/27/2024 0335 Gross  per 24 hour  Intake 928.96 ml  Output --  Net 928.96 ml   Filed Weights   04/25/24 1332  Weight: 65.3 kg   Body mass index is 24.72 kg/m.   Physical Exam: GENERAL: Patient is alert awake and Communicative.  Not in obvious distress.  Elderly female, thinly built HENT: No scleral pallor or icterus. Pupils equally reactive to light. Oral mucosa is moist NECK: is supple, no gross swelling noted. CHEST: Clear to  auscultation. No crackles or wheezes.  Diminished breath sounds bilaterally. CVS: S1 and S2 heard, no murmur. Regular rate and rhythm.  ABDOMEN: Soft, nontender bowel sounds are present. EXTREMITIES: No edema. CNS: Cranial nerves are intact. No focal motor deficits.  Generalized weakness noted SKIN: warm and dry without rashes.  Data Review: I have personally reviewed the following laboratory data and studies,  CBC: Recent Labs  Lab 04/25/24 1450 04/26/24 0510  WBC 7.6 5.4  HGB 10.1* 8.3*  HCT 34.4* 28.6*  MCV 78.2* 77.9*  PLT 490* 429*   Basic Metabolic Panel: Recent Labs  Lab 04/25/24 1450 04/26/24 0510  NA 136 133*  K 3.8 3.6  CL 101 99  CO2 25 23  GLUCOSE 94 119*  BUN 13 12  CREATININE 0.39* 0.40*  CALCIUM  9.2 8.4*   Liver Function Tests: Recent Labs  Lab 04/25/24 1450 04/26/24 0510  AST 25 18  ALT 10 9  ALKPHOS 68 48  BILITOT 0.5 0.6  PROT 8.0 6.0*  ALBUMIN 3.7 2.9*   No results for input(s): LIPASE, AMYLASE in the last 168 hours. No results for input(s): AMMONIA in the last 168 hours. Cardiac Enzymes: No results for input(s): CKTOTAL, CKMB, CKMBINDEX, TROPONINI in the last 168 hours. BNP (last 3 results) No results for input(s): BNP in the last 8760 hours.  ProBNP (last 3 results) No results for input(s): PROBNP in the last 8760 hours.  CBG: No results for input(s): GLUCAP in the last 168 hours. Recent Results (from the past 240 hours)  Urine Culture     Status: Abnormal (Preliminary result)   Collection Time: 04/25/24  2:30 PM   Specimen: Urine, Clean Catch  Result Value Ref Range Status   Specimen Description   Final    URINE, CLEAN CATCH Performed at [facility], [address] [place], [place] [address]    Special Requests   Final    NONE Performed at [facility], [address], [place], [place] [address]    Culture (A)  Final    >=100,000 COLONIES/mL KLEBSIELLA  PNEUMONIAE SUSCEPTIBILITIES TO FOLLOW Performed at [facility], [address] [place], [place] [address]    Report Status PENDING  Incomplete     Studies: No results found.    Lorene Klimas, MD  [facility] 04/27/2024  If 7PM-7AM, please contact night-coverage

## 2024-04-27 NOTE — Progress Notes (Signed)
 Arrived in patient's room; patient responding by opening her eyes but not speaking; sternal rub completed with response to pain; vital signs: BP 136/74, HR 64, Oxygen 98 on room air, glucose 94; rapid response RN called and MD notified; rapid response RN evaluated patient and spoke with primary MD; no new orders received; will continue to monitor; bed alarm on and bed in lowest position

## 2024-04-27 NOTE — Plan of Care (Signed)

## 2024-04-27 NOTE — TOC Initial Note (Signed)
 Transition of Care [facility]) - Initial/Assessment Note    Patient Details  Name: Carolyn Henderson MRN: [id number] Date of Birth: [birth year]  Transition of Care [facility]) CM/SW Contact:    Carolyn Glenys DASEN, RN Phone Number: 04/27/2024, 10:57 AM  Clinical Narrative:                 CM spoke with patient and daughter Carolyn Henderson [phone] in the room. PTA patient stated lives at [facility] in an apartment with spouse Carolyn Henderson [phone]; PCP/insurance verified; DME - OVA finder, [place]; HH with [employer] for PT and OT; No oxygen or SDOH; Patient's daughter Carolyn Henderson will transport home at discharge. Care Management will follow progression to discharge.  Expected Discharge Plan: Assisted Living Barriers to Discharge: Continued Medical Work up   Patient Goals and CMS Choice Patient states their goals for this hospitalization and ongoing recovery are:: [facility] with spouse CMS Medicare.gov Compare Post Acute Care list provided to::  (NA)    ownership interest in [facility].provided to:: Parent NA    Expected Discharge Plan and Services In-house Referral: NA Discharge Planning Services: NA Post Acute Care Choice: NA Living arrangements for the past 2 months: Assisted Living Facility [facility])                 DME Arranged: N/A DME Agency: NA       HH Arranged: NA HH Agency: NA        Prior Living Arrangements/Services Living arrangements for the past 2 months: Assisted Living Facility ([facility]) Lives with:: Spouse Patient language and need for interpreter reviewed:: Yes Do you feel safe going back to the place where you live?: Yes      Need for Family Participation in Patient Care: Yes (Comment) Care giver support system in place?: Yes (comment) Current home services: DME, Home OT, Home PT ([facility], [facility], HH w/ [facility]) Criminal Activity/Legal  Involvement Pertinent to Current Situation/Hospitalization: No - Comment as needed  Activities of Daily Living   ADL Screening (condition at time of admission) Independently performs ADLs?: Yes (appropriate for developmental age) Is the patient deaf or have difficulty hearing?: Yes Does the patient have difficulty seeing, even when wearing glasses/contacts?: No Does the patient have difficulty concentrating, remembering, or making decisions?: Yes  Permission Sought/Granted Permission sought to share information with : Case Manager Permission granted to share information with : Yes, Verbal Permission Granted  Share Information with NAME: Carolyn Henderson     Permission granted to share info w Relationship: duaghter  Permission granted to share info w Contact Information: [phone]  Emotional Assessment Appearance:: Appears stated age Attitude/Demeanor/Rapport: Engaged Affect (typically observed): Appropriate Orientation: : Oriented to Self, Oriented to Place, Oriented to  Time, Oriented to Situation Alcohol  / Substance Use: Not Applicable Psych Involvement: No (comment)  Admission diagnosis:  Acute UTI (urinary tract infection) [N39.0] Patient Active Problem List   Diagnosis Date Noted   Acute UTI (urinary tract infection) 04/25/2024   Microcytosis 04/25/2024   Grade I diastolic dysfunction 04/25/2024   Iron deficiency anemia due to chronic blood loss 04/25/2024   Cobalamin deficiency 05/01/2022   Pruritic rash 03/06/2022   Antibiotic-associated diarrhea 01/08/2022   Expiratory wheezing 01/02/2022   Seasonal allergic rhinitis 01/02/2022   Seborrheic keratoses 12/27/2021   RLS (restless legs syndrome) 01/13/2015   Hyponatremia 10/10/2014   Sacral fracture, closed (HCC) 10/08/2014   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   Osteopenia  02/07/2012   CHRONIC LARYNGITIS 09/26/2010   DYSPRAXIA 05/04/2010   WEAKNESS 05/04/2010   Postherpetic neuralgia 02/17/2010   Herpes zoster  01/23/2010   ECZEMA 09/12/2009   Hypothyroidism 03/10/2009   Osteoarthritis 03/10/2009   Hyperlipidemia 03/04/2009   Essential hypertension 03/04/2009   PCP:  Brutus Delon SAUNDERS, NP Pharmacy:   [facility] [address], [place] - [address] [place] [address] Phone: [phone] Fax: [phone]  [facility] [address] [place] [address] Phone: [phone] Fax: [phone]  [id number] [place], [place] - [address] [place] [address] Phone: [phone] Fax: [phone]     Social Drivers of Health (SDOH) Social History: SDOH Screenings   Food Insecurity: No Food Insecurity (04/25/2024)  Housing: Low Risk  (04/25/2024)  Transportation Needs: No Transportation Needs (04/25/2024)  Utilities: Not At Risk (04/25/2024)  Financial Resource Strain: Low Risk  (10/31/2023)   Received from [facility]  Physical Activity: Unknown (08/12/2023)   Received from [facility]  Recent Concern: Physical Activity - Inactive (08/12/2023)   Received from [facility]  Social Connections: Moderately Integrated (04/25/2024)  Stress: No Stress Concern Present (08/12/2023)   Received from [facility]  Tobacco Use: Medium Risk (04/25/2024)   SDOH Interventions:     Readmission Risk Interventions    04/27/2024   10:20 AM  Readmission Risk Prevention Plan  Post Dischage Appt Complete  Medication Screening Complete  Transportation Screening Complete

## 2024-04-27 NOTE — Significant Event (Signed)
 Rapid Response Event Note   Reason for Call : Change in mental mentation.   Initial Focused Assessment:  Difficult to awaken, does respond to verbal and painful stimuli  vital signs: BP 136/74, HR 64, Oxygen 98 on room air, glucose 94 Primary nurse reports that patient has been up in the chair most of the day.    Interventions:  Continue to Monitor   Plan of Care:  Notified Dr. Sonjia situation noted and no new orders at this time  Primary nurse will continue to monitor and inform provider if vitals change.     MD Notified:  Dr. Sonjia Call Time: 1830 Arrival Time: 1835 End Time: 1845   Heron PARAS Maniah Nading, RN

## 2024-04-28 DIAGNOSIS — N390 Urinary tract infection, site not specified: Secondary | ICD-10-CM | POA: Diagnosis not present

## 2024-04-28 LAB — URINE CULTURE: Culture: >=100000 — AB

## 2024-04-28 MED ORDER — ONDANSETRON 4 MG PO TBDP: ORAL_TABLET | ORAL | 0 refills | Status: DC

## 2024-04-28 MED ORDER — CIPROFLOXACIN HCL 500 MG PO TABS: 500.0000 mg | ORAL_TABLET | Freq: Two times a day (BID) | ORAL | 0 refills | Status: AC

## 2024-04-28 NOTE — Progress Notes (Deleted)
 Physician Discharge Summary  Carolyn Henderson FMW:[id number] DOB: 11/22/1929 DOA: 04/25/2024  PCP: Brutus Delon SAUNDERS, NP  Admit date: 04/25/2024 Discharge date: 04/28/2024  Admitted From: Home  Discharge disposition: Home with home health   Recommendations for Outpatient Follow-Up:   Follow up with your primary care provider in one week.  Check CBC, BMP, magnesium  in the next visit   Discharge Diagnosis:   Principal Problem:   Acute UTI (urinary tract infection) Active Problems:   Postherpetic neuralgia   Hypothyroidism   Hyperlipidemia   Essential hypertension   Microcytosis   Cobalamin deficiency   Grade I diastolic dysfunction   Iron deficiency anemia due to chronic blood loss   Discharge Condition: Improved.  Diet recommendation: Low sodium, heart healthy.   Wound care: None.  Code status: Full.   History of Present Illness:   Carolyn Henderson is a 89 y.o. female with past medical history significant of arthritis, diverticulosis, herpes zoster, hyperlipidemia, hypertension, osteopenia, hypothyroidism presented to the hospital from independent living facility with complaints of urinary frequency and mild suprapubic tenderness for 3 days.  In the ED, patient had stable vitals.  Labs were notable for no leukocytosis but UA had positive nitrites, trace leukocyte esterase, 11-20 WBC and rare bacteria.  Fecal occult blood was negative here, but was recently positive at her nursing facility.  hemoglobin 10.1 g/dL with an MCV of 21.7 fL and platelets 490.  She recently had a hemoglobin level of 7.5 g/dL at the nursing facility.   Her two most recent urine cultures show Klebsiella sensitive to ciprofloxacin .  Patient received ciprofloxacin  and pantoprazole  in the ED and was admitted hospital for further evaluation and treatment.   Hospital Course:   Following conditions were addressed during hospitalization as listed below,  Acute Klebsiella and E. coli UTI (urinary tract  infection) Sensitive to Cipro .  Will continue Cipro  for next 3 days to complete total 5-day course.  Patient has difficulty tolerating many other antibiotics.  patient is clinically improved.     Microcytosis secondary to iron deficiency anemia due to chronic blood loss  Takes iron as outpatient.  Will continue on discharge.     Postherpetic neuralgia Continue gabapentin      Hypothyroidism Continue Synthroid      Hyperlipidemia Continue lifestyle modification     Essential hypertension Continue amlodipine      Grade I diastolic dysfunction Appears compensated.  No signs of heart failure   Vitamin B12 deficiency. Continue vitamin B12 supplementation.      Debility weakness frailty.  Patient is currently at the independent living facility.  PT has recommended home health PT on discharge.   Disposition.  At this time, patient is stable for disposition home with home health  Medical Consultants:   None.  Procedures:    None Subjective:   Today, patient was seen and examined at bedside.  Feels better.  Denies any nausea vomiting fever chills or rigor.  Discharge Exam:   Vitals:   04/28/24 0457 04/28/24 0831  BP: (!) 132/58   Pulse: 71   Resp: 17   Temp: 97.7 F (36.5 C)   SpO2: 96% 95%   Vitals:   04/27/24 1813 04/27/24 2004 04/28/24 0457 04/28/24 0831  BP: 136/74 (!) 127/53 (!) 132/58   Pulse: 65 88 71   Resp:  20 17   Temp:  (!) 97.4 F (36.3 C) 97.7 F (36.5 C)   TempSrc:      SpO2: 99% 97% 96% 95%  Weight:  Height:       Body mass index is 24.72 kg/m.  General: Alert awake, not in obvious distress, elderly female, thinly built. HENT: pupils equally reacting to light,  No scleral pallor or icterus noted. Oral mucosa is moist.  Chest:  Clear breath sounds.  No crackles or wheezes.  CVS: S1 &S2 heard. No murmur.  Regular rate and rhythm. Abdomen: Soft, nontender, nondistended.  Bowel sounds are heard.   Extremities: No cyanosis, clubbing or edema.   Peripheral pulses are palpable. Psych: Alert, awake and oriented, normal mood CNS:  No cranial nerve deficits.  Power equal in all extremities.  Generalized weakness noted Skin: Warm and dry.  No rashes noted.  The results of significant diagnostics from this hospitalization (including imaging, microbiology, ancillary and laboratory) are listed below for reference.     Diagnostic Studies:   No results found.   Labs:   Basic Metabolic Panel: Recent Labs  Lab 04/25/24 1450 04/26/24 0510  NA 136 133*  K 3.8 3.6  CL 101 99  CO2 25 23  GLUCOSE 94 119*  BUN 13 12  CREATININE 0.39* 0.40*  CALCIUM  9.2 8.4*   GFR Estimated Creatinine Clearance: 38.7 mL/min (A) (by C-G formula based on SCr of 0.4 mg/dL (L)). Liver Function Tests: Recent Labs  Lab 04/25/24 1450 04/26/24 0510  AST 25 18  ALT 10 9  ALKPHOS 68 48  BILITOT 0.5 0.6  PROT 8.0 6.0*  ALBUMIN 3.7 2.9*   No results for input(s): LIPASE, AMYLASE in the last 168 hours. No results for input(s): AMMONIA in the last 168 hours. Coagulation profile No results for input(s): INR, PROTIME in the last 168 hours.  CBC: Recent Labs  Lab 04/25/24 1450 04/26/24 0510 04/27/24 1028  WBC 7.6 5.4 8.1  HGB 10.1* 8.3* 8.8*  HCT 34.4* 28.6* 31.2*  MCV 78.2* 77.9* 80.4  PLT 490* 429* 414*   Cardiac Enzymes: No results for input(s): CKTOTAL, CKMB, CKMBINDEX, TROPONINI in the last 168 hours. BNP: Invalid input(s): POCBNP CBG: Recent Labs  Lab 04/27/24 1804  GLUCAP 94   D-Dimer No results for input(s): DDIMER in the last 72 hours. Hgb A1c No results for input(s): HGBA1C in the last 72 hours. Lipid Profile No results for input(s): CHOL, HDL, LDLCALC, TRIG, CHOLHDL, LDLDIRECT in the last 72 hours. Thyroid  function studies No results for input(s): TSH, T4TOTAL, T3FREE, THYROIDAB in the last 72 hours.  Invalid input(s): FREET3 Anemia work up Recent Labs    04/26/24 1055   VITAMINB12 450  FOLATE 11.9  FERRITIN 43  TIBC 388  IRON 17*  RETICCTPCT 2.3   Microbiology Recent Results (from the past 240 hours)  Urine Culture     Status: Abnormal   Collection Time: 04/25/24  2:30 PM   Specimen: Urine, Clean Catch  Result Value Ref Range Status   Specimen Description   Final    URINE, CLEAN CATCH Performed at [facility], [address], [place], [place] [address]    Special Requests   Final    NONE Performed at [facility], [address] [place], [place] [address]    Culture (A)  Final    >=100,000 COLONIES/mL KLEBSIELLA PNEUMONIAE 10,000 COLONIES/mL ESCHERICHIA COLI    Report Status 04/28/2024 FINAL  Final   Organism ID, Bacteria ESCHERICHIA COLI (A)  Final   Organism ID, Bacteria KLEBSIELLA PNEUMONIAE (A)  Final      Susceptibility   Escherichia coli - MIC*    AMPICILLIN  4 SENSITIVE Sensitive     CEFAZOLIN <=4 SENSITIVE Sensitive     CEFEPIME <=0.12 SENSITIVE Sensitive     CEFTRIAXONE <=0.25 SENSITIVE Sensitive     CIPROFLOXACIN  <=0.25 SENSITIVE Sensitive     GENTAMICIN <=1 SENSITIVE Sensitive     IMIPENEM <=0.25 SENSITIVE Sensitive     NITROFURANTOIN <=16 SENSITIVE Sensitive     TRIMETH/SULFA <=20 SENSITIVE Sensitive     AMPICILLIN/SULBACTAM <=2 SENSITIVE Sensitive     PIP/TAZO <=4 SENSITIVE Sensitive ug/mL    * 10,000 COLONIES/mL ESCHERICHIA COLI   Klebsiella pneumoniae - MIC*    AMPICILLIN >=32 RESISTANT Resistant     CEFAZOLIN <=4 SENSITIVE Sensitive     CEFEPIME <=0.12 SENSITIVE Sensitive     CEFTRIAXONE <=0.25 SENSITIVE Sensitive     CIPROFLOXACIN  <=0.25 SENSITIVE Sensitive     GENTAMICIN <=1 SENSITIVE Sensitive     IMIPENEM <=0.25 SENSITIVE Sensitive     NITROFURANTOIN 64 INTERMEDIATE Intermediate     TRIMETH/SULFA <=20 SENSITIVE Sensitive     AMPICILLIN/SULBACTAM 4 SENSITIVE Sensitive     PIP/TAZO <=4 SENSITIVE Sensitive ug/mL    * >=100,000 COLONIES/mL KLEBSIELLA PNEUMONIAE      Discharge Instructions:   Discharge Instructions     Call MD for:  persistant nausea and vomiting   Complete by: As directed    Call MD for:  severe uncontrolled pain   Complete by: As directed    Call MD for:  temperature >100.4   Complete by: As directed    Diet - low sodium heart healthy   Complete by: As directed    Discharge instructions   Complete by: As directed    Complete course of antibiotic.  Increase fluid intake.  Follow-up with your primary care provider in 1 week.  Check blood work at that time.  Seek medical attention for worsening symptoms.   Increase activity slowly   Complete by: As directed       Allergies as of 04/28/2024       Reactions   Cephalosporins    Unknown reaction    Codeine Sulfate Nausea And Vomiting   Hydrocodone  Itching, Other (See Comments)   Percocet [oxycodone -acetaminophen ] Nausea And Vomiting   Tramadol  Nausea Only   Penicillins Swelling, Rash   Has patient had a PCN reaction causing immediate rash, facial/tongue/throat swelling, SOB or lightheadedness with hypotension: yes Has patient had a PCN reaction causing severe rash involving mucus membranes or skin necrosis: no Has patient had a PCN reaction that required hospitalization: yes Has patient had a PCN reaction occurring within the last 10 years: no  If all of the above answers are NO, then may proceed with Cephalosporin use.        Medication List     STOP taking these medications    cetirizine 10 MG tablet Commonly known as: ZYRTEC   oxyCODONE -acetaminophen  5-325 MG tablet Commonly known as: PERCOCET/ROXICET       TAKE these medications    acetaminophen  325 MG tablet Commonly known as: TYLENOL  Take 2 tablets (650 mg total) by mouth every 6 (six) hours as needed for mild pain (or Fever >/= 101).   Acidophilus/Goat Milk Caps Take 1 capsule by mouth every evening.   albuterol  108 (90 Base) MCG/ACT inhaler Commonly known as: VENTOLIN  HFA Inhale 1 puff  into the lungs every 6 (six) hours as needed for wheezing or shortness of breath.   amLODipine  2.5 MG tablet Commonly known as: NORVASC  Take 1 tablet (2.5 mg total) by mouth daily.  benzonatate  100 MG capsule Commonly known as: TESSALON  Take 1 capsule (100 mg total) by mouth 3 (three) times daily as needed for cough.   budesonide-formoterol 80-4.5 MCG/ACT inhaler Commonly known as: SYMBICORT Inhale 1 puff into the lungs 2 (two) times daily.   CALCIUM  1200 PO Take 1 tablet by mouth in the morning and at bedtime.   cholecalciferol  1000 units tablet Commonly known as: VITAMIN D  Take 1,000 Units by mouth daily.   ciprofloxacin  500 MG tablet Commonly known as: CIPRO  Take 1 tablet (500 mg total) by mouth 2 (two) times daily for 3 days.   clotrimazole -betamethasone  cream Commonly known as: Lotrisone  Apply 1 application topically 2 (two) times daily. What changed:  when to take this reasons to take this   diphenhydrAMINE  25 MG tablet Commonly known as: BENADRYL  Take 25-50 mg by mouth every 6 (six) hours as needed for itching or allergies.   diphenhydramine -acetaminophen  25-500 MG Tabs tablet Commonly known as: TYLENOL  PM Take 1 tablet by mouth at bedtime.   ferrous sulfate  324 (65 Fe) MG Tbec Take 324 mg by mouth daily.   furosemide  20 MG tablet Commonly known as: LASIX  Take 10 mg by mouth daily.   gabapentin  300 MG capsule Commonly known as: NEURONTIN  TAKE 1 TO 3 CAPSULES BY MOUTH EVERY DAY AS DIRECTED What changed:  how much to take how to take this when to take this additional instructions   GENTEAL OP Apply 1 drop to eye at bedtime.   ibuprofen  400 MG tablet Commonly known as: ADVIL  Take 1 tablet (400 mg total) by mouth every 6 (six) hours as needed for up to 20 doses. What changed: reasons to take this   loratadine  10 MG tablet Commonly known as: CLARITIN  Take 10 mg by mouth daily.   ondansetron  4 MG disintegrating tablet Commonly known as: Zofran   ODT 4mg  ODT q4 hours prn nausea/vomit What changed: Another medication with the same name was removed. Continue taking this medication, and follow the directions you see here.   PreserVision AREDS 2 Caps Take 1 capsule by mouth 2 (two) times daily.   rOPINIRole  0.25 MG tablet Commonly known as: REQUIP  Take 1 tablet by mouth at  bedtime   Synthroid  112 MCG tablet Generic drug: levothyroxine  Take 112 mcg by mouth daily before breakfast. What changed: Another medication with the same name was removed. Continue taking this medication, and follow the directions you see here.   SYSTANE OP Apply 1 drop to eye 3 (three) times daily as needed (for dry eyes).   VITAMIN B1-B12 IM Inject 1 Dose into the muscle every 30 (thirty) days.        Follow-up Information     Brutus Delon SAUNDERS, NP Follow up in 1 week(s).   Specialty: Adult Health Nurse Practitioner Contact information: [address] [place] [address] [phone]                  Time coordinating discharge: 39 minutes  Signed:  Lakoda Raske  [facility] 04/28/2024, 3:33 PM

## 2024-04-28 NOTE — Plan of Care (Signed)

## 2024-04-28 NOTE — Plan of Care (Signed)
  Problem: Education: Goal: Knowledge of General Education information will improve Description: Including pain rating scale, medication(s)/side effects and non-pharmacologic comfort measures Outcome: Adequate for Discharge   Problem: Health Behavior/Discharge Planning: Goal: Ability to manage health-related needs will improve Outcome: Adequate for Discharge   Problem: Clinical Measurements: Goal: Ability to maintain clinical measurements within normal limits will improve Outcome: Adequate for Discharge Goal: Will remain free from infection Outcome: Adequate for Discharge Goal: Diagnostic test results will improve Outcome: Adequate for Discharge Goal: Respiratory complications will improve Outcome: Adequate for Discharge Goal: Cardiovascular complication will be avoided Outcome: Adequate for Discharge   Problem: Activity: Goal: Risk for activity intolerance will decrease Outcome: Adequate for Discharge   Problem: Nutrition: Goal: Adequate nutrition will be maintained Outcome: Adequate for Discharge   Problem: Coping: Goal: Level of anxiety will decrease Outcome: Adequate for Discharge   Problem: Elimination: Goal: Will not experience complications related to bowel motility Outcome: Adequate for Discharge Goal: Will not experience complications related to urinary retention Outcome: Adequate for Discharge   Problem: Pain Managment: Goal: General experience of comfort will improve and/or be controlled Outcome: Adequate for Discharge   Problem: Safety: Goal: Ability to remain free from injury will improve Outcome: Adequate for Discharge   Problem: Safety: Goal: Ability to remain free from injury will improve Outcome: Adequate for Discharge   Problem: Skin Integrity: Goal: Risk for impaired skin integrity will decrease Outcome: Adequate for Discharge

## 2024-04-28 NOTE — Discharge Summary (Signed)
 Physician Discharge Summary  Carolyn Henderson FMW:[id number] DOB: 1929/10/18 DOA: 04/25/2024  PCP: Brutus Delon SAUNDERS, NP  Admit date: 04/25/2024 Discharge date: 04/28/2024  Admitted From: Home  Discharge disposition: Home with home health   Recommendations for Outpatient Follow-Up:   Follow up with your primary care provider in one week.  Check CBC, BMP, magnesium  in the next visit   Discharge Diagnosis:   Principal Problem:   Acute UTI (urinary tract infection) Active Problems:   Postherpetic neuralgia   Hypothyroidism   Hyperlipidemia   Essential hypertension   Microcytosis   Cobalamin deficiency   Grade I diastolic dysfunction   Iron deficiency anemia due to chronic blood loss   Discharge Condition: Improved.  Diet recommendation: Low sodium, heart healthy.   Wound care: None.  Code status: Full.   History of Present Illness:   Carolyn Henderson is a 89 y.o. female with past medical history significant of arthritis, diverticulosis, herpes zoster, hyperlipidemia, hypertension, osteopenia, hypothyroidism presented to the hospital from independent living facility with complaints of urinary frequency and mild suprapubic tenderness for 3 days.  In the ED, patient had stable vitals.  Labs were notable for no leukocytosis but UA had positive nitrites, trace leukocyte esterase, 11-20 WBC and rare bacteria.  Fecal occult blood was negative here, but was recently positive at her nursing facility.  hemoglobin 10.1 g/dL with an MCV of 21.7 fL and platelets 490.  She recently had a hemoglobin level of 7.5 g/dL at the nursing facility.   Her two most recent urine cultures show Klebsiella sensitive to ciprofloxacin .  Patient received ciprofloxacin  and pantoprazole  in the ED and was admitted hospital for further evaluation and treatment.   Hospital Course:   Following conditions were addressed during hospitalization as listed below,  Acute Klebsiella and E. coli UTI (urinary tract  infection) Sensitive to Cipro .  Will continue Cipro  for next 3 days to complete total 5-day course.  Patient has difficulty tolerating many other antibiotics.  patient is clinically improved.     Microcytosis secondary to iron deficiency anemia due to chronic blood loss  Takes iron as outpatient.  Will continue on discharge.     Postherpetic neuralgia Continue gabapentin      Hypothyroidism Continue Synthroid      Hyperlipidemia Continue lifestyle modification     Essential hypertension Continue amlodipine      Grade I diastolic dysfunction Appears compensated.  No signs of heart failure   Vitamin B12 deficiency. Continue vitamin B12 supplementation.      Debility weakness frailty.  Patient is currently at the independent living facility.  PT has recommended home health PT on discharge.   Disposition.  At this time, patient is stable for disposition home with home health  Medical Consultants:   None.  Procedures:    None Subjective:   Today, patient was seen and examined at bedside.  Feels better.  Denies any nausea vomiting fever chills or rigor.  Discharge Exam:   Vitals:   04/28/24 0457 04/28/24 0831  BP: (!) 132/58   Pulse: 71   Resp: 17   Temp: 97.7 F (36.5 C)   SpO2: 96% 95%   Vitals:   04/27/24 1813 04/27/24 2004 04/28/24 0457 04/28/24 0831  BP: 136/74 (!) 127/53 (!) 132/58   Pulse: 65 88 71   Resp:  20 17   Temp:  (!) 97.4 F (36.3 C) 97.7 F (36.5 C)   TempSrc:      SpO2: 99% 97% 96% 95%  Weight:  Height:       Body mass index is 24.72 kg/m.  General: Alert awake, not in obvious distress, elderly female, thinly built. HENT: pupils equally reacting to light,  No scleral pallor or icterus noted. Oral mucosa is moist.  Chest:  Clear breath sounds.  No crackles or wheezes.  CVS: S1 &S2 heard. No murmur.  Regular rate and rhythm. Abdomen: Soft, nontender, nondistended.  Bowel sounds are heard.   Extremities: No cyanosis, clubbing or edema.   Peripheral pulses are palpable. Psych: Alert, awake and oriented, normal mood CNS:  No cranial nerve deficits.  Power equal in all extremities.  Generalized weakness noted Skin: Warm and dry.  No rashes noted.  The results of significant diagnostics from this hospitalization (including imaging, microbiology, ancillary and laboratory) are listed below for reference.     Diagnostic Studies:   No results found.   Labs:   Basic Metabolic Panel: Recent Labs  Lab 04/25/24 1450 04/26/24 0510  NA 136 133*  K 3.8 3.6  CL 101 99  CO2 25 23  GLUCOSE 94 119*  BUN 13 12  CREATININE 0.39* 0.40*  CALCIUM  9.2 8.4*   GFR Estimated Creatinine Clearance: 38.7 mL/min (A) (by C-G formula based on SCr of 0.4 mg/dL (L)). Liver Function Tests: Recent Labs  Lab 04/25/24 1450 04/26/24 0510  AST 25 18  ALT 10 9  ALKPHOS 68 48  BILITOT 0.5 0.6  PROT 8.0 6.0*  ALBUMIN 3.7 2.9*   No results for input(s): LIPASE, AMYLASE in the last 168 hours. No results for input(s): AMMONIA in the last 168 hours. Coagulation profile No results for input(s): INR, PROTIME in the last 168 hours.  CBC: Recent Labs  Lab 04/25/24 1450 04/26/24 0510 04/27/24 1028  WBC 7.6 5.4 8.1  HGB 10.1* 8.3* 8.8*  HCT 34.4* 28.6* 31.2*  MCV 78.2* 77.9* 80.4  PLT 490* 429* 414*   Cardiac Enzymes: No results for input(s): CKTOTAL, CKMB, CKMBINDEX, TROPONINI in the last 168 hours. BNP: Invalid input(s): POCBNP CBG: Recent Labs  Lab 04/27/24 1804  GLUCAP 94   D-Dimer No results for input(s): DDIMER in the last 72 hours. Hgb A1c No results for input(s): HGBA1C in the last 72 hours. Lipid Profile No results for input(s): CHOL, HDL, LDLCALC, TRIG, CHOLHDL, LDLDIRECT in the last 72 hours. Thyroid  function studies No results for input(s): TSH, T4TOTAL, T3FREE, THYROIDAB in the last 72 hours.  Invalid input(s): FREET3 Anemia work up Recent Labs    04/26/24 1055   VITAMINB12 450  FOLATE 11.9  FERRITIN 43  TIBC 388  IRON 17*  RETICCTPCT 2.3   Microbiology Recent Results (from the past 240 hours)  Urine Culture     Status: Abnormal   Collection Time: 04/25/24  2:30 PM   Specimen: Urine, Clean Catch  Result Value Ref Range Status   Specimen Description   Final    URINE, CLEAN CATCH Performed at [facility], [address], [place], [place] [address]    Special Requests   Final    NONE Performed at [facility], [address] [place], [place] [address]    Culture (A)  Final    >=100,000 COLONIES/mL KLEBSIELLA PNEUMONIAE 10,000 COLONIES/mL ESCHERICHIA COLI    Report Status 04/28/2024 FINAL  Final   Organism ID, Bacteria ESCHERICHIA COLI (A)  Final   Organism ID, Bacteria KLEBSIELLA PNEUMONIAE (A)  Final      Susceptibility   Escherichia coli - MIC*    AMPICILLIN  4 SENSITIVE Sensitive     CEFAZOLIN <=4 SENSITIVE Sensitive     CEFEPIME <=0.12 SENSITIVE Sensitive     CEFTRIAXONE <=0.25 SENSITIVE Sensitive     CIPROFLOXACIN  <=0.25 SENSITIVE Sensitive     GENTAMICIN <=1 SENSITIVE Sensitive     IMIPENEM <=0.25 SENSITIVE Sensitive     NITROFURANTOIN <=16 SENSITIVE Sensitive     TRIMETH/SULFA <=20 SENSITIVE Sensitive     AMPICILLIN/SULBACTAM <=2 SENSITIVE Sensitive     PIP/TAZO <=4 SENSITIVE Sensitive ug/mL    * 10,000 COLONIES/mL ESCHERICHIA COLI   Klebsiella pneumoniae - MIC*    AMPICILLIN >=32 RESISTANT Resistant     CEFAZOLIN <=4 SENSITIVE Sensitive     CEFEPIME <=0.12 SENSITIVE Sensitive     CEFTRIAXONE <=0.25 SENSITIVE Sensitive     CIPROFLOXACIN  <=0.25 SENSITIVE Sensitive     GENTAMICIN <=1 SENSITIVE Sensitive     IMIPENEM <=0.25 SENSITIVE Sensitive     NITROFURANTOIN 64 INTERMEDIATE Intermediate     TRIMETH/SULFA <=20 SENSITIVE Sensitive     AMPICILLIN/SULBACTAM 4 SENSITIVE Sensitive     PIP/TAZO <=4 SENSITIVE Sensitive ug/mL    * >=100,000 COLONIES/mL KLEBSIELLA PNEUMONIAE      Discharge Instructions:   Discharge Instructions     Call MD for:  persistant nausea and vomiting   Complete by: As directed    Call MD for:  severe uncontrolled pain   Complete by: As directed    Call MD for:  temperature >100.4   Complete by: As directed    Diet - low sodium heart healthy   Complete by: As directed    Discharge instructions   Complete by: As directed    Complete course of antibiotic.  Increase fluid intake.  Follow-up with your primary care provider in 1 week.  Check blood work at that time.  Seek medical attention for worsening symptoms.   Increase activity slowly   Complete by: As directed       Allergies as of 04/28/2024       Reactions   Cephalosporins    Unknown reaction    Codeine Sulfate Nausea And Vomiting   Hydrocodone  Itching, Other (See Comments)   Percocet [oxycodone -acetaminophen ] Nausea And Vomiting   Tramadol  Nausea Only   Penicillins Swelling, Rash   Has patient had a PCN reaction causing immediate rash, facial/tongue/throat swelling, SOB or lightheadedness with hypotension: yes Has patient had a PCN reaction causing severe rash involving mucus membranes or skin necrosis: no Has patient had a PCN reaction that required hospitalization: yes Has patient had a PCN reaction occurring within the last 10 years: no  If all of the above answers are NO, then may proceed with Cephalosporin use.        Medication List     STOP taking these medications    cetirizine 10 MG tablet Commonly known as: ZYRTEC   oxyCODONE -acetaminophen  5-325 MG tablet Commonly known as: PERCOCET/ROXICET       TAKE these medications    acetaminophen  325 MG tablet Commonly known as: TYLENOL  Take 2 tablets (650 mg total) by mouth every 6 (six) hours as needed for mild pain (or Fever >/= 101).   Acidophilus/Goat Milk Caps Take 1 capsule by mouth every evening.   albuterol  108 (90 Base) MCG/ACT inhaler Commonly known as: VENTOLIN  HFA Inhale 1 puff  into the lungs every 6 (six) hours as needed for wheezing or shortness of breath.   amLODipine  2.5 MG tablet Commonly known as: NORVASC  Take 1 tablet (2.5 mg total) by mouth daily.  benzonatate  100 MG capsule Commonly known as: TESSALON  Take 1 capsule (100 mg total) by mouth 3 (three) times daily as needed for cough.   budesonide-formoterol 80-4.5 MCG/ACT inhaler Commonly known as: SYMBICORT Inhale 1 puff into the lungs 2 (two) times daily.   CALCIUM  1200 PO Take 1 tablet by mouth in the morning and at bedtime.   cholecalciferol  1000 units tablet Commonly known as: VITAMIN D  Take 1,000 Units by mouth daily.   ciprofloxacin  500 MG tablet Commonly known as: CIPRO  Take 1 tablet (500 mg total) by mouth 2 (two) times daily for 3 days.   clotrimazole -betamethasone  cream Commonly known as: Lotrisone  Apply 1 application topically 2 (two) times daily. What changed:  when to take this reasons to take this   diphenhydrAMINE  25 MG tablet Commonly known as: BENADRYL  Take 25-50 mg by mouth every 6 (six) hours as needed for itching or allergies.   diphenhydramine -acetaminophen  25-500 MG Tabs tablet Commonly known as: TYLENOL  PM Take 1 tablet by mouth at bedtime.   ferrous sulfate  324 (65 Fe) MG Tbec Take 324 mg by mouth daily.   furosemide  20 MG tablet Commonly known as: LASIX  Take 10 mg by mouth daily.   gabapentin  300 MG capsule Commonly known as: NEURONTIN  TAKE 1 TO 3 CAPSULES BY MOUTH EVERY DAY AS DIRECTED What changed:  how much to take how to take this when to take this additional instructions   GENTEAL OP Apply 1 drop to eye at bedtime.   ibuprofen  400 MG tablet Commonly known as: ADVIL  Take 1 tablet (400 mg total) by mouth every 6 (six) hours as needed for up to 20 doses. What changed: reasons to take this   loratadine  10 MG tablet Commonly known as: CLARITIN  Take 10 mg by mouth daily.   ondansetron  4 MG disintegrating tablet Commonly known as: Zofran   ODT 4mg  ODT q4 hours prn nausea/vomit What changed: Another medication with the same name was removed. Continue taking this medication, and follow the directions you see here.   PreserVision AREDS 2 Caps Take 1 capsule by mouth 2 (two) times daily.   rOPINIRole  0.25 MG tablet Commonly known as: REQUIP  Take 1 tablet by mouth at  bedtime   Synthroid  112 MCG tablet Generic drug: levothyroxine  Take 112 mcg by mouth daily before breakfast. What changed: Another medication with the same name was removed. Continue taking this medication, and follow the directions you see here.   SYSTANE OP Apply 1 drop to eye 3 (three) times daily as needed (for dry eyes).   VITAMIN B1-B12 IM Inject 1 Dose into the muscle every 30 (thirty) days.        Follow-up Information     Brutus Delon SAUNDERS, NP Follow up in 1 week(s).   Specialty: Adult Health Nurse Practitioner Contact information: [address] [place] [address] [phone]                  Time coordinating discharge: 39 minutes  Signed:  Shawna Wearing  [facility] 04/28/2024, 3:37 PM

## 2024-04-28 NOTE — TOC Transition Note (Signed)
 Transition of Care [facility]) - Discharge Note   Patient Details  Name: Carolyn Henderson MRN: [id number] Date of Birth: 1930/08/21  Transition of [facility]) CM/SW Contact:  Doneta Glenys DASEN, RN Phone Number: 04/28/2024, 10:06 AM   Clinical Narrative:    Patient is medically ready for discharge. CM has called [facility] [phone]) to inform about discharge and need to continue [facility] PT OT. Ronal states that's patient is current with [facility] for PT/OT Teddi Mares [phone]). CM called [facility] to inform of continued [facility] PT/OT order. TOC signing off. Please place consult if needs present.   Final next level of care:  (Independent Living with [facility] PT) Barriers to Discharge: Barriers Resolved   Patient Goals and CMS Choice Patient states their goals for this hospitalization and ongoing recovery are:: [facility] with spouse CMS Medicare.gov Compare Post Acute Care list provided to::  (NA)   [place] ownership interest in [facility].provided to:: Parent NA    Discharge Placement                       Discharge Plan and Services Additional resources added to the After Visit Summary for   In-house Referral: NA Discharge Planning Services: NA Post Acute Care Choice: NA          DME Arranged: N/A DME Agency: NA       HH Arranged: PT HH Agency: [facility] Date [facility] Agency Contacted: 04/28/24 Time HH Agency Contacted: 1000 Representative spoke with at [facility] Agency: Lauraine  Social Drivers of Health (SDOH) Interventions SDOH Screenings   Food Insecurity: No Food Insecurity (04/25/2024)  Housing: Low Risk  (04/25/2024)  Transportation Needs: No Transportation Needs (04/25/2024)  Utilities: Not At Risk (04/25/2024)  Financial Resource Strain: Low Risk  (10/31/2023)   Received from [facility]  Physical Activity: Unknown (08/12/2023)   Received from [facility]  Recent Concern: Physical Activity - Inactive  (08/12/2023)   Received from [facility]  Social Connections: Moderately Integrated (04/25/2024)  Stress: No Stress Concern Present (08/12/2023)   Received from [facility]  Tobacco Use: Medium Risk (04/25/2024)     Readmission Risk Interventions    04/27/2024   10:20 AM  Readmission Risk Prevention Plan  Post Dischage Appt Complete  Medication Screening Complete  Transportation Screening Complete

## 2024-06-12 ENCOUNTER — Emergency Department (HOSPITAL_COMMUNITY)

## 2024-06-12 ENCOUNTER — Encounter (HOSPITAL_COMMUNITY): Payer: Self-pay

## 2024-06-12 ENCOUNTER — Emergency Department (HOSPITAL_COMMUNITY)
Admission: EM | Admit: 2024-06-12 | Discharge: 2024-06-12 | Disposition: A | Discharge status: Home / Self Care | Attending: Emergency Medicine | Admitting: Emergency Medicine

## 2024-06-12 DIAGNOSIS — D649 Anemia, unspecified: Secondary | ICD-10-CM | POA: Diagnosis not present

## 2024-06-12 DIAGNOSIS — R531 Weakness: Secondary | ICD-10-CM | POA: Diagnosis not present

## 2024-06-12 DIAGNOSIS — Z79899 Other long term (current) drug therapy: Secondary | ICD-10-CM | POA: Insufficient documentation

## 2024-06-12 DIAGNOSIS — S0003XA Contusion of scalp, initial encounter: Secondary | ICD-10-CM | POA: Insufficient documentation

## 2024-06-12 DIAGNOSIS — R5383 Other fatigue: Secondary | ICD-10-CM | POA: Insufficient documentation

## 2024-06-12 DIAGNOSIS — Z7951 Long term (current) use of inhaled steroids: Secondary | ICD-10-CM | POA: Diagnosis not present

## 2024-06-12 DIAGNOSIS — W19XXXA Unspecified fall, initial encounter: Secondary | ICD-10-CM

## 2024-06-12 DIAGNOSIS — J449 Chronic obstructive pulmonary disease, unspecified: Secondary | ICD-10-CM | POA: Insufficient documentation

## 2024-06-12 DIAGNOSIS — S0990XA Unspecified injury of head, initial encounter: Secondary | ICD-10-CM | POA: Diagnosis present

## 2024-06-12 DIAGNOSIS — D72829 Elevated white blood cell count, unspecified: Secondary | ICD-10-CM | POA: Insufficient documentation

## 2024-06-12 DIAGNOSIS — M542 Cervicalgia: Secondary | ICD-10-CM | POA: Insufficient documentation

## 2024-06-12 DIAGNOSIS — I10 Essential (primary) hypertension: Secondary | ICD-10-CM | POA: Insufficient documentation

## 2024-06-12 DIAGNOSIS — W1830XA Fall on same level, unspecified, initial encounter: Secondary | ICD-10-CM | POA: Diagnosis not present

## 2024-06-12 DIAGNOSIS — M25521 Pain in right elbow: Secondary | ICD-10-CM | POA: Diagnosis not present

## 2024-06-12 DIAGNOSIS — N390 Urinary tract infection, site not specified: Secondary | ICD-10-CM

## 2024-06-12 LAB — URINALYSIS, ROUTINE W REFLEX MICROSCOPIC
Bilirubin Urine: NEGATIVE
Glucose, UA: NEGATIVE mg/dL
Hgb urine dipstick: NEGATIVE
Ketones, ur: NEGATIVE mg/dL
Nitrite: POSITIVE — AB
Protein, ur: NEGATIVE mg/dL
Specific Gravity, Urine: 1.012 (ref 1.005–1.030)
pH: 6 (ref 5.0–8.0)

## 2024-06-12 LAB — RESP PANEL BY RT-PCR (RSV, FLU A&B, COVID)  RVPGX2
Influenza A by PCR: NEGATIVE
Influenza B by PCR: NEGATIVE
Resp Syncytial Virus by PCR: NEGATIVE
SARS Coronavirus 2 by RT PCR: NEGATIVE

## 2024-06-12 LAB — BASIC METABOLIC PANEL WITH GFR
Anion gap: 11 (ref 5–15)
BUN: 17 mg/dL (ref 8–23)
CO2: 25 mmol/L (ref 22–32)
Calcium: 8.7 mg/dL — ABNORMAL LOW (ref 8.9–10.3)
Chloride: 102 mmol/L (ref 98–111)
Creatinine, Ser: 0.47 mg/dL (ref 0.44–1.00)
GFR, Estimated: >60 mL/min (ref >60)
Glucose, Bld: 83 mg/dL (ref 70–99)
Potassium: 4.1 mmol/L (ref 3.5–5.1)
Sodium: 138 mmol/L (ref 135–145)

## 2024-06-12 LAB — CBC
HCT: 34.5 % — ABNORMAL LOW (ref 36.0–46.0)
Hemoglobin: 10.2 g/dL — ABNORMAL LOW (ref 12.0–15.0)
MCH: 25.2 pg — ABNORMAL LOW (ref 26.0–34.0)
MCHC: 29.6 g/dL — ABNORMAL LOW (ref 30.0–36.0)
MCV: 85.2 fL (ref 80.0–100.0)
Platelets: 302 K/uL (ref 150–400)
RBC: 4.05 MIL/uL (ref 3.87–5.11)
RDW: 23.4 % — ABNORMAL HIGH (ref 11.5–15.5)
WBC: 11.1 K/uL — ABNORMAL HIGH (ref 4.0–10.5)
nRBC: 0 % (ref 0.0–0.2)

## 2024-06-12 MED ORDER — SODIUM CHLORIDE 0.9 % IV BOLUS
500.0000 mL | Freq: Once | INTRAVENOUS | Status: AC
  Administered 2024-06-12: 500 mL via INTRAVENOUS

## 2024-06-12 MED ORDER — ACETAMINOPHEN 325 MG PO TABS
650.0000 mg | ORAL_TABLET | Freq: Once | ORAL | Status: AC
  Administered 2024-06-12: 650 mg via ORAL
  Filled 2024-06-12: qty 2

## 2024-06-12 MED ORDER — CIPROFLOXACIN HCL 500 MG PO TABS
500.0000 mg | ORAL_TABLET | Freq: Two times a day (BID) | ORAL | 0 refills | Status: AC
Start: 2024-06-12 — End: 2024-06-19

## 2024-06-12 MED ORDER — SODIUM CHLORIDE 0.9 % IV SOLN
1.0000 g | Freq: Once | INTRAVENOUS | Status: AC
  Administered 2024-06-12: 1 g via INTRAVENOUS
  Filled 2024-06-12: qty 10

## 2024-06-12 MED ORDER — IBUPROFEN 200 MG PO TABS
600.0000 mg | ORAL_TABLET | Freq: Once | ORAL | Status: AC
  Administered 2024-06-12: 600 mg via ORAL
  Filled 2024-06-12: qty 3

## 2024-06-12 MED ORDER — CIPROFLOXACIN HCL 500 MG PO TABS: 500.0000 mg | ORAL_TABLET | Freq: Two times a day (BID) | ORAL | 0 refills | Status: DC

## 2024-06-12 NOTE — ED Notes (Signed)
 Pt to Xray

## 2024-06-12 NOTE — ED Notes (Signed)
 Assisted pt to restroom about 15 mins ago she went but not in the hat that I placed in toilet was unable to obtain a sample

## 2024-06-12 NOTE — Discharge Instructions (Addendum)
 Thank you for letting us  evaluate you today.  Your CT imaging did not show any bleeding in your brain.  You do have a UTI.  We have given you an IV dose of Rocephin  and fluids here Emergency Department to start infection treatment.  Have also sent Bactrim to your pharmacy for antibiotic for UTI.  Please follow-up with primary care provider for recurrent UTIs  Return to Emergency Department for experience altered mentation, worsening symptoms

## 2024-06-12 NOTE — ED Triage Notes (Signed)
 BIB [facility] from home had a fall this morning, weak and fatigued, abrasion on top of head and RLE. A&Ox 4, lives independently. 99.8T, HR 84, BP 127/63, 92% on RA, hx of COPD. 122 CBG. 200cc LR given by EMS.

## 2024-06-12 NOTE — ED Provider Notes (Signed)
 Received patient in signout from previous provider pending UA, completion of labs.  See his note.  In short, patient presents to emergency department for evaluation of generalized weakness, head injury from a fall this morning.  Sustained abrasion to top of head and bruising to right side of scalp.  ED workup notable for mild leukocytosis of 11.1, Hgb of 10.2, UA positive for infection.  CT head, cervical spine, right elbow all negative for acute injury.  Was provided Tylenol , ibuprofen  for pain as well as IV Rocephin , IVF for hydration and UTI.  Patient reports feeling well improved and wanting to be discharged.  Had extensive conversation with patient's family regarding discharge and they are comfortable with her being sent home at this time.  I will send home with Cipro  as she reports this is the only antibiotic that does not upset my stomach and that I can tolerate.  She has had Cipro  several times in the past without complication.  Urine culture previously grew Klebsiella and E. coli which is susceptible to Cipro   Discussed ED workup, disposition, return to ED precautions with patient who expresses understanding agrees with plan.  All questions answered to their satisfaction.  They are agreeable to plan.  Discharge instructions provided on paperwork   Minnie Tinnie BRAVO, PA 06/12/24 2101    Bari Roxie HERO, DO 06/13/24 0016

## 2024-06-12 NOTE — ED Provider Notes (Signed)
 [place] [facility] AT [facility] Provider Note   CSN: [id number] Arrival date & time: 06/12/24  1002     Patient presents with: [name]   Carolyn Henderson is a 89 y.o. female with past medical history seen for hypertension, hyperlipidemia, arthritis, GERD, osteopenia who presents after a fall this morning.  Weak, fatigued compared to her baseline.  Abrasion noted to top of head, bruising on right side of scalp.  Lives independently, she reports feeling weak when she got up this morning, she denies feeling lightheaded, dizzy, denies chest pain, shortness of breath, she reports probable mechanical fall.    Fall       Prior to Admission medications   Medication Sig Start Date End Date Taking? Authorizing Provider  acetaminophen  (TYLENOL ) 325 MG tablet Take 2 tablets (650 mg total) by mouth every 6 (six) hours as needed for mild pain (or Fever >/= 101). 08/05/14   Danella Cough, PA-C  albuterol  (VENTOLIN  HFA) 108 (90 Base) MCG/ACT inhaler Inhale 1 puff into the lungs every 6 (six) hours as needed for wheezing or shortness of breath.    [provider]  amLODipine  (NORVASC ) 2.5 MG tablet Take 1 tablet (2.5 mg total) by mouth daily. 10/21/15   Burchette, Wolm ORN, MD  benzonatate  (TESSALON ) 100 MG capsule Take 1 capsule (100 mg total) by mouth 3 (three) times daily as needed for cough. 01/17/16   Burchette, Wolm ORN, MD  budesonide-formoterol (SYMBICORT) 80-4.5 MCG/ACT inhaler Inhale 1 puff into the lungs 2 (two) times daily.    [provider]  Calcium  Carbonate-Vit D-Min (CALCIUM  1200 PO) Take 1 tablet by mouth in the morning and at bedtime.    [provider]  Carboxymethylcell-Hypromellose (GENTEAL OP) Apply 1 drop to eye at bedtime.    [provider]  cholecalciferol  (VITAMIN D ) 1000 UNITS tablet Take 1,000 Units by mouth daily.    [provider]  clotrimazole -betamethasone  (LOTRISONE ) cream Apply 1 application topically 2  (two) times daily. Patient taking differently: Apply 1 application  topically 2 (two) times daily as needed (itching, eczema). 10/27/15   Burchette, Wolm ORN, MD  diphenhydrAMINE  (BENADRYL ) 25 MG tablet Take 25-50 mg by mouth every 6 (six) hours as needed for itching or allergies.     [provider]  diphenhydramine -acetaminophen  (TYLENOL  PM) 25-500 MG TABS tablet Take 1 tablet by mouth at bedtime.    [provider]  ferrous sulfate  324 (65 Fe) MG TBEC Take 324 mg by mouth daily. 04/16/16   [provider]  furosemide  (LASIX ) 20 MG tablet Take 10 mg by mouth daily.    [provider]  gabapentin  (NEURONTIN ) 300 MG capsule TAKE 1 TO 3 CAPSULES BY MOUTH EVERY DAY AS DIRECTED Patient taking differently: Take 300 mg by mouth 2 (two) times daily. 08/18/15   Krystal Reyes LABOR, MD  ibuprofen  (ADVIL ) 400 MG tablet Take 1 tablet (400 mg total) by mouth every 6 (six) hours as needed for up to 20 doses. Patient taking differently: Take 400 mg by mouth every 6 (six) hours as needed for mild pain (pain score 1-3). 03/12/20   Fawze, Mina A, PA-C  levothyroxine  (SYNTHROID ) 112 MCG tablet Take 112 mcg by mouth daily before breakfast.    [provider]  loratadine  (CLARITIN ) 10 MG tablet Take 10 mg by mouth daily.    [provider]  Multiple Vitamins-Minerals (PRESERVISION AREDS 2) CAPS Take 1 capsule by mouth 2 (two) times daily.     [provider]  ondansetron  (ZOFRAN  ODT) 4 MG disintegrating tablet 4mg  ODT q4 hours prn nausea/vomit 04/28/24   Pokhrel, Laxman, MD  Polyethyl Glycol-Propyl Glycol (SYSTANE OP) Apply 1 drop to eye 3 (three) times daily as needed (for dry eyes).     [provider]  Probiotic Product (ACIDOPHILUS/GOAT MILK) CAPS Take 1 capsule by mouth every evening.    [provider]  rOPINIRole  (REQUIP ) 0.25 MG tablet Take 1 tablet by mouth at  bedtime 03/19/16   Burchette, Wolm ORN, MD  VITAMIN B1-B12 IM Inject 1 Dose into  the muscle every 30 (thirty) days.    [provider]    Allergies: Cephalosporins, Codeine sulfate, Hydrocodone , Percocet [oxycodone -acetaminophen ], Tramadol , and Penicillins    Review of Systems  All other systems reviewed and are negative.   Updated Vital Signs BP (!) 145/69   Pulse 81   Temp 98.2 F (36.8 C) (Oral)   Resp 16   SpO2 99%   Physical Exam Vitals and nursing note reviewed.  Constitutional:      General: She is not in acute distress.    Appearance: Normal appearance.  HENT:     Head: Normocephalic and atraumatic.  Eyes:     General:        Right eye: No discharge.        Left eye: No discharge.  Cardiovascular:     Rate and Rhythm: Normal rate and regular rhythm.     Heart sounds: No murmur heard.    No friction rub. No gallop.  Pulmonary:     Effort: Pulmonary effort is normal.     Breath sounds: Normal breath sounds.  Abdominal:     General: Bowel sounds are normal.     Palpations: Abdomen is soft.  Musculoskeletal:     Comments: Bruising noted to right side of head, scalp, mild tenderness palpation of the cervical paraspinous muscles, no midline cervical, thoracic, or lumbar paraspinous tenderness.  Mild tenderness to palpation at right elbow with normal range of motion.  Slight global decrease strength, 4/5 bilateral lower extremities, 5/5 upper extremities.  Skin:    General: Skin is warm and dry.     Capillary Refill: Capillary refill takes less than 2 seconds.  Neurological:     Mental Status: She is alert and oriented to person, place, and time.  Psychiatric:        Mood and Affect: Mood normal.        Behavior: Behavior normal.     (all labs ordered are listed, but only abnormal results are displayed) Labs Reviewed  CBC - Abnormal; Notable for the following components:      Result Value   WBC 11.1 (*)    Hemoglobin 10.2 (*)    HCT 34.5 (*)    MCH 25.2 (*)    MCHC 29.6 (*)    RDW 23.4 (*)    All other components within  normal limits  RESP PANEL BY RT-PCR (RSV, FLU A&B, COVID)  [id number]  BASIC METABOLIC PANEL WITH GFR  URINALYSIS, ROUTINE W REFLEX MICROSCOPIC    EKG: None  Radiology: CT Head Wo Contrast Result Date: 06/12/2024 CLINICAL DATA:  Provided history: Head trauma, minor. Neck trauma. Additional history provided: Fall, abrasion at top of head. EXAM: CT HEAD WITHOUT CONTRAST CT CERVICAL SPINE WITHOUT CONTRAST TECHNIQUE: Multidetector CT imaging of the head and cervical spine was performed following the standard protocol without intravenous contrast. Multiplanar CT image reconstructions of the cervical spine were also generated. RADIATION  DOSE REDUCTION: This exam was performed according to the departmental dose-optimization program which includes automated exposure control, adjustment of the mA and/or kV according to patient size and/or use of iterative reconstruction technique. COMPARISON:  Head CT 12/25/2023. Cervical spine CT 12/25/2023. Brain MRI 05/06/2010. FINDINGS: CT HEAD FINDINGS Brain: Generalized cerebral atrophy. Chronic cyst within the right occipital lobe, unchanged and considered benign. Patchy and ill-defined hypoattenuation within the cerebral white matter, nonspecific but compatible with mild-to-moderate chronic small vessel ischemic disease. There is no acute intracranial hemorrhage. No demarcated cortical infarct. No extra-axial fluid collection. No evidence of an intracranial mass. No midline shift. Vascular: No hyperdense vessel.  Atherosclerotic calcifications. Skull: No calvarial fracture or aggressive osseous lesion. Sinuses/Orbits: No mass or acute finding within the imaged orbits. No significant paranasal sinus disease at the imaged levels. CT CERVICAL SPINE FINDINGS Alignment: Dextrocurvature of the cervical spine. 4 mm C4-C5 grade 1 anterolisthesis. 3 mm T2-T3 grade 1 anterolisthesis. Skull base and vertebrae: The basion-dental and atlanto-dental intervals are maintained.No evidence of  acute fracture to the cervical spine. Soft tissues and spinal canal: No prevertebral fluid or swelling. No visible canal hematoma. Disc levels: Cervical spondylosis with multilevel disc space narrowing, disc bulges/central disc protrusions, posterior disc osteophyte complexes, uncovertebral hypertrophy and facet arthropathy. Disc space narrowing is greatest at C5-C6 (advanced) and C6-C7 (moderate-to-advanced). No appreciable high-grade spinal canal stenosis. Multilevel bony neural foraminal narrowing. Degenerative changes also present at the C1-C2 articulation. Upper chest: No consolidation within the imaged lung apices. No visible pneumothorax. IMPRESSION: CT head: 1.  No evidence of an acute intracranial abnormality. 2. Parenchymal atrophy and chronic small vessel ischemic disease. CT cervical spine: 1. No evidence of an acute cervical spine fracture. 2. Grade 1 anterolisthesis at C4-C5 and T2-T3. 3. Dextrocurvature of the cervical spine. 4. Cervical spondylosis as described. Electronically Signed   By: Rockey Childs D.O.   On: 06/12/2024 12:20   CT Cervical Spine Wo Contrast Result Date: 06/12/2024 CLINICAL DATA:  Provided history: Head trauma, minor. Neck trauma. Additional history provided: Fall, abrasion at top of head. EXAM: CT HEAD WITHOUT CONTRAST CT CERVICAL SPINE WITHOUT CONTRAST TECHNIQUE: Multidetector CT imaging of the head and cervical spine was performed following the standard protocol without intravenous contrast. Multiplanar CT image reconstructions of the cervical spine were also generated. RADIATION DOSE REDUCTION: This exam was performed according to the departmental dose-optimization program which includes automated exposure control, adjustment of the mA and/or kV according to patient size and/or use of iterative reconstruction technique. COMPARISON:  Head CT 12/25/2023. Cervical spine CT 12/25/2023. Brain MRI 05/06/2010. FINDINGS: CT HEAD FINDINGS Brain: Generalized cerebral atrophy. Chronic  cyst within the right occipital lobe, unchanged and considered benign. Patchy and ill-defined hypoattenuation within the cerebral white matter, nonspecific but compatible with mild-to-moderate chronic small vessel ischemic disease. There is no acute intracranial hemorrhage. No demarcated cortical infarct. No extra-axial fluid collection. No evidence of an intracranial mass. No midline shift. Vascular: No hyperdense vessel.  Atherosclerotic calcifications. Skull: No calvarial fracture or aggressive osseous lesion. Sinuses/Orbits: No mass or acute finding within the imaged orbits. No significant paranasal sinus disease at the imaged levels. CT CERVICAL SPINE FINDINGS Alignment: Dextrocurvature of the cervical spine. 4 mm C4-C5 grade 1 anterolisthesis. 3 mm T2-T3 grade 1 anterolisthesis. Skull base and vertebrae: The basion-dental and atlanto-dental intervals are maintained.No evidence of acute fracture to the cervical spine. Soft tissues and spinal canal: No prevertebral fluid or swelling. No visible canal hematoma. Disc levels: Cervical spondylosis with multilevel disc space  narrowing, disc bulges/central disc protrusions, posterior disc osteophyte complexes, uncovertebral hypertrophy and facet arthropathy. Disc space narrowing is greatest at C5-C6 (advanced) and C6-C7 (moderate-to-advanced). No appreciable high-grade spinal canal stenosis. Multilevel bony neural foraminal narrowing. Degenerative changes also present at the C1-C2 articulation. Upper chest: No consolidation within the imaged lung apices. No visible pneumothorax. IMPRESSION: CT head: 1.  No evidence of an acute intracranial abnormality. 2. Parenchymal atrophy and chronic small vessel ischemic disease. CT cervical spine: 1. No evidence of an acute cervical spine fracture. 2. Grade 1 anterolisthesis at C4-C5 and T2-T3. 3. Dextrocurvature of the cervical spine. 4. Cervical spondylosis as described. Electronically Signed   By: Rockey Childs D.O.   On:  06/12/2024 12:20   DG Elbow Complete Right Result Date: 06/12/2024 CLINICAL DATA:  Fall. EXAM: RIGHT ELBOW - COMPLETE 3+ VIEW COMPARISON:  None Available. FINDINGS: No acute fracture or dislocation. No aggressive osseous lesion. There are several curvilinear calcific/ossific densities surrounding the proximal radius and ulna, which may represent avulsion fracture/periarticular calcifications of indeterminate age. Correlate clinically. Mild to moderate degenerative arthritis of elbow joint. No radiopaque foreign bodies. Soft tissues are within normal limits. IMPRESSION: *No acute fracture or dislocation. There are several curvilinear calcific/ossific densities surrounding the proximal radius and ulna, which may represent avulsion fracture/periarticular calcifications of indeterminate age. Electronically Signed   By: Ree Molt M.D.   On: 06/12/2024 11:47     Procedures   Medications Ordered in the ED  acetaminophen  (TYLENOL ) tablet 650 mg (650 mg Oral Given 06/12/24 1238)                                    Medical Decision Making   This patient is a 89 y.o. female  who presents to the ED for concern of fall, head injury, weakness.   Differential diagnoses prior to evaluation: The emergent differential diagnosis includes, but is not limited to,  epidural hematoma, subdural hematoma, skull fracture, subarachnoid hemorrhage, unstable cervical spine fracture, concussion vs other MSK injury , CVA, spinal cord injury, ACS, arrhythmia, syncope, orthostatic hypotension, sepsis, hypoglycemia, hypoxia, electrolyte disturbance, endocrine disorder, anemia, environmental exposure, polypharmacy . This is not an exhaustive differential.   Past Medical History / Co-morbidities / Social History: hypertension, hyperlipidemia, arthritis, GERD, osteopenia  Additional history: Chart reviewed. Pertinent results include: Reviewed lab work, imaging from previous emergency department visits.  Physical  Exam: Physical exam performed. The pertinent findings include:  Bruising noted to right side of head, scalp, mild tenderness palpation of the cervical paraspinous muscles, no midline cervical, thoracic, or lumbar paraspinous tenderness.  Mild tenderness to palpation at right elbow with normal range of motion.  Slight global decrease strength, 4/5 bilateral lower extremities, 5/5 upper extremities.   Lab Tests/Imaging studies: I personally interpreted labs/imaging and the pertinent results include: CBC with mild leukocytosis, blood cells 11.1, mild anemia, hemoglobin 10.2, anemia is actually improved from her baseline.  RVP negative for COVID, flu, RSV, BMP pending at time of handoff.  CT head, C-spine, elbow with no acute fractures, or focal abnormality intracranially I agree with the radiologist interpretation.  Cardiac monitoring: EKG obtained and interpreted by myself and attending physician which shows: Normal sinus rhythm, right bundle branch block, nonspecific T wave abnormality, no acute ST changes   Medications: I ordered medication including Tylenol  for pain.  I have reviewed the patients home medicines and have made adjustments as needed.  3:07 PM Care  of Melady Chow Macek transferred to PA [employer] and Dr. Bari at the end of my shift as the patient will require reassessment once labs/imaging have resulted. Patient presentation, ED course, and plan of care discussed with review of all pertinent labs and imaging. Please see his/her note for further details regarding further ED course and disposition. Plan at time of handoff is reassess after lab work. This may be altered or completely changed at the discretion of the oncoming team pending results of further workup.  Final diagnoses:  None    ED Discharge Orders     None          Rosan Sherlean VEAR DEVONNA 06/12/24 1507    Freddi Hamilton, MD 06/16/24 1250

## 2024-07-15 ENCOUNTER — Other Ambulatory Visit: Payer: Self-pay

## 2024-07-15 ENCOUNTER — Emergency Department (HOSPITAL_COMMUNITY)

## 2024-07-15 ENCOUNTER — Emergency Department (HOSPITAL_COMMUNITY)
Admission: EM | Admit: 2024-07-15 | Discharge: 2024-07-15 | Disposition: A | Discharge status: Home / Self Care | Attending: Emergency Medicine | Admitting: Emergency Medicine

## 2024-07-15 DIAGNOSIS — Z79899 Other long term (current) drug therapy: Secondary | ICD-10-CM | POA: Insufficient documentation

## 2024-07-15 DIAGNOSIS — R404 Transient alteration of awareness: Secondary | ICD-10-CM | POA: Insufficient documentation

## 2024-07-15 DIAGNOSIS — R4182 Altered mental status, unspecified: Secondary | ICD-10-CM | POA: Diagnosis present

## 2024-07-15 DIAGNOSIS — I10 Essential (primary) hypertension: Secondary | ICD-10-CM | POA: Insufficient documentation

## 2024-07-15 LAB — CBC WITH DIFFERENTIAL/PLATELET
Abs Immature Granulocytes: 0.01 K/uL (ref 0.00–0.07)
Basophils Absolute: 0 K/uL (ref 0.0–0.1)
Basophils Relative: 0 %
Eosinophils Absolute: 0.1 K/uL (ref 0.0–0.5)
Eosinophils Relative: 2 %
HCT: 36 % (ref 36.0–46.0)
Hemoglobin: 11.1 g/dL — ABNORMAL LOW (ref 12.0–15.0)
Immature Granulocytes: 0 %
Lymphocytes Relative: 12 %
Lymphs Abs: 1 K/uL (ref 0.7–4.0)
MCH: 26.8 pg (ref 26.0–34.0)
MCHC: 30.8 g/dL (ref 30.0–36.0)
MCV: 87 fL (ref 80.0–100.0)
Monocytes Absolute: 0.9 K/uL (ref 0.1–1.0)
Monocytes Relative: 11 %
Neutro Abs: 6.3 K/uL (ref 1.7–7.7)
Neutrophils Relative %: 75 %
Platelets: 382 K/uL (ref 150–400)
RBC: 4.14 MIL/uL (ref 3.87–5.11)
RDW: 17.7 % — ABNORMAL HIGH (ref 11.5–15.5)
WBC: 8.4 K/uL (ref 4.0–10.5)
nRBC: 0 % (ref 0.0–0.2)

## 2024-07-15 LAB — CBG MONITORING, ED: Glucose-Capillary: 103 mg/dL — ABNORMAL HIGH (ref 70–99)

## 2024-07-15 LAB — COMPREHENSIVE METABOLIC PANEL WITH GFR
ALT: 12 U/L (ref 0–44)
AST: 23 U/L (ref 15–41)
Albumin: 3.1 g/dL — ABNORMAL LOW (ref 3.5–5.0)
Alkaline Phosphatase: 50 U/L (ref 38–126)
Anion gap: 11 (ref 5–15)
BUN: 22 mg/dL (ref 8–23)
CO2: 24 mmol/L (ref 22–32)
Calcium: 8.4 mg/dL — ABNORMAL LOW (ref 8.9–10.3)
Chloride: 97 mmol/L — ABNORMAL LOW (ref 98–111)
Creatinine, Ser: 0.74 mg/dL (ref 0.44–1.00)
GFR, Estimated: >60 mL/min (ref >60)
Glucose, Bld: 102 mg/dL — ABNORMAL HIGH (ref 70–99)
Potassium: 4.1 mmol/L (ref 3.5–5.1)
Sodium: 132 mmol/L — ABNORMAL LOW (ref 135–145)
Total Bilirubin: 0.3 mg/dL (ref 0.0–1.2)
Total Protein: 6.5 g/dL (ref 6.5–8.1)

## 2024-07-15 NOTE — ED Triage Notes (Signed)
 Pt bib [facility] from [facility] with complaints of laying down for a nap today at 1300 and husband went to wake her up at 1700 and she was unresponsive. Staff at facility sternal rubbed her and she would stay awake for only a short period of time then would go back to sleep. Pt was initially thought to have a right sided facial droop but was sat up in the chair and droop went away. Pt was found to have oxygen sat of mid 80s and was placed on 15LNC  but taken off oxygen due to pt being AOx4  with resp EU. Arrives without neuro deficits and denies pain.

## 2024-07-15 NOTE — Discharge Instructions (Signed)
Your caregiver has seen you today because you are having problems with feelings of weakness, dizziness, and/or fatigue. Weakness has many different causes, some of which are common and others are very rare. Your caregiver has considered some of the most common causes of weakness and feels it is safe for you to go home and be observed. Not every illness or injury can be identified during an emergency department visit, thus follow-up with your primary healthcare provider is important. Medical conditions can also worsen, so it is also important to return immediately as directed below, or if you have other serious concerns develop. ° °RETURN IMMEDIATELY IF  °you develop new shortness of breath, chest pain, fever, have difficulty moving parts of your body (new weakness, numbness, or incoordination), sudden change in speech, vision, swallowing, or understanding, faint or develop new dizziness, severe headache, become poorly responsive or have an altered mental status compared to baseline for you, new rash, abdominal pain, or bloody stools,  °Return sooner also if you develop new problems for which you have not talked to your caregiver but you feel may be emergency medical conditions. ° °

## 2024-07-15 NOTE — ED Notes (Signed)
 Lab called for results of UA, tube station is not working. EDP made aware

## 2024-07-15 NOTE — ED Provider Notes (Signed)
 [place] EMERGENCY DEPARTMENT AT [facility] Provider Note   CSN: [id number] Arrival date & time: 07/15/24  8090     Patient presents with: Altered Mental Status   Carolyn Henderson is a 89 y.o. female.   The history is provided by the patient and the EMS personnel.  Altered Mental Status Associated symptoms: no fever, no headaches and no vomiting   Patient with history of hypertension, hyperlipidemia presents from [place] for concern for altered mental status.  Patient laid down for a nap in the afternoon, and around 5 PM the husband went to wake her up.  She was unresponsive, so he called staff who came in and was sternal rubbed her.  She woke up briefly then would go back to sleep.  It was thought there was initially a right-sided facial droop but that improved.  She was found to be hypoxic to the 80s initially which has improved  Patient is now awake and alert and denies any complaints    Past Medical History:  Diagnosis Date   Arthritis    Complication of anesthesia    Difficulty sleeping    TAKES TYLENOL  PM NIGHTLY   Diverticulosis    GERD (gastroesophageal reflux disease)    ONLY OCCASIONAL   Grade I diastolic dysfunction 04/25/2024   History of shingles    Hyperlipidemia    Hypertension    Osteopenia    PONV (postoperative nausea and vomiting)    Precancerous skin lesion    Shortness of breath    WITH EXERTION   Thyroid  disease     Prior to Admission medications   Medication Sig Start Date End Date Taking? Authorizing Provider  acetaminophen  (TYLENOL ) 325 MG tablet Take 2 tablets (650 mg total) by mouth every 6 (six) hours as needed for mild pain (or Fever >/= 101). 08/05/14   Danella Cough, PA-C  albuterol  (VENTOLIN  HFA) 108 (90 Base) MCG/ACT inhaler Inhale 1 puff into the lungs every 6 (six) hours as needed for wheezing or shortness of breath.    [provider]  amLODipine  (NORVASC ) 2.5 MG tablet Take 1 tablet (2.5 mg total) by  mouth daily. 10/21/15   Burchette, Wolm ORN, MD  benzonatate  (TESSALON ) 100 MG capsule Take 1 capsule (100 mg total) by mouth 3 (three) times daily as needed for cough. 01/17/16   Burchette, Wolm ORN, MD  budesonide-formoterol (SYMBICORT) 80-4.5 MCG/ACT inhaler Inhale 1 puff into the lungs 2 (two) times daily.    [provider]  Calcium  Carbonate-Vit D-Min (CALCIUM  1200 PO) Take 1 tablet by mouth in the morning and at bedtime.    [provider]  Carboxymethylcell-Hypromellose (GENTEAL OP) Apply 1 drop to eye at bedtime.    [provider]  cholecalciferol  (VITAMIN D ) 1000 UNITS tablet Take 1,000 Units by mouth daily.    [provider]  clotrimazole -betamethasone  (LOTRISONE ) cream Apply 1 application topically 2 (two) times daily. Patient taking differently: Apply 1 application  topically 2 (two) times daily as needed (itching, eczema). 10/27/15   Burchette, Wolm ORN, MD  diphenhydrAMINE  (BENADRYL ) 25 MG tablet Take 25-50 mg by mouth every 6 (six) hours as needed for itching or allergies.     [provider]  diphenhydramine -acetaminophen  (TYLENOL  PM) 25-500 MG TABS tablet Take 1 tablet by mouth at bedtime.    [provider]  ferrous sulfate  324 (65 Fe) MG TBEC Take 324 mg by mouth daily. 04/16/16   [provider]  furosemide  (LASIX ) 20 MG tablet Take  10 mg by mouth daily.    [provider]  gabapentin  (NEURONTIN ) 300 MG capsule TAKE 1 TO 3 CAPSULES BY MOUTH EVERY DAY AS DIRECTED Patient taking differently: Take 300 mg by mouth 2 (two) times daily. 08/18/15   Krystal Reyes LABOR, MD  ibuprofen  (ADVIL ) 400 MG tablet Take 1 tablet (400 mg total) by mouth every 6 (six) hours as needed for up to 20 doses. Patient taking differently: Take 400 mg by mouth every 6 (six) hours as needed for mild pain (pain score 1-3). 03/12/20   Fawze, Mina A, PA-C  levothyroxine  (SYNTHROID ) 112 MCG tablet Take 112 mcg by mouth daily before breakfast.    [provider]  loratadine  (CLARITIN ) 10 MG tablet Take 10 mg by mouth daily.    [provider]  Multiple Vitamins-Minerals (PRESERVISION AREDS 2) CAPS Take 1 capsule by mouth 2 (two) times daily.     [provider]  ondansetron  (ZOFRAN  ODT) 4 MG disintegrating tablet 4mg  ODT q4 hours prn nausea/vomit 04/28/24   Pokhrel, Laxman, MD  Polyethyl Glycol-Propyl Glycol (SYSTANE OP) Apply 1 drop to eye 3 (three) times daily as needed (for dry eyes).     [provider]  Probiotic Product (ACIDOPHILUS/GOAT MILK) CAPS Take 1 capsule by mouth every evening.    [provider]  rOPINIRole  (REQUIP ) 0.25 MG tablet Take 1 tablet by mouth at  bedtime 03/19/16   Burchette, Wolm ORN, MD  VITAMIN B1-B12 IM Inject 1 Dose into the muscle every 30 (thirty) days.    [provider]    Allergies: Cephalosporins, Codeine sulfate, Hydrocodone , Percocet [oxycodone -acetaminophen ], Tramadol , and Penicillins    Review of Systems  Constitutional:  Negative for fever.  Gastrointestinal:  Negative for vomiting.  Neurological:  Negative for headaches.    Updated Vital Signs BP (!) 141/74   Pulse 76   Temp 98 F (36.7 C) (Oral)   Resp (!) 22   SpO2 99%   Physical Exam CONSTITUTIONAL: Elderly, but overall well-appearing, smiling in no distress HEAD: Normocephalic/atraumatic, no visible trauma EYES: EOMI/PERRL ENMT: Mucous membranes moist NECK: supple no meningeal signs CV: S1/S2 noted, no murmurs/rubs/gallops noted LUNGS: Lungs are clear to auscultation bilaterally, no apparent distress ABDOMEN: soft, nontender, no rebound or guarding, bowel sounds noted throughout abdomen GU:no cva tenderness NEURO: Pt is awake/alert/appropriate, moves all extremitiesx4.  No facial droop.  No arm or leg drift.  Patient answers questions appropriately EXTREMITIES: pulses normal/equal, full ROM, no deformities SKIN: warm, color normal  (all labs ordered are listed, but only abnormal  results are displayed) Labs Reviewed  COMPREHENSIVE METABOLIC PANEL WITH GFR - Abnormal; Notable for the following components:      Result Value   Sodium 132 (*)    Chloride 97 (*)    Glucose, Bld 102 (*)    Calcium  8.4 (*)    Albumin 3.1 (*)    All other components within normal limits  CBC WITH DIFFERENTIAL/PLATELET - Abnormal; Notable for the following components:   Hemoglobin 11.1 (*)    RDW 17.7 (*)    All other components within normal limits  CBG MONITORING, ED - Abnormal; Notable for the following components:   Glucose-Capillary 103 (*)    All other components within normal limits  URINE CULTURE  URINALYSIS, ROUTINE W REFLEX MICROSCOPIC    EKG: EKG Interpretation Date/Time:  Wednesday July 15 2024 19:59:04 EDT Ventricular Rate:  76 PR Interval:  160 QRS Duration:  131 QT Interval:  412 QTC Calculation: 464  R Axis:   68  Text Interpretation: Sinus rhythm Right bundle branch block Interpretation limited secondary to artifact Confirmed by Midge Golas ([id number]) on 07/15/2024 8:06:39 PM  Radiology: CT HEAD WO CONTRAST Result Date: 07/15/2024 CLINICAL DATA:  Delirium with altered mental status. EXAM: CT HEAD WITHOUT CONTRAST TECHNIQUE: Contiguous axial images were obtained from the base of the skull through the vertex without intravenous contrast. RADIATION DOSE REDUCTION: This exam was performed according to the departmental dose-optimization program which includes automated exposure control, adjustment of the mA and/or kV according to patient size and/or use of iterative reconstruction technique. COMPARISON:  Head CT 06/12/2024. FINDINGS: Brain: There is moderately advanced cerebral atrophy, small-vessel disease and atrophic ventriculomegaly. Mild generalized cerebellar volume loss. There is a chronic periventricular 2 cm cyst in the right occipital lobe. No cortical based acute infarct, hemorrhage, mass, or mass effect are seen. There is no midline shift.  The basal  cisterns are clear. Vascular: There are calcific plaques in the siphons and distal vertebral arteries. No hyperdense vessel is seen. Skull: Negative for fractures or focal lesions. Sinuses/Orbits: Old lens replacements. Clear paranasal sinuses and mastoids. Other: Right deviated nasal septum with right-sided spurring. IMPRESSION: 1. No acute intracranial CT findings. 2. Atrophy, small-vessel disease and vascular calcifications. Stable exam. 3. 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 Electronically Signed   By: Francis Quam M.D.   On: 07/15/2024 20:10   DG Chest Port 1 View Result Date: 07/15/2024 CLINICAL DATA:  altered mental status EXAM: DG CHEST 1V PORT COMPARISON:  05/09/2022 FINDINGS: Eventration of the right hemidiaphragm. No focal airspace consolidation, pleural effusion, or pneumothorax. No cardiomegaly.Tortuous aorta with aortic atherosclerosis.No acute fracture or destructive lesion. Multilevel vertebroplasty cement noted in the thoracic spine. Multilevel degenerative disc disease of the spine. IMPRESSION: No acute cardiopulmonary abnormality. Electronically Signed   By: Rogelia Myers M.D.   On: 07/15/2024 19:36     Procedures   Medications Ordered in the ED - No data to display  Clinical Course as of 07/15/24 2203  Wed Jul 15, 2024  2011 Patient seen on arrival, discussed with EMS. Patient is now back to baseline, awake alert Workup pending at this time [DW]  2023 Pt still doing well Daughter at bedside-  reports pt was on cipro  recently for cellulitis Workup pending [DW]  2202 Patient has been back to baseline throughout her stay.  She was smiling, awake and alert in no distress  Overall initial workup was unremarkable though her urine is still pending.  However patient has been on Cipro  since last week for cellulitis. She is not septic appearing.  She has had no focal weakness to suggest stroke  Offered patient admission, but she declines and would like  to be discharged home. I discussed this with patient and her daughter and they both are comfortable discharge home [DW]    Clinical Course User Index [DW] Midge Golas, MD                                 Medical Decision Making Amount and/or Complexity of Data Reviewed Labs: ordered. Radiology: ordered.   This patient presents to the ED for concern of weakness and altered mental status, this involves an extensive number of treatment options, and is a complaint that carries with it a high risk of complications and morbidity.  The differential diagnosis includes but is not limited to CVA, intracranial hemorrhage, acute  coronary syndrome, renal failure, urinary tract infection, electrolyte disturbance, pneumonia   Comorbidities that complicate the patient evaluation: Patient's presentation is complicated by their history of hypertension  Social Determinants of Health: Patient resides in a nursing facility which increases the complexity of managing their presentation  Additional history obtained: Additional history obtained from EMS  Records reviewed previous admission documents  Lab Tests: I Ordered, and personally interpreted labs.  The pertinent results include: Labs overall unremarkable  Imaging Studies ordered: I ordered imaging studies including X-ray chest  I independently visualized and interpreted imaging which showed no acute finding I agree with the radiologist interpretation  Test Considered: Admission to the hospital was offered and considered but patient declines and would like to be discharged  Reevaluation: After the interventions noted above, I reevaluated the patient and found that they have :improved  Complexity of problems addressed: Patient's presentation is most consistent with  acute presentation with potential threat to life or bodily function  Disposition: After consideration of the diagnostic results and the patient's response to treatment,  I  feel that the patent would benefit from discharge  .        Final diagnoses:  Transient alteration of awareness    ED Discharge Orders     None          Midge Golas, MD 07/15/24 2203

## 2024-07-16 LAB — URINALYSIS, ROUTINE W REFLEX MICROSCOPIC
Bacteria, UA: NONE SEEN
Bilirubin Urine: NEGATIVE
Glucose, UA: NEGATIVE mg/dL
Hgb urine dipstick: NEGATIVE
Ketones, ur: NEGATIVE mg/dL
Leukocytes,Ua: NEGATIVE
Nitrite: NEGATIVE
Protein, ur: NEGATIVE mg/dL
Specific Gravity, Urine: 1.013 (ref 1.005–1.030)
pH: 6 (ref 5.0–8.0)

## 2024-07-18 LAB — URINE CULTURE: Culture: 20000 — AB

## 2024-07-19 ENCOUNTER — Telehealth (HOSPITAL_BASED_OUTPATIENT_CLINIC_OR_DEPARTMENT_OTHER): Payer: Self-pay | Admitting: *Deleted

## 2024-07-19 NOTE — Telephone Encounter (Signed)
 Post ED Visit - Positive Culture Follow-up  Culture report reviewed by antimicrobial stewardship pharmacist: [facility] []  Rankin Dee, Pharm.D. []  Venetia Gully, Pharm.D., BCPS AQ-ID []  Garrel Crews, Pharm.D., BCPS []  Almarie Lunger, Pharm.D., BCPS []  [place], [address].D., BCPS, AAHIVP []  Rosaline Bihari, Pharm.D., BCPS, AAHIVP []  Vernell Meier, PharmD, BCPS []  Latanya Hint, PharmD, BCPS []  Donald Medley, PharmD, BCPS []  Rocky Bold, PharmD []  Dorothyann Alert, PharmD, BCPS [x]  Dorn poot, PharmD  [facility] []  Rosaline Edison, PharmD []  Romona Bliss, PharmD []  Dolphus Roller, PharmD []  Veva Seip, Rph []  Vernell Daunt) Leonce, PharmD []  Eva Allis, PharmD []  Rosaline Millet, PharmD []  Iantha Batch, PharmD []  Arvin Gauss, PharmD []  Wanda Hasting, PharmD []  Ronal Rav, PharmD []  Rocky Slade, PharmD []  Bard Jeans, PharmD   Positive urine culture [name]  Carolyn Henderson 07/19/2024, 10:12 AM

## 2024-08-30 ENCOUNTER — Emergency Department (HOSPITAL_COMMUNITY)

## 2024-08-30 ENCOUNTER — Inpatient Hospital Stay (HOSPITAL_COMMUNITY)
Admission: EM | Admit: 2024-08-30 | Discharge: 2024-09-03 | DRG: 603 | Disposition: A | Discharge status: Home Health | Attending: Internal Medicine | Admitting: Internal Medicine

## 2024-08-30 ENCOUNTER — Other Ambulatory Visit: Payer: Self-pay

## 2024-08-30 ENCOUNTER — Encounter (HOSPITAL_COMMUNITY): Payer: Self-pay | Admitting: Emergency Medicine

## 2024-08-30 DIAGNOSIS — L578 Other skin changes due to chronic exposure to nonionizing radiation: Secondary | ICD-10-CM | POA: Insufficient documentation

## 2024-08-30 DIAGNOSIS — Z881 Allergy status to other antibiotic agents status: Secondary | ICD-10-CM

## 2024-08-30 DIAGNOSIS — S22080A Wedge compression fracture of T11-T12 vertebra, initial encounter for closed fracture: Secondary | ICD-10-CM | POA: Insufficient documentation

## 2024-08-30 DIAGNOSIS — M810 Age-related osteoporosis without current pathological fracture: Secondary | ICD-10-CM | POA: Diagnosis present

## 2024-08-30 DIAGNOSIS — Z23 Encounter for immunization: Secondary | ICD-10-CM

## 2024-08-30 DIAGNOSIS — G2581 Restless legs syndrome: Secondary | ICD-10-CM | POA: Diagnosis present

## 2024-08-30 DIAGNOSIS — I5189 Other ill-defined heart diseases: Secondary | ICD-10-CM

## 2024-08-30 DIAGNOSIS — Z8619 Personal history of other infectious and parasitic diseases: Secondary | ICD-10-CM | POA: Diagnosis not present

## 2024-08-30 DIAGNOSIS — E0781 Sick-euthyroid syndrome: Secondary | ICD-10-CM | POA: Diagnosis present

## 2024-08-30 DIAGNOSIS — Z7951 Long term (current) use of inhaled steroids: Secondary | ICD-10-CM | POA: Diagnosis not present

## 2024-08-30 DIAGNOSIS — Z79899 Other long term (current) drug therapy: Secondary | ICD-10-CM

## 2024-08-30 DIAGNOSIS — D649 Anemia, unspecified: Secondary | ICD-10-CM | POA: Diagnosis present

## 2024-08-30 DIAGNOSIS — E871 Hypo-osmolality and hyponatremia: Secondary | ICD-10-CM | POA: Diagnosis present

## 2024-08-30 DIAGNOSIS — Z96643 Presence of artificial hip joint, bilateral: Secondary | ICD-10-CM | POA: Diagnosis present

## 2024-08-30 DIAGNOSIS — Z79890 Hormone replacement therapy (postmenopausal): Secondary | ICD-10-CM | POA: Diagnosis not present

## 2024-08-30 DIAGNOSIS — E039 Hypothyroidism, unspecified: Secondary | ICD-10-CM | POA: Diagnosis present

## 2024-08-30 DIAGNOSIS — I5032 Chronic diastolic (congestive) heart failure: Secondary | ICD-10-CM | POA: Diagnosis present

## 2024-08-30 DIAGNOSIS — I10 Essential (primary) hypertension: Secondary | ICD-10-CM | POA: Diagnosis present

## 2024-08-30 DIAGNOSIS — Z8249 Family history of ischemic heart disease and other diseases of the circulatory system: Secondary | ICD-10-CM | POA: Diagnosis not present

## 2024-08-30 DIAGNOSIS — Z880 Allergy status to penicillin: Secondary | ICD-10-CM | POA: Diagnosis not present

## 2024-08-30 DIAGNOSIS — E785 Hyperlipidemia, unspecified: Secondary | ICD-10-CM | POA: Diagnosis present

## 2024-08-30 DIAGNOSIS — Z885 Allergy status to narcotic agent status: Secondary | ICD-10-CM

## 2024-08-30 DIAGNOSIS — Z87891 Personal history of nicotine dependence: Secondary | ICD-10-CM

## 2024-08-30 DIAGNOSIS — L03115 Cellulitis of right lower limb: Secondary | ICD-10-CM | POA: Diagnosis present

## 2024-08-30 DIAGNOSIS — M25559 Pain in unspecified hip: Secondary | ICD-10-CM | POA: Insufficient documentation

## 2024-08-30 LAB — CBC WITH DIFFERENTIAL/PLATELET
Abs Immature Granulocytes: 0.01 K/uL (ref 0.00–0.07)
Basophils Absolute: 0 K/uL (ref 0.0–0.1)
Basophils Relative: 1 %
Eosinophils Absolute: 0.2 K/uL (ref 0.0–0.5)
Eosinophils Relative: 3 %
HCT: 34.1 % — ABNORMAL LOW (ref 36.0–46.0)
Hemoglobin: 10.6 g/dL — ABNORMAL LOW (ref 12.0–15.0)
Immature Granulocytes: 0 %
Lymphocytes Relative: 10 %
Lymphs Abs: 0.6 K/uL — ABNORMAL LOW (ref 0.7–4.0)
MCH: 28 pg (ref 26.0–34.0)
MCHC: 31.1 g/dL (ref 30.0–36.0)
MCV: 90.2 fL (ref 80.0–100.0)
Monocytes Absolute: 0.6 K/uL (ref 0.1–1.0)
Monocytes Relative: 9 %
Neutro Abs: 4.5 K/uL (ref 1.7–7.7)
Neutrophils Relative %: 77 %
Platelets: 320 K/uL (ref 150–400)
RBC: 3.78 MIL/uL — ABNORMAL LOW (ref 3.87–5.11)
RDW: 15.1 % (ref 11.5–15.5)
WBC: 5.9 K/uL (ref 4.0–10.5)
nRBC: 0 % (ref 0.0–0.2)

## 2024-08-30 LAB — COMPREHENSIVE METABOLIC PANEL WITH GFR
ALT: 9 U/L (ref 0–44)
AST: 28 U/L (ref 15–41)
Albumin: 3.6 g/dL (ref 3.5–5.0)
Alkaline Phosphatase: 56 U/L (ref 38–126)
Anion gap: 10 (ref 5–15)
BUN: 15 mg/dL (ref 8–23)
CO2: 23 mmol/L (ref 22–32)
Calcium: 8.8 mg/dL — ABNORMAL LOW (ref 8.9–10.3)
Chloride: 98 mmol/L (ref 98–111)
Creatinine, Ser: 0.55 mg/dL (ref 0.44–1.00)
GFR, Estimated: >60 mL/min (ref >60)
Glucose, Bld: 89 mg/dL (ref 70–99)
Potassium: 4.4 mmol/L (ref 3.5–5.1)
Sodium: 131 mmol/L — ABNORMAL LOW (ref 135–145)
Total Bilirubin: 0.3 mg/dL (ref 0.0–1.2)
Total Protein: 6.9 g/dL (ref 6.5–8.1)

## 2024-08-30 LAB — I-STAT CG4 LACTIC ACID, ED: Lactic Acid, Venous: 0.6 mmol/L (ref 0.5–1.9)

## 2024-08-30 LAB — TSH: TSH: 5.2 u[IU]/mL — ABNORMAL HIGH (ref 0.350–4.500)

## 2024-08-30 LAB — SEDIMENTATION RATE: Sed Rate: 50 mm/h — ABNORMAL HIGH (ref 0–22)

## 2024-08-30 LAB — C-REACTIVE PROTEIN: CRP: 0.7 mg/dL (ref <1.0)

## 2024-08-30 MED ORDER — ONDANSETRON HCL 4 MG PO TABS: 4.0000 mg | ORAL_TABLET | Freq: Four times a day (QID) | ORAL | Status: DC | PRN

## 2024-08-30 MED ORDER — LEVOTHYROXINE SODIUM 112 MCG PO TABS
112.0000 ug | ORAL_TABLET | Freq: Every day | ORAL | Status: DC
  Administered 2024-08-31 – 2024-09-03 (×4): 112 ug via ORAL
  Filled 2024-08-30 (×4): qty 1

## 2024-08-30 MED ORDER — POLYVINYL ALCOHOL 1.4 % OP SOLN
1.0000 [drp] | Freq: Two times a day (BID) | OPHTHALMIC | Status: DC
  Administered 2024-08-30 – 2024-09-03 (×7): 1 [drp] via OPHTHALMIC
  Filled 2024-08-30 (×2): qty 15

## 2024-08-30 MED ORDER — ONDANSETRON HCL 4 MG/2ML IJ SOLN: 4.0000 mg | Freq: Four times a day (QID) | INTRAMUSCULAR | Status: DC | PRN

## 2024-08-30 MED ORDER — FERROUS SULFATE 325 (65 FE) MG PO TABS
325.0000 mg | ORAL_TABLET | Freq: Every day | ORAL | Status: DC
  Administered 2024-08-30 – 2024-09-03 (×5): 325 mg via ORAL
  Filled 2024-08-30 (×5): qty 1

## 2024-08-30 MED ORDER — SODIUM CHLORIDE 0.9 % IV SOLN: INTRAVENOUS | Status: AC

## 2024-08-30 MED ORDER — ALBUTEROL SULFATE (2.5 MG/3ML) 0.083% IN NEBU: 3.0000 mL | INHALATION_SOLUTION | Freq: Four times a day (QID) | RESPIRATORY_TRACT | Status: DC | PRN

## 2024-08-30 MED ORDER — VANCOMYCIN HCL 1250 MG/250ML IV SOLN
1250.0000 mg | INTRAVENOUS | Status: DC
  Administered 2024-09-01: 1250 mg via INTRAVENOUS
  Filled 2024-08-30: qty 250

## 2024-08-30 MED ORDER — VANCOMYCIN HCL 1500 MG/300ML IV SOLN
1500.0000 mg | Freq: Once | INTRAVENOUS | Status: AC
  Administered 2024-08-30: 1500 mg via INTRAVENOUS
  Filled 2024-08-30: qty 300

## 2024-08-30 MED ORDER — ACETAMINOPHEN 650 MG RE SUPP: 650.0000 mg | Freq: Four times a day (QID) | RECTAL | Status: DC | PRN

## 2024-08-30 MED ORDER — ARTIFICIAL TEARS OPHTHALMIC OINT
1.0000 | TOPICAL_OINTMENT | Freq: Two times a day (BID) | OPHTHALMIC | Status: DC
  Filled 2024-08-30: qty 3.5

## 2024-08-30 MED ORDER — ROPINIROLE HCL 1 MG PO TABS
2.0000 mg | ORAL_TABLET | Freq: Every day | ORAL | Status: DC
  Administered 2024-08-30 – 2024-09-02 (×4): 2 mg via ORAL
  Filled 2024-08-30 (×4): qty 2

## 2024-08-30 MED ORDER — KETOROLAC TROMETHAMINE 15 MG/ML IJ SOLN
7.5000 mg | Freq: Once | INTRAMUSCULAR | Status: AC
  Administered 2024-08-30: 7.5 mg via INTRAVENOUS
  Filled 2024-08-30: qty 1

## 2024-08-30 MED ORDER — ENOXAPARIN SODIUM 40 MG/0.4ML IJ SOSY
40.0000 mg | PREFILLED_SYRINGE | INTRAMUSCULAR | Status: DC
  Administered 2024-08-30 – 2024-09-02 (×4): 40 mg via SUBCUTANEOUS
  Filled 2024-08-30 (×4): qty 0.4

## 2024-08-30 MED ORDER — AMLODIPINE BESYLATE 5 MG PO TABS
2.5000 mg | ORAL_TABLET | Freq: Every day | ORAL | Status: DC
  Administered 2024-08-30 – 2024-09-03 (×4): 2.5 mg via ORAL
  Filled 2024-08-30 (×5): qty 1

## 2024-08-30 MED ORDER — RISAQUAD PO CAPS
1.0000 | ORAL_CAPSULE | Freq: Every evening | ORAL | Status: DC
  Administered 2024-08-30 – 2024-09-02 (×4): 1 via ORAL
  Filled 2024-08-30 (×4): qty 1

## 2024-08-30 MED ORDER — GABAPENTIN 300 MG PO CAPS
300.0000 mg | ORAL_CAPSULE | Freq: Two times a day (BID) | ORAL | Status: DC
  Administered 2024-08-30 – 2024-09-03 (×9): 300 mg via ORAL
  Filled 2024-08-30 (×9): qty 1

## 2024-08-30 MED ORDER — FLUTICASONE FUROATE-VILANTEROL 100-25 MCG/ACT IN AEPB
1.0000 | INHALATION_SPRAY | Freq: Every day | RESPIRATORY_TRACT | Status: DC
  Administered 2024-08-31 – 2024-09-03 (×4): 1 via RESPIRATORY_TRACT
  Filled 2024-08-30: qty 28

## 2024-08-30 MED ORDER — ACETAMINOPHEN 325 MG PO TABS
650.0000 mg | ORAL_TABLET | Freq: Four times a day (QID) | ORAL | Status: DC | PRN
  Administered 2024-09-01: 650 mg via ORAL
  Filled 2024-08-30: qty 2

## 2024-08-30 MED ORDER — TETANUS-DIPHTHERIA TOXOIDS TD 5-2 LF/0.5ML IM SUSP
0.5000 mL | Freq: Once | INTRAMUSCULAR | Status: AC
  Administered 2024-08-30: 0.5 mL via INTRAMUSCULAR
  Filled 2024-08-30: qty 0.5

## 2024-08-30 MED ORDER — DICYCLOMINE HCL 10 MG PO CAPS
10.0000 mg | ORAL_CAPSULE | Freq: Four times a day (QID) | ORAL | Status: DC | PRN
Start: 2024-08-30 — End: 2024-09-03

## 2024-08-30 MED ORDER — LORATADINE 10 MG PO TABS
10.0000 mg | ORAL_TABLET | Freq: Every day | ORAL | Status: DC
  Administered 2024-08-30 – 2024-09-03 (×5): 10 mg via ORAL
  Filled 2024-08-30 (×5): qty 1

## 2024-08-30 NOTE — ED Provider Notes (Signed)
 [place] EMERGENCY DEPARTMENT AT [facility] Provider Note   CSN: [id number] Arrival date & time: 08/30/24  9061     Patient presents with: Wound Check   Carolyn Henderson is a 89 y.o. female patient with past medical history of hypertension, hyperlipidemia, hypothyroidism, arthritis, COPD presents to emergency room with complaint of leg wound.  Patient reports that approximately 10 days ago she scraped her right lower leg against the counter causing a laceration.  Over the last 7 days she has gradually had worsening right lower extremity swelling, redness, pain.  This is significantly worsened since last night.  Around this time she was coincidentally started on antibiotic for suspected dental infection and apparently tolerated 2-1/2 days of clindamycin  before she started developing diarrhea and was switched to Bactrim.  Patient has been on Bactrim for the past 5 days and still continues to have worsening symptoms.  No fever here.    Wound Check       Prior to Admission medications   Medication Sig Start Date End Date Taking? Authorizing Provider  acetaminophen  (TYLENOL ) 325 MG tablet Take 2 tablets (650 mg total) by mouth every 6 (six) hours as needed for mild pain (or Fever >/= 101). 08/05/14   Danella Cough, PA-C  albuterol  (VENTOLIN  HFA) 108 (90 Base) MCG/ACT inhaler Inhale 1 puff into the lungs every 6 (six) hours as needed for wheezing or shortness of breath.    [provider]  amLODipine  (NORVASC ) 2.5 MG tablet Take 1 tablet (2.5 mg total) by mouth daily. 10/21/15   Burchette, Wolm ORN, MD  benzonatate  (TESSALON ) 100 MG capsule Take 1 capsule (100 mg total) by mouth 3 (three) times daily as needed for cough. 01/17/16   Burchette, Wolm ORN, MD  budesonide-formoterol (SYMBICORT) 80-4.5 MCG/ACT inhaler Inhale 1 puff into the lungs 2 (two) times daily.    [provider]  Calcium  Carbonate-Vit D-Min (CALCIUM  1200 PO) Take 1 tablet by mouth in the morning and  at bedtime.    [provider]  Carboxymethylcell-Hypromellose (GENTEAL OP) Apply 1 drop to eye at bedtime.    [provider]  cholecalciferol  (VITAMIN D ) 1000 UNITS tablet Take 1,000 Units by mouth daily.    [provider]  clotrimazole -betamethasone  (LOTRISONE ) cream Apply 1 application topically 2 (two) times daily. Patient taking differently: Apply 1 application  topically 2 (two) times daily as needed (itching, eczema). 10/27/15   Burchette, Wolm ORN, MD  diphenhydrAMINE  (BENADRYL ) 25 MG tablet Take 25-50 mg by mouth every 6 (six) hours as needed for itching or allergies.     [provider]  diphenhydramine -acetaminophen  (TYLENOL  PM) 25-500 MG TABS tablet Take 1 tablet by mouth at bedtime.    [provider]  ferrous sulfate  324 (65 Fe) MG TBEC Take 324 mg by mouth daily. 04/16/16   [provider]  furosemide  (LASIX ) 20 MG tablet Take 10 mg by mouth daily.    [provider]  gabapentin  (NEURONTIN ) 300 MG capsule TAKE 1 TO 3 CAPSULES BY MOUTH EVERY DAY AS DIRECTED Patient taking differently: Take 300 mg by mouth 2 (two) times daily. 08/18/15   Krystal Reyes LABOR, MD  ibuprofen  (ADVIL ) 400 MG tablet Take 1 tablet (400 mg total) by mouth every 6 (six) hours as needed for up to 20 doses. Patient taking differently: Take 400 mg by mouth every 6 (six) hours as needed for mild pain (pain score 1-3). 03/12/20   Fawze, Mina A, PA-C  levothyroxine  (SYNTHROID ) 112 MCG tablet  Take 112 mcg by mouth daily before breakfast.    [provider]  loratadine  (CLARITIN ) 10 MG tablet Take 10 mg by mouth daily.    [provider]  Multiple Vitamins-Minerals (PRESERVISION AREDS 2) CAPS Take 1 capsule by mouth 2 (two) times daily.     [provider]  ondansetron  (ZOFRAN  ODT) 4 MG disintegrating tablet 4mg  ODT q4 hours prn nausea/vomit 04/28/24   Pokhrel, Laxman, MD  Polyethyl Glycol-Propyl Glycol (SYSTANE OP) Apply 1 drop to eye 3  (three) times daily as needed (for dry eyes).     [provider]  Probiotic Product (ACIDOPHILUS/GOAT MILK) CAPS Take 1 capsule by mouth every evening.    [provider]  rOPINIRole  (REQUIP ) 0.25 MG tablet Take 1 tablet by mouth at  bedtime 03/19/16   Burchette, Wolm ORN, MD  VITAMIN B1-B12 IM Inject 1 Dose into the muscle every 30 (thirty) days.    [provider]    Allergies: Cephalosporins, Codeine sulfate, Hydrocodone , Percocet [oxycodone -acetaminophen ], Tramadol , and Penicillins    Review of Systems  Skin:  Positive for wound.    Updated Vital Signs BP (!) 153/66 (BP Location: Left Arm)   Pulse (!) 58   Temp 98.5 F (36.9 C) (Oral)   Resp 16   Ht 5' 4 (1.626 m)   Wt 65.3 kg   SpO2 96%   BMI 24.72 kg/m   Physical Exam Vitals and nursing note reviewed.  Constitutional:      General: She is not in acute distress.    Appearance: She is not toxic-appearing.  HENT:     Head: Normocephalic and atraumatic.  Eyes:     General: No scleral icterus.    Conjunctiva/sclera: Conjunctivae normal.  Cardiovascular:     Rate and Rhythm: Normal rate and regular rhythm.     Pulses: Normal pulses.     Heart sounds: Normal heart sounds.  Pulmonary:     Effort: Pulmonary effort is normal. No respiratory distress.     Breath sounds: Normal breath sounds.  Abdominal:     General: Abdomen is flat. Bowel sounds are normal.     Palpations: Abdomen is soft.     Tenderness: There is no abdominal tenderness.  Musculoskeletal:     Right lower leg: Edema present.     Left lower leg: No edema.     Comments: Patient has mild tenderness, warmth, redness over right lower extremity.  She has streaking cellulitis down to her foot and around mid calf.  Strong dorsal pedal pulse.  No obvious fluid collection or area of fluctuance.  Skin:    General: Skin is warm and dry.     Findings: No lesion.  Neurological:     General: No focal deficit present.     Mental Status: She  is alert and oriented to person, place, and time. Mental status is at baseline.        (all labs ordered are listed, but only abnormal results are displayed) Labs Reviewed  CBC WITH DIFFERENTIAL/PLATELET - Abnormal; Notable for the following components:      Result Value   RBC 3.78 (*)    Hemoglobin 10.6 (*)    HCT 34.1 (*)    Lymphs Abs 0.6 (*)    All other components within normal limits  COMPREHENSIVE METABOLIC PANEL WITH GFR - Abnormal; Notable for the following components:   Sodium 131 (*)    Calcium  8.8 (*)    All other components within normal limits  CULTURE,  BLOOD (ROUTINE X 2)  CULTURE, BLOOD (ROUTINE X 2)  TSH  I-STAT CG4 LACTIC ACID, ED    EKG: None  Radiology: DG Tibia/Fibula Right Result Date: 08/30/2024 CLINICAL DATA:  Fall 10 days ago with lower leg laceration Pred increased swelling and erythema since last night. Cellulitis. EXAM: RIGHT TIBIA AND FIBULA - 2 VIEW COMPARISON:  Ankle radiographs 05/21/2022. FINDINGS: The mineralization and alignment are normal. There is no evidence of acute fracture or dislocation. Mild tricompartmental degenerative changes at the knee. No significant ankle arthropathy identified. There is generalized mild soft tissue edema without evidence of foreign body or soft tissue emphysema. Scattered vascular calcifications noted. IMPRESSION: No acute osseous findings or evidence of soft tissue emphysema. Mild soft tissue edema which could reflect cellulitis. Electronically Signed   By: Elsie Perone M.D.   On: 08/30/2024 11:23     Procedures   Medications Ordered in the ED  vancomycin (VANCOREADY) IVPB 1500 mg/300 mL (has no administration in time range)  enoxaparin (LOVENOX) injection 40 mg (has no administration in time range)  acetaminophen  (TYLENOL ) tablet 650 mg (has no administration in time range)    Or  acetaminophen  (TYLENOL ) suppository 650 mg (has no administration in time range)  ondansetron  (ZOFRAN ) tablet 4 mg (has no  administration in time range)    Or  ondansetron  (ZOFRAN ) injection 4 mg (has no administration in time range)  0.9 %  sodium chloride  infusion (has no administration in time range)                                    Medical Decision Making Amount and/or Complexity of Data Reviewed Labs: ordered. Radiology: ordered.  Risk Prescription drug management. Decision regarding hospitalization.   This patient presents to the ED for concern of right leg cellulitis, this involves an extensive number of treatment options, and is a complaint that carries with it a high risk of complications and morbidity.  The differential diagnosis includes deep space infection, septic joint, necrotizing fasciitis, osteomyelitis, cellulitis, abscess, DVT   Co morbidities that complicate the patient evaluation  HTN, HLD, Hypothyroidism    Additional history obtained:  Additional history obtained from I did review patient's ED visit from 06/12/2024 as well as 07/15/2024 in which patient was discharged.   Lab Tests:  I personally interpreted labs.  The pertinent results include:   No leukocytosis, stable anemia.  CMP unremarkable. Lactic acid within normal limits Blood cultures obtained and pending   Imaging Studies ordered:  I ordered imaging studies including imaging of the right tib-fib I independently visualized and interpreted imaging which showed no acute findings I agree with the radiologist interpretation   Cardiac Monitoring: / EKG:  The patient was maintained on a cardiac monitor.   Consultations Obtained:  I requested consultation with the [facility] team Dr Celinda for admission,  and discussed lab and imaging findings as well as pertinent plan.   Problem List / ED Course / Critical interventions / Medication management  Presents to emergency room with complaint of right lower leg cellulitis that has been progressively worsening for the past 7 days.  She has failed outpatient  treatment of antibiotic.  This started after sustaining laceration to left lower leg.  She is neurovascularly intact.  Her x-ray shows no evidence of deep space infection including no emphysema and no sign of osteomyelitis or fracture.  Patient is hemodynamically stable and well-appearing.  Given outpatient  failure of cellulitis I do think she is requiring IV antibiotics and admission at this point. I did order vancomycin for cellulitis treatment. I have reviewed the patients home medicines and have made adjustments as needed        Final diagnoses:  Cellulitis of right lower extremity    ED Discharge Orders     None          Shermon Warren SAILOR, PA-C 08/30/24 1238    Suzette Pac, MD 09/01/24 [phone]

## 2024-08-30 NOTE — Consult Note (Signed)
 WOC Nurse Consult Note: Reason for Consult: leg wound  Wound type: trauma  Pressure Injury POA: NA Measurement: see nursing flow sheets Wound bed: linear, not able to assess wound bed Drainage (amount, consistency, odor) see nursing flow sheets Periwound: edema, ecchymosis  Dressing procedure/placement/frequency: Cleanse RLE with saline, pat dry Apply xeroform and dry dressing, wrap with kerlix Change every other day  Re consult if needed, will not follow at this time. Thanks  Madilynne [employer]an [employer], RN,CWOCN, CNS, [employer] [phone]

## 2024-08-30 NOTE — H&P (Signed)
 History and Physical    Patient: Carolyn Henderson FMW:[id number] DOB: 02/07/1930 DOA: 08/30/2024 DOS: the patient was seen and examined on 08/30/2024 PCP: Othelia Delon PARAS, FNP  Patient coming from: Home  Chief Complaint:  Chief Complaint  Patient presents with   Wound Check   HPI: Carolyn Henderson is a 89 y.o. female with medical history significant of arthritis, diverticulosis, herpes zoster, postherpetic neuralgia, hyperlipidemia, hypertension, grade 1 diastolic dysfunction, PACs, osteopenia, hypothyroidism, osteoporosis, thoracic vertebra compression fracture, closed sacral fracture, lumbar with sciatica, microcytic anemia, urticaria, eczema, seborrheic keratosis, B12 deficiency, history UTI, hyponatremia, SIADH who had a laceration to her lateral lower leg developing erythema, edema, calor and TTP that has not improved with a course of clindamycin  and also a course of Bactrim.  98.5 F, pulse 64, respirations 16, BP 128/62 mmHg O2 sat 98% on room air.  The patient was started on vancomycin.  Lab work: CBC showed a white count of 5.9 with 77% neutrophils, hemoglobin 10.6 g/dL and platelets 679.  CMP showed a 131 mmol/L, the rest of the CMP measurements were normal after calcium  correction.  Imaging: Right tibia/fibula x-ray with no osseous findings or evidence of soft tissue emphysema.  Mild soft tissue edema which could reflect cellulitis.   Review of Systems: As mentioned in the history of present illness. All other systems reviewed and are negative. Past Medical History:  Diagnosis Date   Arthritis    Complication of anesthesia    Difficulty sleeping    TAKES TYLENOL  PM NIGHTLY   Diverticulosis    GERD (gastroesophageal reflux disease)    ONLY OCCASIONAL   Grade I diastolic dysfunction 04/25/2024   History of shingles    Hyperlipidemia    Hypertension    Osteopenia    PONV (postoperative nausea and vomiting)    Precancerous skin lesion    Shortness of breath    WITH  EXERTION   Thyroid  disease    Past Surgical History:  Procedure Laterality Date   ABDOMINAL HYSTERECTOMY     BACK SURGERY     CATARACT EXTRACTION     COLON SURGERY  1983   COLON RESECTION   JOINT REPLACEMENT     TOTAL KNEE 2012 / BIL TOTAL HIPS    LUMBAR LAMINECTOMY     TONSILLECTOMY     TOTAL HIP ARTHROPLASTY  1999 / 2007   BILATERAL   TOTAL HIP REVISION Right 08/02/2014   Procedure: RIGHT TOTAL HIP REVISION;  Surgeon: Donnice JONETTA Car, MD;  Location: [facility];  Service: Orthopedics;  Laterality: Right;   Social History:  reports that she quit smoking about 32 years ago. Her smoking use included cigarettes. She has never used smokeless tobacco. She reports that she does not drink alcohol  and does not use drugs.  Allergies  Allergen Reactions   Cephalosporins     Unknown reaction    Codeine Sulfate Nausea And Vomiting   Hydrocodone  Itching and Other (See Comments)   Percocet [Oxycodone -Acetaminophen ] Nausea And Vomiting   Tramadol  Nausea Only   Penicillins Swelling and Rash    Has patient had a PCN reaction causing immediate rash, facial/tongue/throat swelling, SOB or lightheadedness with hypotension: yes Has patient had a PCN reaction causing severe rash involving mucus membranes or skin necrosis: no Has patient had a PCN reaction that required hospitalization: yes Has patient had a PCN reaction occurring within the last 10 years: no  If all of the above answers are NO, then may proceed with Cephalosporin use.  Family History  Problem Relation Age of Onset   Hypertension Other        fhx   Cancer Other        prostate/fhx    Prior to Admission medications   Medication Sig Start Date End Date Taking? Authorizing Provider  acetaminophen  (TYLENOL ) 325 MG tablet Take 2 tablets (650 mg total) by mouth every 6 (six) hours as needed for mild pain (or Fever >/= 101). 08/05/14   Danella Cough, PA-C  albuterol  (VENTOLIN  HFA) 108 (90 Base) MCG/ACT inhaler Inhale 1 puff into  the lungs every 6 (six) hours as needed for wheezing or shortness of breath.    [provider]  amLODipine  (NORVASC ) 2.5 MG tablet Take 1 tablet (2.5 mg total) by mouth daily. 10/21/15   Burchette, Wolm ORN, MD  benzonatate  (TESSALON ) 100 MG capsule Take 1 capsule (100 mg total) by mouth 3 (three) times daily as needed for cough. 01/17/16   Burchette, Wolm ORN, MD  budesonide-formoterol (SYMBICORT) 80-4.5 MCG/ACT inhaler Inhale 1 puff into the lungs 2 (two) times daily.    [provider]  Calcium  Carbonate-Vit D-Min (CALCIUM  1200 PO) Take 1 tablet by mouth in the morning and at bedtime.    [provider]  Carboxymethylcell-Hypromellose (GENTEAL OP) Apply 1 drop to eye at bedtime.    [provider]  cholecalciferol  (VITAMIN D ) 1000 UNITS tablet Take 1,000 Units by mouth daily.    [provider]  clotrimazole -betamethasone  (LOTRISONE ) cream Apply 1 application topically 2 (two) times daily. Patient taking differently: Apply 1 application  topically 2 (two) times daily as needed (itching, eczema). 10/27/15   Burchette, Wolm ORN, MD  diphenhydrAMINE  (BENADRYL ) 25 MG tablet Take 25-50 mg by mouth every 6 (six) hours as needed for itching or allergies.     [provider]  diphenhydramine -acetaminophen  (TYLENOL  PM) 25-500 MG TABS tablet Take 1 tablet by mouth at bedtime.    [provider]  ferrous sulfate  324 (65 Fe) MG TBEC Take 324 mg by mouth daily. 04/16/16   [provider]  furosemide  (LASIX ) 20 MG tablet Take 10 mg by mouth daily.    [provider]  gabapentin  (NEURONTIN ) 300 MG capsule TAKE 1 TO 3 CAPSULES BY MOUTH EVERY DAY AS DIRECTED Patient taking differently: Take 300 mg by mouth 2 (two) times daily. 08/18/15   Krystal Reyes LABOR, MD  ibuprofen  (ADVIL ) 400 MG tablet Take 1 tablet (400 mg total) by mouth every 6 (six) hours as needed for up to 20 doses. Patient taking differently: Take 400 mg by mouth every 6 (six) hours  as needed for mild pain (pain score 1-3). 03/12/20   Fawze, Mina A, PA-C  levothyroxine  (SYNTHROID ) 112 MCG tablet Take 112 mcg by mouth daily before breakfast.    [provider]  loratadine  (CLARITIN ) 10 MG tablet Take 10 mg by mouth daily.    [provider]  Multiple Vitamins-Minerals (PRESERVISION AREDS 2) CAPS Take 1 capsule by mouth 2 (two) times daily.     [provider]  ondansetron  (ZOFRAN  ODT) 4 MG disintegrating tablet 4mg  ODT q4 hours prn nausea/vomit 04/28/24   Pokhrel, Laxman, MD  Polyethyl Glycol-Propyl Glycol (SYSTANE OP) Apply 1 drop to eye 3 (three) times daily as needed (for dry eyes).     [provider]  Probiotic Product (ACIDOPHILUS/GOAT MILK) CAPS Take 1 capsule by mouth every evening.    [provider]  rOPINIRole  (REQUIP ) 0.25 MG tablet Take 1 tablet by mouth at  bedtime 03/19/16   Burchette, Wolm ORN, MD  VITAMIN B1-B12 IM Inject 1 Dose into the muscle every 30 (thirty) days.    [provider]    Physical Exam: Vitals:   08/30/24 1015 08/30/24 1030 08/30/24 1115 08/30/24 1200  BP: (!) 140/64 133/71 (!) 143/65 (!) 153/66  Pulse: 64 63 63 (!) 58  Resp:    16  Temp:      TempSrc:      SpO2: 97% 98% 98% 96%  Weight:      Height:       Physical Exam Vitals reviewed.  Constitutional:      Appearance: She is ill-appearing.  HENT:     Head: Normocephalic.     Nose: No rhinorrhea.     Mouth/Throat:     Mouth: Mucous membranes are moist.  Eyes:     General: No scleral icterus.    Pupils: Pupils are equal, round, and reactive to light.  Cardiovascular:     Rate and Rhythm: Normal rate and regular rhythm.  Pulmonary:     Effort: Pulmonary effort is normal.     Breath sounds: Normal breath sounds.  Abdominal:     General: Bowel sounds are normal. There is no distension.     Palpations: Abdomen is soft.     Tenderness: There is no abdominal tenderness. There is no right CVA tenderness or left CVA tenderness.   Musculoskeletal:     Cervical back: Neck supple.     Right lower leg: No edema.     Left lower leg: No edema.  Skin:    General: Skin is warm.     Coloration: Skin is not jaundiced.     Findings: Erythema present. No lesion.  Neurological:     General: No focal deficit present.     Mental Status: She is alert and oriented to person, place, and time.  Psychiatric:        Mood and Affect: Mood normal.        Behavior: Behavior normal.          Data Reviewed:  Results are pending, will review when available.  07/05/2023 echocardiogram report. IMPRESSIONS:   1. Left ventricular ejection fraction, by estimation, is 60 to 65%. Left  ventricular ejection fraction by 3D volume is 62 %. The left ventricle has  normal function. The left ventricle has no regional wall motion  abnormalities. There is moderate  concentric left ventricular hypertrophy. Left ventricular diastolic  parameters are consistent with Grade I diastolic dysfunction (impaired  relaxation).   2. Right ventricular systolic function is normal. The right ventricular  size is normal. There is normal pulmonary artery systolic pressure. The  estimated right ventricular systolic pressure is 26.4 mmHg.   3. Left atrial size was mildly dilated.   4. The mitral valve is degenerative. Mild mitral valve regurgitation. No  evidence of mitral stenosis.   5. The aortic valve is tricuspid. There is moderate calcification of the  aortic valve. There is moderate thickening of the aortic valve. Aortic  valve regurgitation is trivial. Aortic valve sclerosis/calcification is  present, without any evidence of  aortic stenosis.   6. The inferior vena cava is normal in size with greater than 50%  respiratory variability, suggesting right atrial pressure of 3 mmHg.   Assessment and Plan: Principal Problem:   Cellulitis of right lower extremity Admit to telemetry/inpatient. Td toxoid IM x 1. Analgesics as needed. Antiemetics  as needed. Continue vancomycin per pharmacy. Follow-up blood  culture and sensitivity Follow CBC and CMP in a.m.  Active Problems:   Hyponatremia Mild with unknown clinical significance. No longer on furosemide , no GI losses. Will continue to monitor.    Hypothyroidism Check TSH level. Continue levothyroxine  112 mcg p.o. daily.    Hyperlipidemia Currently not on therapy. Might benefit from lifestyle modifications Follow-up with primary care provider.    Essential hypertension Continue amlodipine  2.5 mg p.o. daily.    Grade I diastolic dysfunction No signs of decompensation.    Normocytic anemia Monitor hematocrit and hemoglobin.    RLS (restless legs syndrome) Continue ropinirole  2 mg p.o. bedtime.    Advance Care Planning:   Code Status: Full Code   Consults:   Family Communication:   Severity of Illness: The appropriate patient status for this patient is INPATIENT. Inpatient status is judged to be reasonable and necessary in order to provide the required intensity of service to ensure the patient's safety. The patient's presenting symptoms, physical exam findings, and initial radiographic and laboratory data in the context of their chronic comorbidities is felt to place them at high risk for further clinical deterioration. Furthermore, it is not anticipated that the patient will be medically stable for discharge from the hospital within 2 midnights of admission.   * I certify that at the point of admission it is my clinical judgment that the patient will require inpatient hospital care spanning beyond 2 midnights from the point of admission due to high intensity of service, high risk for further deterioration and high frequency of surveillance required.*  Author: Alm Dorn Castor, MD 08/30/2024 12:24 PM  For on call review [email or web address] .  This document was prepared using Dragon voice recognition software and may contain some unintended transcription errors.

## 2024-08-30 NOTE — Progress Notes (Signed)
 Pharmacy Antibiotic Note  Carolyn Henderson is a 89 y.o. female admitted on 08/30/2024 with cellulitis. Pharmacy has been consulted for vancomycin dosing.  Plan: -Vancomycin 1500 mg IV x 1 followed by 1250 mg IV q36h  Height: 5' 4 (162.6 cm) Weight: 65.3 kg (144 lb) IBW/kg (Calculated) : 54.7  Temp (24hrs), Avg:98.5 F (36.9 C), Min:98.5 F (36.9 C), Max:98.5 F (36.9 C)  Recent Labs  Lab 08/30/24 1050 08/30/24 1058  WBC 5.9  --   CREATININE 0.55  --   LATICACIDVEN  --  0.6    Estimated Creatinine Clearance: 38.7 mL/min (by C-G formula based on SCr of 0.55 mg/dL).    Allergies  Allergen Reactions   Cephalosporins     Unknown reaction    Codeine Sulfate Nausea And Vomiting   Hydrocodone  Itching and Other (See Comments)   Percocet [Oxycodone -Acetaminophen ] Nausea And Vomiting   Tramadol  Nausea Only   Penicillins Swelling and Rash    Has patient had a PCN reaction causing immediate rash, facial/tongue/throat swelling, SOB or lightheadedness with hypotension: yes Has patient had a PCN reaction causing severe rash involving mucus membranes or skin necrosis: no Has patient had a PCN reaction that required hospitalization: yes Has patient had a PCN reaction occurring within the last 10 years: no  If all of the above answers are NO, then may proceed with Cephalosporin use.     Antimicrobials this admission: Vancomycin 11/16 >>  Dose adjustments this admission: NA  Microbiology results: 11/16 BCx: pending   Thank you for allowing pharmacy to be a part of this patient's care.  Stefano MARLA Bologna, PharmD, BCPS Clinical Pharmacist 08/30/2024 12:38 PM

## 2024-08-30 NOTE — ED Triage Notes (Signed)
 Pt fell 10 days ago and cut right lower leg. Pt has been receiving wound care but states they couldn't come out until Monday. Pt states her leg developed redness and swelling that started last night. Pt states the pain has also gotten worst. Denies N/V/D.

## 2024-08-30 NOTE — Progress Notes (Signed)
 ED Pharmacy Antibiotic Sign Off An antibiotic consult was received from an ED provider for vancomycin per pharmacy dosing for cellulitis. A chart review was completed to assess appropriateness.   The following one time order(s) were placed: Vancomycin 1500mg  IV x1  Further antibiotic and/or antibiotic pharmacy consults should be ordered by the admitting provider if indicated.   Thank you for allowing pharmacy to be a part of this patient's care.   Lacinda Moats, PharmD Clinical Pharmacist  11/16/202512:14 PM

## 2024-08-31 ENCOUNTER — Other Ambulatory Visit (HOSPITAL_COMMUNITY): Payer: Self-pay

## 2024-08-31 ENCOUNTER — Telehealth (HOSPITAL_COMMUNITY): Payer: Self-pay | Admitting: Pharmacy Technician

## 2024-08-31 DIAGNOSIS — L03115 Cellulitis of right lower limb: Secondary | ICD-10-CM | POA: Diagnosis not present

## 2024-08-31 LAB — COMPREHENSIVE METABOLIC PANEL WITH GFR
ALT: 8 U/L (ref 0–44)
AST: 25 U/L (ref 15–41)
Albumin: 3.1 g/dL — ABNORMAL LOW (ref 3.5–5.0)
Alkaline Phosphatase: 52 U/L (ref 38–126)
Anion gap: 8 (ref 5–15)
BUN: 14 mg/dL (ref 8–23)
CO2: 23 mmol/L (ref 22–32)
Calcium: 8.3 mg/dL — ABNORMAL LOW (ref 8.9–10.3)
Chloride: 101 mmol/L (ref 98–111)
Creatinine, Ser: 0.41 mg/dL — ABNORMAL LOW (ref 0.44–1.00)
GFR, Estimated: >60 mL/min (ref >60)
Glucose, Bld: 84 mg/dL (ref 70–99)
Potassium: 4.2 mmol/L (ref 3.5–5.1)
Sodium: 131 mmol/L — ABNORMAL LOW (ref 135–145)
Total Bilirubin: 0.3 mg/dL (ref 0.0–1.2)
Total Protein: 6.1 g/dL — ABNORMAL LOW (ref 6.5–8.1)

## 2024-08-31 LAB — CBC
HCT: 32 % — ABNORMAL LOW (ref 36.0–46.0)
Hemoglobin: 10 g/dL — ABNORMAL LOW (ref 12.0–15.0)
MCH: 28.5 pg (ref 26.0–34.0)
MCHC: 31.3 g/dL (ref 30.0–36.0)
MCV: 91.2 fL (ref 80.0–100.0)
Platelets: 310 K/uL (ref 150–400)
RBC: 3.51 MIL/uL — ABNORMAL LOW (ref 3.87–5.11)
RDW: 15.1 % (ref 11.5–15.5)
WBC: 4.9 K/uL (ref 4.0–10.5)
nRBC: 0 % (ref 0.0–0.2)

## 2024-08-31 NOTE — Progress Notes (Signed)
 Mobility Specialist Progress Note:   08/31/24 1619  Mobility  Activity Ambulated with assistance  Level of Assistance Minimal assist, patient does 75% or more  Assistive Device Front wheel walker  Distance Ambulated (ft) 215 ft  Activity Response Tolerated well  Mobility Referral Yes  Mobility visit 1 Mobility  Mobility Specialist Start Time (ACUTE ONLY) 1555  Mobility Specialist Stop Time (ACUTE ONLY) 1615  Mobility Specialist Time Calculation (min) (ACUTE ONLY) 20 min   Pt was received in bed and agreed to mobility. No complaints during session. Returned to bed with all needs met. Call bell in reach.  [employer] - Mobility Specialist

## 2024-08-31 NOTE — Telephone Encounter (Signed)
 Patient Product/process Development Scientist completed.    The patient is insured through [facility]. Patient has Medicare and is not eligible for a copay card, but may be able to apply for patient assistance or Medicare RX Payment Plan (Patient Must reach out to their plan, if eligible for payment plan), if available.    Ran test claim for [employer]   and the current 30 day co-pay is $47.00.   This test claim was processed through [facility]- copay amounts may vary at other pharmacies due to pharmacy/plan contracts, or as the patient moves through the different stages of their insurance plan.     Reyes Sharps, CPHT Pharmacy Technician Patient Advocate Specialist Lead [facility] Patient Advocate Team Direct Number: [phone]  Fax: [phone]

## 2024-08-31 NOTE — Evaluation (Signed)
 Physical Therapy Evaluation Patient Details Name: Carolyn Henderson MRN: [id number] DOB: 01-29-1930 Today's Date: 08/31/2024  History of Present Illness  89 yo female presents to therapy following hospitalization on 08/30/2024 due to R LE laceration with worsening edema, calor, rubor and TTP with no changes with PO and topical ABX. Pt started on vancomycin for R LE cellulitis. Pt hospitalized July of this ear secondary to UTI. Pt PMH includes but is not limited to: arthritis, diverticulosis, herpes zoster, postherpetic neuralgia, HLD, HTN, diastolic dysfunction, PACs, OP, hypothyroidism, t-spine compression fx, sacral fx, sciatica, anemia, SIADH, GERD, SOB, B TKA and B THA, colon resection and back surgeries.  Clinical Impression    Pt admitted with above diagnosis.  Pt currently with functional limitations due to the deficits listed below (see PT Problem List). Pt seated in recliner when PT arrived. Pt agreeable to therapy intervention. Pt reports no R LE pain at rest nor with WB however TTP. Pt is CGA and cues for transfer tasks, gait with RW CGA and progressing to close S cues for posture and proper distance from RW for 125 feet. Pt left seated in recliner and all needs in place.   Pt will benefit from acute skilled PT to increase their independence and safety with mobility to allow discharge.         If plan is discharge home, recommend the following: A little help with walking and/or transfers;A little help with bathing/dressing/bathroom;Assistance with cooking/housework;Assist for transportation   Can travel by private vehicle        Equipment Recommendations None recommended by PT  Recommendations for Other Services       Functional Status Assessment Patient has had a recent decline in their functional status and demonstrates the ability to make significant improvements in function in a reasonable and predictable amount of time.     Precautions / Restrictions Precautions Precautions:  Fall Restrictions Weight Bearing Restrictions Per Provider Order: No      Mobility  Bed Mobility               General bed mobility comments: pt seated in recliner when PT arrived and returned to recliner at end of Eval    Transfers Overall transfer level: Needs assistance Equipment used: Rolling walker (2 wheels) Transfers: Sit to/from Stand Sit to Stand: Contact guard assist           General transfer comment: min cues for reaching back for sitting surface    Ambulation/Gait Ambulation/Gait assistance: Contact guard assist, Supervision Gait Distance (Feet): 125 Feet Assistive device: Rolling walker (2 wheels) Gait Pattern/deviations: Step-through pattern, Trunk flexed Gait velocity: decreased     General Gait Details: pt required cues for posture, proper distance from RW and safety with turns  Stairs            Wheelchair Mobility     Tilt Bed    Modified Rankin (Stroke Patients Only)       Balance Overall balance assessment: Mild deficits observed, not formally tested (pt reports one near fall resulting in R LE injury and now cellulitis)                                           Pertinent Vitals/Pain Pain Assessment Pain Assessment: Faces Faces Pain Scale: Hurts little more Pain Location: R distal LE Pain Descriptors / Indicators: Aching, Discomfort Pain Intervention(s): Limited activity within patient's  tolerance, Monitored during session, Repositioned    Home Living Family/patient expects to be discharged to:: Private residence Living Arrangements: Spouse/significant other Available Help at Discharge: Family;Available 24 hours/day Type of Home: Apartment Home Access: Level entry       Home Layout: One level Home Equipment: Agricultural Consultant (2 wheels);Wheelchair Financial Trader (4 wheels) Additional Comments: pt resides in ILF at [facility]    Prior Function Prior Level of Function : Independent/Modified  Independent             Mobility Comments: Walks with RW; mostly household distances ADLs Comments: independent with adls; could do light IADLs (daughter provides some A for IADLs and driving pt is able to do light meal prep for breakfast and dinner ILF provides lunch)     Extremity/Trunk Assessment        Lower Extremity Assessment Lower Extremity Assessment: Overall WFL for tasks assessed    Cervical / Trunk Assessment Cervical / Trunk Assessment: Back Surgery (scoliosis, R hip elevated)  Communication   Communication Communication: No apparent difficulties    Cognition Arousal: Alert Behavior During Therapy: WFL for tasks assessed/performed   PT - Cognitive impairments: No apparent impairments                         Following commands: Intact       Cueing       General Comments General comments (skin integrity, edema, etc.): R LE TTP with rubor, calor and edema no pain with R LE WB    Exercises     Assessment/Plan    PT Assessment Patient needs continued PT services  PT Problem List Decreased activity tolerance;Decreased balance;Decreased mobility       PT Treatment Interventions DME instruction;Gait training;Functional mobility training;Therapeutic activities;Therapeutic exercise;Balance training;Neuromuscular re-education;Patient/family education    PT Goals (Current goals can be found in the Care Plan section)  Acute Rehab PT Goals Patient Stated Goal: to be able to get over the infection and get back home PT Goal Formulation: With patient Time For Goal Achievement: 09/14/24 Potential to Achieve Goals: Good    Frequency Min 3X/week     Co-evaluation               AM-PAC PT 6 Clicks Mobility  Outcome Measure Help needed turning from your back to your side while in a flat bed without using bedrails?: A Little Help needed moving from lying on your back to sitting on the side of a flat bed without using bedrails?: A  Little Help needed moving to and from a bed to a chair (including a wheelchair)?: A Little Help needed standing up from a chair using your arms (e.g., wheelchair or bedside chair)?: A Little Help needed to walk in hospital room?: A Little Help needed climbing 3-5 steps with a railing? : A Lot 6 Click Score: 17    End of Session Equipment Utilized During Treatment: Gait belt Activity Tolerance: Patient tolerated treatment well Patient left: in chair;with call bell/phone within reach Nurse Communication: Mobility status PT Visit Diagnosis: Unsteadiness on feet (R26.81);Other abnormalities of gait and mobility (R26.89);Difficulty in walking, not elsewhere classified (R26.2)    Time: 8955-8898 PT Time Calculation (min) (ACUTE ONLY): 17 min   Charges:   PT Evaluation $PT Eval Low Complexity: 1 Low   PT General Charges $$ ACUTE PT VISIT: 1 Visit         [place], PT Acute Rehab   [place] VEAR Drone 08/31/2024, 1:08 PM

## 2024-08-31 NOTE — Progress Notes (Signed)
 Progress Note   Patient: Carolyn Henderson FMW:[id number] DOB: 06-04-1930 DOA: 08/30/2024     1 DOS: the patient was seen and examined on 08/31/2024   Brief hospital course: KORRINA ZERN is a 89 y.o. female with medical history significant of arthritis, diverticulosis, herpes zoster, postherpetic neuralgia, hyperlipidemia, hypertension, grade 1 diastolic dysfunction, PACs, osteopenia, hypothyroidism, osteoporosis, thoracic vertebra compression fracture, closed sacral fracture, lumbar with sciatica, microcytic anemia, urticaria, eczema, seborrheic keratosis, B12 deficiency, history UTI, hyponatremia, SIADH who had a laceration to her lateral lower leg developing erythema, edema, calor and TTP that has not improved with a course of clindamycin  and also a course of Bactrim.   98.5 F, pulse 64, respirations 16, BP 128/62 mmHg O2 sat 98% on room air.  The patient was started on vancomycin.   Lab work: CBC showed a white count of 5.9 with 77% neutrophils, hemoglobin 10.6 g/dL and platelets 679.  CMP showed a 131 mmol/L, the rest of the CMP measurements were normal after calcium  correction.   Imaging: Right tibia/fibula x-ray with no osseous findings or evidence of soft tissue emphysema.  Mild soft tissue edema which could reflect cellulitis.  The patient has been admitted to a med/surg bed. She is receiving IV Vancomyin. Blood cultures x 2 have been obtained on 08/30/2024. There has been no growth.  Assessment and Plan: Principal Problem:   Cellulitis of right lower extremity Admit to telemetry/inpatient. Td toxoid IM x 1. Analgesics as needed. Antiemetics as needed. Continue vancomycin per pharmacy. Follow-up blood culture and sensitivity Follow CBC and CMP in a.m.   Active Problems:   Hyponatremia Mild with unknown clinical significance. No longer on furosemide , no GI losses. Mild. Continue to monitor.     Hypothyroidism TSH level was 5.2. No FT4. Likely euthyroid sick syndrome. Will  recheck with FT4. Continue levothyroxine  112 mcg p.o. daily.     Hyperlipidemia Currently not on therapy. Might benefit from lifestyle modifications Follow-up with primary care provider.     Essential hypertension Continue amlodipine  2.5 mg p.o. daily.     Grade I diastolic dysfunction No signs of decompensation.     Normocytic anemia Monitor hematocrit and hemoglobin.     RLS (restless legs syndrome) Continue ropinirole  2 mg p.o. bedtime.   I have seen and examined this patient myself. I have spent 34 minutes in her evaluation and care.  Subjective: The patient is resting comfortably. No new complaints.   Physical Exam: Vitals:   08/31/24 0417 08/31/24 0753 08/31/24 0934 08/31/24 1225  BP: (!) 147/65  116/61 112/60  Pulse: 60   70  Resp: 20   18  Temp: 98.2 F (36.8 C)   98 F (36.7 C)  TempSrc:    Oral  SpO2: 98% 97%  98%  Weight:      Height:       Exam:  Constitutional:  The patient is awake, alert, and oriented x 3. No acute distress. Eyes:  pupils and irises appear normal Normal lids and conjunctivae ENMT:  grossly normal hearing  Lips appear normal external ears, nose appear normal Oropharynx: mucosa, tongue,posterior pharynx appear normal Neck:  neck appears normal, no masses, normal ROM, supple no thyromegaly Respiratory:  No increased work of breathing. No wheezes, rales, or rhonchi No tactile fremitus Cardiovascular:  Regular rate and rhythm No murmurs, ectopy, or gallups. No lateral PMI. No thrills. Abdomen:  Abdomen is soft, non-tender, non-distended No hernias, masses, or organomegaly Normoactive bowel sounds.  Musculoskeletal:  No cyanosis, clubbing, or  edema Skin:  No rashes, lesions, ulcers palpation of skin: no induration or nodules Right lower extremity is erythematous and swollen.  Neurologic:  CN 2-12 intact Sensation all 4 extremities intact Psychiatric:  Mental status Mood, affect appropriate Orientation to person,  place, time  judgment and insight appear intact  Data Reviewed:  CBC, BMP  Family Communication: None available  Disposition: Status is: Inpatient Remains inpatient appropriate because: Need to continue to monitor blood cultures, administer IV fluids, and provide supportive care. The patient failed outpatient treatment.   Planned Discharge Destination: Home    Time spent: 38 minutes  Author: Chrisotpher Rivero, DO 08/31/2024 4:58 PM  For on call review [email or web address].

## 2024-08-31 NOTE — Plan of Care (Signed)
  Problem: Education: Goal: Knowledge of General Education information will improve Description: Including pain rating scale, medication(s)/side effects and non-pharmacologic comfort measures Outcome: Progressing   Problem: Health Behavior/Discharge Planning: Goal: Ability to manage health-related needs will improve Outcome: Progressing   Problem: Clinical Measurements: Goal: Ability to maintain clinical measurements within normal limits will improve Outcome: Progressing Goal: Will remain free from infection Outcome: Progressing Goal: Diagnostic test results will improve Outcome: Progressing   Problem: Activity: Goal: Risk for activity intolerance will decrease Outcome: Progressing   Problem: Nutrition: Goal: Adequate nutrition will be maintained Outcome: Progressing   Problem: Coping: Goal: Level of anxiety will decrease Outcome: Progressing   Problem: Pain Managment: Goal: General experience of comfort will improve and/or be controlled Outcome: Progressing   Problem: Safety: Goal: Ability to remain free from injury will improve Outcome: Progressing

## 2024-09-01 DIAGNOSIS — L03115 Cellulitis of right lower limb: Secondary | ICD-10-CM | POA: Diagnosis not present

## 2024-09-01 LAB — CBC WITH DIFFERENTIAL/PLATELET
Abs Immature Granulocytes: 0.01 K/uL (ref 0.00–0.07)
Basophils Absolute: 0 K/uL (ref 0.0–0.1)
Basophils Relative: 1 %
Eosinophils Absolute: 0.2 K/uL (ref 0.0–0.5)
Eosinophils Relative: 4 %
HCT: 33 % — ABNORMAL LOW (ref 36.0–46.0)
Hemoglobin: 10.3 g/dL — ABNORMAL LOW (ref 12.0–15.0)
Immature Granulocytes: 0 %
Lymphocytes Relative: 13 %
Lymphs Abs: 0.7 K/uL (ref 0.7–4.0)
MCH: 27.9 pg (ref 26.0–34.0)
MCHC: 31.2 g/dL (ref 30.0–36.0)
MCV: 89.4 fL (ref 80.0–100.0)
Monocytes Absolute: 0.7 K/uL (ref 0.1–1.0)
Monocytes Relative: 13 %
Neutro Abs: 4.1 K/uL (ref 1.7–7.7)
Neutrophils Relative %: 69 %
Platelets: 341 K/uL (ref 150–400)
RBC: 3.69 MIL/uL — ABNORMAL LOW (ref 3.87–5.11)
RDW: 14.8 % (ref 11.5–15.5)
WBC: 5.8 K/uL (ref 4.0–10.5)
nRBC: 0 % (ref 0.0–0.2)

## 2024-09-01 LAB — BASIC METABOLIC PANEL WITH GFR
Anion gap: 10 (ref 5–15)
BUN: 15 mg/dL (ref 8–23)
CO2: 23 mmol/L (ref 22–32)
Calcium: 8.4 mg/dL — ABNORMAL LOW (ref 8.9–10.3)
Chloride: 99 mmol/L (ref 98–111)
Creatinine, Ser: 0.35 mg/dL — ABNORMAL LOW (ref 0.44–1.00)
GFR, Estimated: >60 mL/min (ref >60)
Glucose, Bld: 76 mg/dL (ref 70–99)
Potassium: 3.9 mmol/L (ref 3.5–5.1)
Sodium: 132 mmol/L — ABNORMAL LOW (ref 135–145)

## 2024-09-01 LAB — T4, FREE: Free T4: 1.1 ng/dL (ref 0.61–1.12)

## 2024-09-01 LAB — TSH: TSH: 6.83 u[IU]/mL — ABNORMAL HIGH (ref 0.350–4.500)

## 2024-09-01 MED ORDER — LINEZOLID 600 MG PO TABS
600.0000 mg | ORAL_TABLET | Freq: Two times a day (BID) | ORAL | Status: DC
  Administered 2024-09-02 – 2024-09-03 (×3): 600 mg via ORAL
  Filled 2024-09-01 (×3): qty 1

## 2024-09-01 NOTE — Progress Notes (Signed)
 Physical Therapy Treatment Patient Details Name: Carolyn Henderson MRN: [id number] DOB: September 27, 1930 Today's Date: 09/01/2024   History of Present Illness 89 yo female presents to therapy following hospitalization on 08/30/2024 due to R LE laceration with worsening edema, calor, rubor and TTP with no changes with PO and topical ABX. Pt started on vancomycin for R LE cellulitis. Pt hospitalized July of this ear secondary to UTI. Pt PMH includes but is not limited to: arthritis, diverticulosis, herpes zoster, postherpetic neuralgia, HLD, HTN, diastolic dysfunction, PACs, OP, hypothyroidism, t-spine compression fx, sacral fx, sciatica, anemia, SIADH, GERD, SOB, B TKA and B THA, colon resection and back surgeries.    PT Comments  Pt admitted with above diagnosis.  Pt currently with functional limitations due to the deficits listed below (see PT Problem List). Pt in bed when PT returned in pm. Pt reported she was upset this am because she thought she was going to get to home, however MD recommended continued IV ABX. Pt agreeable to therapy intervention. Pt required S for supine <> sit, min cues for sit to stand  from EOB with S to RW, gait tasks in hallway with improved tolerance of 200 feet with RW with min cues for posture and proper distance from RW with S and slow cadence. Pt elected to return to bed and all needs in place. Pt will benefit from acute skilled PT to increase their independence and safety with mobility to allow discharge.      If plan is discharge home, recommend the following: A little help with walking and/or transfers;A little help with bathing/dressing/bathroom;Assistance with cooking/housework;Assist for transportation   Can travel by private vehicle        Equipment Recommendations  None recommended by PT    Recommendations for Other Services       Precautions / Restrictions Precautions Precautions: Fall Restrictions Weight Bearing Restrictions Per Provider Order: No      Mobility  Bed Mobility Overal bed mobility: Needs Assistance Bed Mobility: Supine to Sit, Sit to Supine     Supine to sit: Supervision, HOB elevated Sit to supine: Supervision   General bed mobility comments: min cues    Transfers Overall transfer level: Needs assistance Equipment used: Rolling walker (2 wheels) Transfers: Sit to/from Stand Sit to Stand: Supervision           General transfer comment: min cues for proper UE and AD placement with good recall    Ambulation/Gait Ambulation/Gait assistance: Supervision Gait Distance (Feet): 200 Feet Assistive device: Rolling walker (2 wheels) Gait Pattern/deviations: Step-through pattern, Trunk flexed Gait velocity: decreased     General Gait Details: pt required cues for posture, proper distance from RW and safety with turns   Stairs             Wheelchair Mobility     Tilt Bed    Modified Rankin (Stroke Patients Only)       Balance Overall balance assessment: No apparent balance deficits (not formally assessed) (pt reports one near fall resulting in R LE injury and now cellulitis)                                          Communication Communication Communication: No apparent difficulties  Cognition Arousal: Alert Behavior During Therapy: WFL for tasks assessed/performed   PT - Cognitive impairments: No apparent impairments  Following commands: Intact      Cueing    Exercises      General Comments General comments (skin integrity, edema, etc.): pt reprots R distal LE TTP and burning s/p gait, pt reports she and spouse have a sitter 4 days a wk and housekeeper every other week to help around the house      Pertinent Vitals/Pain Pain Assessment Pain Assessment: Faces Faces Pain Scale: Hurts little more Pain Location: R distal LE Pain Descriptors / Indicators: Discomfort, Burning, Constant Pain Intervention(s): Limited activity within  patient's tolerance, Monitored during session, Repositioned (elevated on pillow upon return to bed)    Home Living                          Prior Function            PT Goals (current goals can now be found in the care plan section) Acute Rehab PT Goals Patient Stated Goal: to be able to get over the infection and get back home PT Goal Formulation: With patient Time For Goal Achievement: 09/14/24 Potential to Achieve Goals: Good Progress towards PT goals: Progressing toward goals    Frequency    Min 3X/week      PT Plan      Co-evaluation              AM-PAC PT 6 Clicks Mobility   Outcome Measure  Help needed turning from your back to your side while in a flat bed without using bedrails?: A Little Help needed moving from lying on your back to sitting on the side of a flat bed without using bedrails?: A Little Help needed moving to and from a bed to a chair (including a wheelchair)?: A Little Help needed standing up from a chair using your arms (e.g., wheelchair or bedside chair)?: A Little Help needed to walk in hospital room?: A Little Help needed climbing 3-5 steps with a railing? : A Lot 6 Click Score: 17    End of Session Equipment Utilized During Treatment: Gait belt Activity Tolerance: Patient tolerated treatment well Patient left: in chair;with call bell/phone within reach Nurse Communication: Mobility status PT Visit Diagnosis: Unsteadiness on feet (R26.81);Other abnormalities of gait and mobility (R26.89);Difficulty in walking, not elsewhere classified (R26.2)     Time: 8653-8588 PT Time Calculation (min) (ACUTE ONLY): 25 min  Charges:    $Gait Training: 8-22 mins $Therapeutic Activity: 8-22 mins PT General Charges $$ ACUTE PT VISIT: 1 Visit                     [place], PT Acute Rehab    [place] VEAR Drone 09/01/2024, 2:29 PM

## 2024-09-01 NOTE — Plan of Care (Signed)

## 2024-09-01 NOTE — Progress Notes (Signed)
 Progress Note   Patient: Carolyn Henderson FMW:[id number] DOB: [birth year] DOA: 08/30/2024     2 DOS: the patient was seen and examined on 09/01/2024   Brief hospital course: Carolyn Henderson is a 89 y.o. female with medical history significant of arthritis, diverticulosis, herpes zoster, postherpetic neuralgia, hyperlipidemia, hypertension, grade 1 diastolic dysfunction, PACs, osteopenia, hypothyroidism, osteoporosis, thoracic vertebra compression fracture, closed sacral fracture, lumbar with sciatica, microcytic anemia, urticaria, eczema, seborrheic keratosis, B12 deficiency, history UTI, hyponatremia, SIADH who had a laceration to her lateral lower leg developing erythema, edema, calor and TTP that has not improved with a course of clindamycin  and also a course of Bactrim.   98.5 F, pulse 64, respirations 16, BP 128/62 mmHg O2 sat 98% on room air.  The patient was started on vancomycin. This has now been changed to linezolid.  The patient continues to complain of pain in her lower extremity, although the warmth and erythema of visible portionof leg is improved.    Lab work: CBC showed a white count of 5.9 with 77% neutrophils, hemoglobin 10.6 g/dL and platelets 679.  CMP showed a 131 mmol/L, the rest of the CMP measurements were normal after calcium  correction.   Imaging: Right tibia/fibula x-ray with no osseous findings or evidence of soft tissue emphysema.  Mild soft tissue edema which could reflect cellulitis.  The patient has been admitted to a med/surg bed. She is receiving IV Vancomyin. Blood cultures x 2 have been obtained on 08/30/2024. There has been no growth.  Assessment and Plan: Principal Problem:   Cellulitis of right lower extremity Admit to telemetry/inpatient. Td toxoid IM x 1. Analgesics as needed. Antiemetics as needed. Continue vancomycin per pharmacy. Blood culture has had no growth. Follow CBC and CMP in a.m. Improving interms of calor and rubor. Wound care  attending to wound.   Active Problems:   Hyponatremia Mild with unknown clinical significance. No longer on furosemide , no GI losses. Mild. Continue to monitor. Sodium is 132 on 09/01/2024.     Hypothyroidism TSH level was 5.2. No FT4. Likely euthyroid sick syndrome. Will recheck with FT4. Continue levothyroxine  112 mcg p.o. daily.     Hyperlipidemia Currently not on therapy. Might benefit from lifestyle modifications Follow-up with primary care provider.     Essential hypertension Continue amlodipine  2.5 mg p.o. daily.     Grade I diastolic dysfunction No signs of decompensation.     Normocytic anemia Monitor hematocrit and hemoglobin.     RLS (restless legs syndrome) Continue ropinirole  2 mg p.o. bedtime.   I have seen and examined this patient myself. I have spent 34 minutes in her evaluation and care.  Subjective: The patient is resting comfortably. No new complaints.   Physical Exam: Vitals:   08/31/24 0934 08/31/24 1225 08/31/24 2000 09/01/24 0503  BP: 116/61 112/60 137/70 (!) 130/59  Pulse:  70 78 63  Resp:  18 18 18   Temp:  98 F (36.7 C) 97.9 F (36.6 C) 98 F (36.7 C)  TempSrc:  Oral Oral Oral  SpO2:  98% 99% 94%  Weight:      Height:       Exam:  Constitutional:  The patient is awake, alert, and oriented x 3. No acute distress. Eyes:  pupils and irises appear normal Normal lids and conjunctivae ENMT:  grossly normal hearing  Lips appear normal external ears, nose appear normal Oropharynx: mucosa, tongue,posterior pharynx appear normal Neck:  neck appears normal, no masses, normal ROM, supple no thyromegaly Respiratory:  No increased work of breathing. No wheezes, rales, or rhonchi No tactile fremitus Cardiovascular:  Regular rate and rhythm No murmurs, ectopy, or gallups. No lateral PMI. No thrills. Abdomen:  Abdomen is soft, non-tender, non-distended No hernias, masses, or organomegaly Normoactive bowel sounds.  Musculoskeletal:   No cyanosis, clubbing, or edema Skin:  No rashes, lesions, ulcers palpation of skin: no induration or nodules Right lower extremity is erythematous and swollen.  Neurologic:  CN 2-12 intact Sensation all 4 extremities intact Psychiatric:  Mental status Mood, affect appropriate Orientation to person, place, time  judgment and insight appear intact  Data Reviewed:  CBC, BMP  Family Communication: None available  Disposition: Status is: Inpatient Remains inpatient appropriate because: Need to continue to monitor blood cultures, administer IV fluids, and provide supportive care. The patient failed outpatient treatment.   Planned Discharge Destination: Home    Time spent: 33 minutes  Author: Cordney Barstow, DO 09/01/2024 5:34 PM  For on call review [email or web address].

## 2024-09-01 NOTE — Plan of Care (Signed)

## 2024-09-01 NOTE — Progress Notes (Signed)
 PT Note  Patient Details Name: Carolyn Henderson MRN: [id number] DOB: September 14, 1930   Cancelled Treatment:    Reason Eval/Treat Not Completed: Other (comment). PT arrived 1206 and another care provider present. PT to return as schedule allows. PT to continue to follow acutely.   [place], PT Acute Rehab   [place] VEAR Drone 09/01/2024, 12:52 PM

## 2024-09-02 DIAGNOSIS — L03115 Cellulitis of right lower limb: Secondary | ICD-10-CM | POA: Diagnosis not present

## 2024-09-02 LAB — CBC WITH DIFFERENTIAL/PLATELET
Abs Immature Granulocytes: 0.01 K/uL (ref 0.00–0.07)
Basophils Absolute: 0.1 K/uL (ref 0.0–0.1)
Basophils Relative: 1 %
Eosinophils Absolute: 0.2 K/uL (ref 0.0–0.5)
Eosinophils Relative: 4 %
HCT: 36.3 % (ref 36.0–46.0)
Hemoglobin: 11.2 g/dL — ABNORMAL LOW (ref 12.0–15.0)
Immature Granulocytes: 0 %
Lymphocytes Relative: 15 %
Lymphs Abs: 0.8 K/uL (ref 0.7–4.0)
MCH: 28.1 pg (ref 26.0–34.0)
MCHC: 30.9 g/dL (ref 30.0–36.0)
MCV: 91 fL (ref 80.0–100.0)
Monocytes Absolute: 0.6 K/uL (ref 0.1–1.0)
Monocytes Relative: 11 %
Neutro Abs: 3.9 K/uL (ref 1.7–7.7)
Neutrophils Relative %: 69 %
Platelets: 378 K/uL (ref 150–400)
RBC: 3.99 MIL/uL (ref 3.87–5.11)
RDW: 14.8 % (ref 11.5–15.5)
WBC: 5.6 K/uL (ref 4.0–10.5)
nRBC: 0 % (ref 0.0–0.2)

## 2024-09-02 LAB — BASIC METABOLIC PANEL WITH GFR
Anion gap: 9 (ref 5–15)
BUN: 20 mg/dL (ref 8–23)
CO2: 24 mmol/L (ref 22–32)
Calcium: 8.6 mg/dL — ABNORMAL LOW (ref 8.9–10.3)
Chloride: 100 mmol/L (ref 98–111)
Creatinine, Ser: 0.46 mg/dL (ref 0.44–1.00)
GFR, Estimated: >60 mL/min (ref >60)
Glucose, Bld: 91 mg/dL (ref 70–99)
Potassium: 4.2 mmol/L (ref 3.5–5.1)
Sodium: 133 mmol/L — ABNORMAL LOW (ref 135–145)

## 2024-09-02 LAB — GLUCOSE, CAPILLARY: Glucose-Capillary: 100 mg/dL — ABNORMAL HIGH (ref 70–99)

## 2024-09-02 MED ORDER — DIPHENOXYLATE-ATROPINE 2.5-0.025 MG PO TABS
1.0000 | ORAL_TABLET | Freq: Four times a day (QID) | ORAL | Status: DC | PRN
  Filled 2024-09-02: qty 1

## 2024-09-02 MED ORDER — DIPHENOXYLATE-ATROPINE 2.5-0.025 MG PO TABS
2.0000 | ORAL_TABLET | Freq: Once | ORAL | Status: AC
  Administered 2024-09-02: 2 via ORAL

## 2024-09-02 NOTE — Progress Notes (Signed)
 Mobility Specialist - Progress Note   09/02/24 1400  Mobility  Activity Ambulated with assistance  Level of Assistance Contact guard assist, steadying assist  Assistive Device Front wheel walker  Distance Ambulated (ft) 180 ft  Range of Motion/Exercises Active  Activity Response Tolerated well  Mobility Referral Yes  Mobility visit 1 Mobility  Mobility Specialist Start Time (ACUTE ONLY) 1403  Mobility Specialist Stop Time (ACUTE ONLY) 1415  Mobility Specialist Time Calculation (min) (ACUTE ONLY) 12 min   Received in bed and agreed to mobility. Had no issues throughout session and returned to bed with all needs met.  Cyndee Ada Mobility Specialist

## 2024-09-02 NOTE — Progress Notes (Signed)
 Progress Note   Patient: Carolyn Henderson FMW:[id number] DOB: 1930/03/05 DOA: 08/30/2024     3 DOS: the patient was seen and examined on 09/02/2024   Brief hospital course: Carolyn Henderson is a 89 y.o. female with medical history significant of arthritis, diverticulosis, herpes zoster, postherpetic neuralgia, hyperlipidemia, hypertension, grade 1 diastolic dysfunction, PACs, osteopenia, hypothyroidism, osteoporosis, thoracic vertebra compression fracture, closed sacral fracture, lumbar with sciatica, microcytic anemia, urticaria, eczema, seborrheic keratosis, B12 deficiency, history UTI, hyponatremia, SIADH who had a laceration to her lateral lower leg developing erythema, edema, calor and TTP that has not improved with a course of clindamycin  and also a course of Bactrim.   98.5 F, pulse 64, respirations 16, BP 128/62 mmHg O2 sat 98% on room air.  The patient was started on vancomycin . This has now been changed to linezolid .  The wound on the patient's leg continues to appear improved. Erythema is diminishing.    Lab work: CBC showed a white count of 5.9 with 77% neutrophils, hemoglobin 10.6 g/dL and platelets 679.  CMP showed a 131 mmol/L, the rest of the CMP measurements were normal after calcium  correction.   Imaging: Right tibia/fibula x-ray with no osseous findings or evidence of soft tissue emphysema.  Mild soft tissue edema which could reflect cellulitis.  The patient has been admitted to a med/surg bed. She is receiving IV Vancomyin. Blood cultures x 2 have been obtained on 08/30/2024. There has been no growth.  Assessment and Plan: Principal Problem:   Cellulitis of right lower extremity Admit to telemetry/inpatient. Td toxoid IM x 1. Changed to Linozolid on 09/01/2024.  Blood culture has had no growth. Improving interms of calor and rubor. Wound appears clean and dry. Wound care attending to wound.   Active Problems:   Hyponatremia Mild with unknown clinical significance. No  longer on furosemide , no GI losses. Mild. Continue to monitor. Sodium is 133 on 09/01/2024.     Hypothyroidism TSH level was 6.8. FT4 is 1.10. Euthyroid sick syndrome. Continue levothyroxine  112 mcg p.o. daily.     Hyperlipidemia Currently not on therapy. Might benefit from lifestyle modifications Follow-up with primary care provider.     Essential hypertension Continue amlodipine  2.5 mg p.o. daily.     Grade I diastolic dysfunction No signs of decompensation.     Normocytic anemia Monitor hematocrit and hemoglobin.     RLS (restless legs syndrome) Continue ropinirole  2 mg p.o. bedtime.   I have seen and examined this patient myself. I have spent 36 minutes in her evaluation and care.  Subjective: The patient is resting comfortably. No new complaints.   Physical Exam: Vitals:   09/01/24 1927 09/02/24 0352 09/02/24 0748 09/02/24 1429  BP: (!) 115/94 (!) 140/67  (!) 112/57  Pulse: 88 66  78  Resp: 15 16  18   Temp: 98.3 F (36.8 C) 98.3 F (36.8 C)  97.6 F (36.4 C)  TempSrc: Oral     SpO2: 97% 95% 95%   Weight:      Height:       Exam:  Constitutional:  The patient is awake, alert, and oriented x 3. No acute distress. Eyes:  pupils and irises appear normal Normal lids and conjunctivae ENMT:  grossly normal hearing  Lips appear normal external ears, nose appear normal Oropharynx: mucosa, tongue,posterior pharynx appear normal Neck:  neck appears normal, no masses, normal ROM, supple no thyromegaly Respiratory:  No increased work of breathing. No wheezes, rales, or rhonchi No tactile fremitus Cardiovascular:  Regular  rate and rhythm No murmurs, ectopy, or gallups. No lateral PMI. No thrills. Abdomen:  Abdomen is soft, non-tender, non-distended No hernias, masses, or organomegaly Normoactive bowel sounds.  Musculoskeletal:  No cyanosis, clubbing, or edema Skin:  No rashes, lesions, ulcers palpation of skin: no induration or nodules Right lower  extremity is erythematous and swollen.  Wound appears clean and dry. Neurologic:  CN 2-12 intact Sensation all 4 extremities intact Psychiatric:  Mental status Mood, affect appropriate Orientation to person, place, time  judgment and insight appear intact  Data Reviewed:  CBC, BMP  Family Communication: None available  Disposition: Status is: Inpatient Remains inpatient appropriate because: Need to continue to monitor blood cultures, administer IV fluids, and provide supportive care. The patient failed outpatient treatment.   Planned Discharge Destination: Home    Time spent: 35 minutes  Author: Zaiden Ludlum, DO 09/02/2024 4:46 PM  For on call review [email or web address].

## 2024-09-02 NOTE — Plan of Care (Signed)
  Problem: Clinical Measurements: Goal: Ability to maintain clinical measurements within normal limits will improve Outcome: Progressing   Problem: Clinical Measurements: Goal: Will remain free from infection Outcome: Progressing   Problem: Activity: Goal: Risk for activity intolerance will decrease Outcome: Progressing   Problem: Nutrition: Goal: Adequate nutrition will be maintained Outcome: Progressing   Problem: Pain Managment: Goal: General experience of comfort will improve and/or be controlled Outcome: Progressing

## 2024-09-02 NOTE — TOC Initial Note (Signed)
 Transition of Care [facility]) - Initial/Assessment Note    Patient Details  Name: Carolyn Henderson MRN: [id number] Date of Birth: [birth year]  Transition of Care [facility]) CM/SW Contact:    Tawni CHRISTELLA Eva, LCSW Phone Number: 09/02/2024, 11:05 AM  Clinical Narrative:                  CSW spoke with the pt's daughter Charlies to discuss discharge planning. Pt resides at [facility] with her spouse. Per the pt's daughter, the pt has home aide services for 4 hours a day, 4 times a week. She reported that the pt uses a walker at baseline. The pt's home is handicap accessible, and she has no DME needs at this time.  CSW discussed PT's recommendation for home health services. The pt's daughter selected [facility], as the pt has used this agency in the past. [facility] has accepted the pt for home health services. P's daughter reported that she will provide transportation home upon discharge.  Expected Discharge Plan: Home w Home Health Services Barriers to Discharge: Continued Medical Work up   Patient Goals and CMS Choice Patient states their goals for this hospitalization and ongoing recovery are:: retrun home with home health services CMS Medicare.gov Compare Post Acute Care list provided to:: Patient Represenative (must comment) Choice offered to / list presented to : Adult Children      Expected Discharge Plan and Services       Living arrangements for the past 2 months: Independent Living Facility                           HH Arranged: PT, OT HH Agency: [facility] Date [facility] Agency Contacted: 09/02/24      Prior Living Arrangements/Services Living arrangements for the past 2 months: Independent Living Facility Lives with:: Self, Spouse Patient language and need for interpreter reviewed:: Yes Do you feel safe going back to the place where you live?: Yes          Current home services: DME, Homehealth aide Criminal Activity/Legal Involvement  Pertinent to Current Situation/Hospitalization: No - Comment as needed  Activities of Daily Living   ADL Screening (condition at time of admission) Independently performs ADLs?: No Does the patient have a NEW difficulty with bathing/dressing/toileting/self-feeding that is expected to last >3 days?: No Does the patient have a NEW difficulty with getting in/out of bed, walking, or climbing stairs that is expected to last >3 days?: No Does the patient have a NEW difficulty with communication that is expected to last >3 days?: No Is the patient deaf or have difficulty hearing?: Yes Does the patient have difficulty seeing, even when wearing glasses/contacts?: No Does the patient have difficulty concentrating, remembering, or making decisions?: No  Permission Sought/Granted                  Emotional Assessment              Admission diagnosis:  Cellulitis of right lower extremity [L03.115] Patient Active Problem List   Diagnosis Date Noted   Compression fracture of T11 vertebra (HCC) 08/30/2024   Greater trochanteric pain syndrome 08/30/2024   Sun-damaged skin 08/30/2024   Cellulitis of right lower extremity 08/30/2024   Normocytic anemia 08/30/2024   Acute UTI (urinary tract infection) 04/25/2024   Microcytosis 04/25/2024   Grade I diastolic dysfunction 04/25/2024   Iron deficiency anemia due to chronic blood loss 04/25/2024   Cobalamin deficiency 05/01/2022  Contusion of rib 05/01/2022   Hematoma of scalp 05/01/2022   Pruritic rash 03/06/2022   Urticaria 02/16/2022   Antibiotic-associated diarrhea 01/08/2022   Diarrhea 01/08/2022   Expiratory wheezing 01/02/2022   Seasonal allergic rhinitis 01/02/2022   Seborrheic keratoses 12/27/2021   Urinary incontinence 03/15/2017   PAC (premature atrial contraction) 06/19/2016   Lumbago with sciatica 12/28/2015   RLS (restless legs syndrome) 01/13/2015   Hyponatremia 10/10/2014   Sacral fracture, closed (HCC) 10/08/2014    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   Osteopenia 02/07/2012   CHRONIC LARYNGITIS 09/26/2010   DYSPRAXIA 05/04/2010   WEAKNESS 05/04/2010   Postherpetic neuralgia 02/17/2010   Herpes zoster 01/23/2010   ECZEMA 09/12/2009   Eczema 09/12/2009   Hypothyroidism 03/10/2009   Osteoarthritis 03/10/2009   Hyperlipidemia 03/04/2009   Essential hypertension 03/04/2009   PCP:  Othelia Delon PARAS, FNP Pharmacy:   [facility] [address], [place] - [address] [place] [address] Phone: [phone] Fax: [phone]  [facility] [address] [place] [address] Phone: [phone] Fax: [phone]  [id number] [place], [place] - [address] [place] [address] Phone: [phone] Fax: [phone]     Social Drivers of Health (SDOH) Social History: SDOH Screenings   Food Insecurity: No Food Insecurity (08/30/2024)  Housing: Unknown (08/30/2024)  Transportation Needs: No Transportation Needs (08/30/2024)  Utilities: Not At Risk (08/30/2024)  Financial Resource Strain: Low Risk  (10/31/2023)   Received from [facility]  Physical Activity: Unknown (08/12/2023)   Received from [facility]  Recent Concern: Physical Activity - Inactive (08/12/2023)   Received from [facility]  Social Connections: Moderately Integrated (08/30/2024)  Stress: No Stress Concern Present (08/12/2023)   Received from [facility]  Tobacco Use: Medium Risk (08/30/2024)   SDOH Interventions:     Readmission Risk Interventions    04/27/2024   10:20 AM  Readmission Risk Prevention Plan  Post Dischage Appt Complete  Medication Screening Complete  Transportation Screening Complete

## 2024-09-02 NOTE — Plan of Care (Signed)

## 2024-09-03 LAB — CBC WITH DIFFERENTIAL/PLATELET
Abs Immature Granulocytes: 0.01 K/uL (ref 0.00–0.07)
Basophils Absolute: 0 K/uL (ref 0.0–0.1)
Basophils Relative: 1 %
Eosinophils Absolute: 0.3 K/uL (ref 0.0–0.5)
Eosinophils Relative: 6 %
HCT: 34 % — ABNORMAL LOW (ref 36.0–46.0)
Hemoglobin: 10.3 g/dL — ABNORMAL LOW (ref 12.0–15.0)
Immature Granulocytes: 0 %
Lymphocytes Relative: 12 %
Lymphs Abs: 0.7 K/uL (ref 0.7–4.0)
MCH: 27.8 pg (ref 26.0–34.0)
MCHC: 30.3 g/dL (ref 30.0–36.0)
MCV: 91.9 fL (ref 80.0–100.0)
Monocytes Absolute: 0.6 K/uL (ref 0.1–1.0)
Monocytes Relative: 11 %
Neutro Abs: 4 K/uL (ref 1.7–7.7)
Neutrophils Relative %: 70 %
Platelets: 373 K/uL (ref 150–400)
RBC: 3.7 MIL/uL — ABNORMAL LOW (ref 3.87–5.11)
RDW: 14.8 % (ref 11.5–15.5)
WBC: 5.6 K/uL (ref 4.0–10.5)
nRBC: 0 % (ref 0.0–0.2)

## 2024-09-03 LAB — BASIC METABOLIC PANEL WITH GFR
Anion gap: 7 (ref 5–15)
BUN: 16 mg/dL (ref 8–23)
CO2: 24 mmol/L (ref 22–32)
Calcium: 8.6 mg/dL — ABNORMAL LOW (ref 8.9–10.3)
Chloride: 101 mmol/L (ref 98–111)
Creatinine, Ser: 0.52 mg/dL (ref 0.44–1.00)
GFR, Estimated: >60 mL/min (ref >60)
Glucose, Bld: 89 mg/dL (ref 70–99)
Potassium: 4.1 mmol/L (ref 3.5–5.1)
Sodium: 132 mmol/L — ABNORMAL LOW (ref 135–145)

## 2024-09-03 MED ORDER — LINEZOLID 600 MG PO TABS: 600.0000 mg | ORAL_TABLET | Freq: Two times a day (BID) | ORAL | 0 refills | Status: AC

## 2024-09-03 NOTE — Progress Notes (Signed)
 Physical Therapy Treatment Patient Details Name: Carolyn Henderson MRN: [id number] DOB: November 07, 1929 Today's Date: 09/03/2024   History of Present Illness 89 yo female presents to therapy following hospitalization on 08/30/2024 due to R LE laceration with worsening edema, calor, rubor and TTP with no changes with PO and topical ABX. Pt started on vancomycin for R LE cellulitis. Pt hospitalized July of this ear secondary to UTI. Pt PMH includes but is not limited to: arthritis, diverticulosis, herpes zoster, postherpetic neuralgia, HLD, HTN, diastolic dysfunction, PACs, OP, hypothyroidism, t-spine compression fx, sacral fx, sciatica, anemia, SIADH, GERD, SOB, B TKA and B THA, colon resection and back surgeries.    PT Comments  AxO x 3 [employer] Lady.  Highly motivated.  Lives with Spouse at [facility].  Amb with a walker as baseline.  Hopes to D/C to home today.   Assisted OOB to amb to the bathroom then in hallway went well.  Pt appears at prior level of mobility.  LPT has rec HH PT at her [facility] Living Facility.  Pt has a walker at home.     If plan is discharge home, recommend the following: A little help with walking and/or transfers;A little help with bathing/dressing/bathroom;Assistance with cooking/housework;Assist for transportation   Can travel by private vehicle        Equipment Recommendations  None recommended by PT    Recommendations for Other Services       Precautions / Restrictions       Mobility  Bed Mobility Overal bed mobility: Needs Assistance Bed Mobility: Supine to Sit     Supine to sit: Supervision, HOB elevated     General bed mobility comments: self able with increased time    Transfers Overall transfer level: Needs assistance Equipment used: Rolling walker (2 wheels) Transfers: Sit to/from Stand Sit to Stand: Supervision           General transfer comment: self able with increased time.  Good safety cognition and use of hands to steady  self.  Self able with toileting including donning/doffing her depends, self peri care and wash hands.  Good, steady balance.    Ambulation/Gait Ambulation/Gait assistance: Supervision Gait Distance (Feet): 115 Feet Assistive device: Rolling walker (2 wheels) Gait Pattern/deviations: Step-through pattern, Trunk flexed Gait velocity: decreased     General Gait Details: tolerated a functional distance with walker Supervision with good safety cognition and safe with her turns/back steps.   Stairs             Wheelchair Mobility     Tilt Bed    Modified Rankin (Stroke Patients Only)       Balance                                            Communication    Cognition Arousal: Alert     PT - Cognitive impairments: No apparent impairments                       PT - Cognition Comments: AxO x 3 Sweet Lady.  Highly motivated.  Lives with Spouse at [facility].  Amb with a walker as baseline.  Hopes to D/C to home today.        Cueing    Exercises      General Comments        Pertinent  Vitals/Pain      Home Living                          Prior Function            PT Goals (current goals can now be found in the care plan section) Progress towards PT goals: Progressing toward goals    Frequency    Min 3X/week      PT Plan      Co-evaluation              AM-PAC PT 6 Clicks Mobility   Outcome Measure  Help needed turning from your back to your side while in a flat bed without using bedrails?: None Help needed moving from lying on your back to sitting on the side of a flat bed without using bedrails?: None Help needed moving to and from a bed to a chair (including a wheelchair)?: None Help needed standing up from a chair using your arms (e.g., wheelchair or bedside chair)?: None Help needed to walk in hospital room?: None Help needed climbing 3-5 steps with a railing? : None 6 Click  Score: 24    End of Session Equipment Utilized During Treatment: Gait belt Activity Tolerance: Patient tolerated treatment well Patient left: in chair;with call bell/phone within reach Nurse Communication: Mobility status PT Visit Diagnosis: Unsteadiness on feet (R26.81);Other abnormalities of gait and mobility (R26.89);Difficulty in walking, not elsewhere classified (R26.2)     Time: 8858-8795 PT Time Calculation (min) (ACUTE ONLY): 23 min  Charges:    $Gait Training: 8-22 mins $Therapeutic Activity: 8-22 mins PT General Charges $$ ACUTE PT VISIT: 1 Visit                     Katheryn Leap  PTA Acute  Rehabilitation Services Office M-F          [phone]

## 2024-09-03 NOTE — TOC Transition Note (Signed)
 Transition of Care [facility]) - Discharge Note   Patient Details  Name: Carolyn Henderson MRN: [id number] Date of Birth: [birth year]  Transition of Care [facility]) CM/SW Contact:  Sonda Manuella Quill, RN Phone Number: 09/03/2024, 11:34 AM   Clinical Narrative:    D/C and HHPT/RN orders received; pt active w/ Marchelle Slough at agency given notification of orders; no IP CM needs.   Final next level of care: Home w Home Health Services Barriers to Discharge: No Barriers Identified   Patient Goals and CMS Choice Patient states their goals for this hospitalization and ongoing recovery are:: retrun home with home health services CMS Medicare.gov Compare Post Acute Care list provided to:: Patient Represenative (must comment) Choice offered to / list presented to : Adult Children      Discharge Placement                       Discharge Plan and Services Additional resources added to the After Visit Summary for                            [facility] Arranged: PT, OT HH Agency: [facility] Date [facility] Agency Contacted: 09/02/24      Social Drivers of Health (SDOH) Interventions SDOH Screenings   Food Insecurity: No Food Insecurity (08/30/2024)  Housing: Unknown (08/30/2024)  Transportation Needs: No Transportation Needs (08/30/2024)  Utilities: Not At Risk (08/30/2024)  Financial Resource Strain: Low Risk  (10/31/2023)   Received from [facility]  Physical Activity: Unknown (08/12/2023)   Received from [facility]  Recent Concern: Physical Activity - Inactive (08/12/2023)   Received from [facility]  Social Connections: Moderately Integrated (08/30/2024)  Stress: No Stress Concern Present (08/12/2023)   Received from [facility]  Tobacco Use: Medium Risk (08/30/2024)     Readmission Risk Interventions    04/27/2024   10:20 AM  Readmission Risk Prevention Plan  Post Dischage Appt Complete  Medication Screening Complete  Transportation Screening Complete

## 2024-09-03 NOTE — Plan of Care (Signed)
  Problem: Clinical Measurements: Goal: Will remain free from infection Outcome: Progressing   Problem: Activity: Goal: Risk for activity intolerance will decrease Outcome: Progressing   Problem: Nutrition: Goal: Adequate nutrition will be maintained Outcome: Progressing   Problem: Coping: Goal: Level of anxiety will decrease Outcome: Progressing   Problem: Elimination: Goal: Will not experience complications related to bowel motility Outcome: Progressing Goal: Will not experience complications related to urinary retention Outcome: Progressing   Problem: Pain Managment: Goal: General experience of comfort will improve and/or be controlled Outcome: Progressing   Problem: Safety: Goal: Ability to remain free from injury will improve Outcome: Progressing   Problem: Skin Integrity: Goal: Risk for impaired skin integrity will decrease Outcome: Progressing

## 2024-09-04 LAB — CULTURE, BLOOD (ROUTINE X 2)
Culture: NO GROWTH
Culture: NO GROWTH
Special Requests: ADEQUATE

## 2024-09-09 NOTE — Discharge Summary (Signed)
 Physician Discharge Summary   Patient: Carolyn Henderson MRN: [id number] DOB: 09/13/1930  Admit date:     08/30/2024  Discharge date: 09/03/2024  Discharge Physician: Brigida Bureau   PCP: Othelia Delon PARAS, FNP   Recommendations at discharge:    Discharge to home Complete antibiotics Follow up with PCP in 7-10 days.  Discharge Diagnoses: Principal Problem:   Cellulitis of right lower extremity Active Problems:   Hypothyroidism   Hyperlipidemia   Essential hypertension   Hyponatremia   RLS (restless legs syndrome)   Grade I diastolic dysfunction   Normocytic anemia  Resolved Problems:   * No resolved hospital problems. *  Hospital Course: Carolyn Henderson is a 89 y.o. female with medical history significant of arthritis, diverticulosis, herpes zoster, postherpetic neuralgia, hyperlipidemia, hypertension, grade 1 diastolic dysfunction, PACs, osteopenia, hypothyroidism, osteoporosis, thoracic vertebra compression fracture, closed sacral fracture, lumbar with sciatica, microcytic anemia, urticaria, eczema, seborrheic keratosis, B12 deficiency, history UTI, hyponatremia, SIADH who had a laceration to her lateral lower leg developing erythema, edema, calor and TTP that has not improved with a course of clindamycin  and also a course of Bactrim.   98.5 F, pulse 64, respirations 16, BP 128/62 mmHg O2 sat 98% on room air.  The patient was started on vancomycin . This has now been changed to linezolid .   The wound on the patient's leg continues to appear improved. Erythema is diminishing.    Lab work: CBC showed a white count of 5.9 with 77% neutrophils, hemoglobin 10.6 g/dL and platelets 679.  CMP showed a 131 mmol/L, the rest of the CMP measurements were normal after calcium  correction.   Imaging: Right tibia/fibula x-ray with no osseous findings or evidence of soft tissue emphysema.  Mild soft tissue edema which could reflect cellulitis.   The patient has been admitted to a med/surg bed.  She is receiving IV Vancomyin. Blood cultures x 2 have been obtained on 08/30/2024. There has been no growth.   Her wound and leg are looking much better. She will be discharged to home in fair condition with home health.  Assessment and Plan: Cellulitis of right lower extremity Admit to telemetry/inpatient. Td toxoid IM x 1. Changed to Linozolid on 09/01/2024.  Blood culture has had no growth. Improving interms of calor and rubor. Wound appears clean and dry. Wound care attending to wound.   Active Problems:   Hyponatremia Mild with unknown clinical significance. No longer on furosemide , no GI losses. Mild. Continue to monitor. Sodium is 133 on 09/01/2024.     Hypothyroidism TSH level was 6.8. FT4 is 1.10. Euthyroid sick syndrome. Continue levothyroxine  112 mcg p.o. daily.     Hyperlipidemia Currently not on therapy. Might benefit from lifestyle modifications Follow-up with primary care provider.     Essential hypertension Continue amlodipine  2.5 mg p.o. daily.     Grade I diastolic dysfunction No signs of decompensation.     Normocytic anemia Monitor hematocrit and hemoglobin.     RLS (restless legs syndrome) Continue ropinirole  2 mg p.o. bedtime.   I have seen and examined this patient myself. I have spent 34 minutes in her evaluation and care.  Consultants: None Procedures performed: None  Disposition: Home health Diet recommendation:  Discharge Diet Orders (From admission, onward)     Start     Ordered   09/03/24 0000  Diet - low sodium heart healthy        09/03/24 1130           Cardiac diet DISCHARGE  MEDICATION: Allergies as of 09/03/2024       Reactions   Cephalosporins Other (See Comments)   Unknown reaction    Codeine Sulfate Nausea And Vomiting   Hydrocodone  Itching   Percocet [oxycodone -acetaminophen ] Nausea And Vomiting   Tramadol  Nausea Only   Penicillins Swelling, Rash        Medication List     STOP taking these medications     clindamycin  300 MG capsule Commonly known as: CLEOCIN    furosemide  20 MG tablet Commonly known as: LASIX    sulfamethoxazole-trimethoprim 800-160 MG tablet Commonly known as: BACTRIM DS       TAKE these medications    acetaminophen  500 MG tablet Commonly known as: TYLENOL  Take 1,000 mg by mouth daily.   albuterol  108 (90 Base) MCG/ACT inhaler Commonly known as: VENTOLIN  HFA Inhale 1 puff into the lungs every 6 (six) hours as needed for wheezing or shortness of breath.   amLODipine  2.5 MG tablet Commonly known as: NORVASC  Take 1 tablet (2.5 mg total) by mouth daily.   budesonide-formoterol 80-4.5 MCG/ACT inhaler Commonly known as: SYMBICORT Inhale 2 puffs into the lungs 2 (two) times daily.   CALCIUM  1200 PO Take 1 tablet by mouth in the morning and at bedtime.   clotrimazole -betamethasone  cream Commonly known as: Lotrisone  Apply 1 application topically 2 (two) times daily. What changed:  when to take this reasons to take this   CRANBERRY PO Take 2 capsules by mouth daily.   cyanocobalamin  1000 MCG/ML injection Commonly known as: VITAMIN B12 Inject 1,000 mcg into the muscle every 30 (thirty) days.   dicyclomine  10 MG capsule Commonly known as: BENTYL  Take 10 mg by mouth 4 (four) times daily as needed for spasms.   diphenhydrAMINE  25 MG tablet Commonly known as: BENADRYL  Take 25 mg by mouth daily as needed for itching or allergies.   diphenoxylate -atropine  2.5-0.025 MG tablet Commonly known as: LOMOTIL  Take 1 tablet by mouth daily as needed for diarrhea or loose stools.   ferrous sulfate  325 (65 FE) MG EC tablet Take 325 mg by mouth daily.   fexofenadine 180 MG tablet Commonly known as: ALLEGRA Take 180 mg by mouth every evening.   fluocinonide 0.05 % external solution Commonly known as: LIDEX Apply 1 Application topically daily as needed (bumps on scalp).   gabapentin  300 MG capsule Commonly known as: NEURONTIN  TAKE 1 TO 3 CAPSULES BY MOUTH  EVERY DAY AS DIRECTED What changed:  how much to take how to take this when to take this additional instructions   GENTEAL OP Apply 1 drop to eye at bedtime.   nystatin powder Commonly known as: MYCOSTATIN/NYSTOP Apply 1 Application topically daily as needed (rash/itching).   PreserVision AREDS 2 Caps Take 1 capsule by mouth 2 (two) times daily.   PROBIOTIC PO Take 1 capsule by mouth every evening.   rOPINIRole  2 MG tablet Commonly known as: REQUIP  Take 2 mg by mouth at bedtime.   Synthroid  112 MCG tablet Generic drug: levothyroxine  Take 112 mcg by mouth daily before breakfast.   SYSTANE OP Apply 1 drop to eye in the morning and at bedtime.   Vitamin D  50 MCG (2000 UT) Caps Take 2,000 Units by mouth daily.       ASK your doctor about these medications    linezolid  600 MG tablet Commonly known as: ZYVOX  Take 1 tablet (600 mg total) by mouth every 12 (twelve) hours for 5 days. Ask about: Should I take this medication?  Discharge Care Instructions  (From admission, onward)           Start     Ordered   09/03/24 0000  Discharge wound care:       Comments: Dry dressing to be changed daily or when soiled.   09/03/24 1130            Contact information for after-discharge care     Home Medical Care     [facility]) .   Service: Home Health Services Contact information: [phone] [address] [facility] [place]  [address] [phone]                    Discharge Exam: Filed Weights   08/30/24 0949 08/30/24 1545  Weight: 65.3 kg 64.2 kg   Exam:  Constitutional:  The patient is awake, alert, and oriented x 3. No acute distress. Eyes:  pupils and irises appear normal Normal lids and conjunctivae ENMT:  grossly normal hearing  Lips appear normal external ears, nose appear normal Oropharynx: mucosa, tongue,posterior pharynx appear normal Neck:  neck appears normal, no masses, normal  ROM, supple no thyromegaly Respiratory:  No increased work of breathing. No wheezes, rales, or rhonchi No tactile fremitus Cardiovascular:  Regular rate and rhythm No murmurs, ectopy, or gallups. No lateral PMI. No thrills. Abdomen:  Abdomen is soft, non-tender, non-distended No hernias, masses, or organomegaly Normoactive bowel sounds.  Musculoskeletal:  No cyanosis, clubbing, or edema Wound and erythema on the patient's right lower extremity are improved. Skin:  No rashes, lesions, ulcers palpation of skin: no induration or nodules Neurologic:  CN 2-12 intact Sensation all 4 extremities intact Psychiatric:  Mental status Mood, affect appropriate Orientation to person, place, time  judgment and insight appear intact   Condition at discharge: fair  The results of significant diagnostics from this hospitalization (including imaging, microbiology, ancillary and laboratory) are listed below for reference.   Imaging Studies: DG Tibia/Fibula Right Result Date: 08/30/2024 CLINICAL DATA:  Fall 10 days ago with lower leg laceration Pred increased swelling and erythema since last night. Cellulitis. EXAM: RIGHT TIBIA AND FIBULA - 2 VIEW COMPARISON:  Ankle radiographs 05/21/2022. FINDINGS: The mineralization and alignment are normal. There is no evidence of acute fracture or dislocation. Mild tricompartmental degenerative changes at the knee. No significant ankle arthropathy identified. There is generalized mild soft tissue edema without evidence of foreign body or soft tissue emphysema. Scattered vascular calcifications noted. IMPRESSION: No acute osseous findings or evidence of soft tissue emphysema. Mild soft tissue edema which could reflect cellulitis. Electronically Signed   By: Elsie Perone M.D.   On: 08/30/2024 11:23    Microbiology: Results for orders placed or performed during the hospital encounter of 08/30/24  Culture, blood (routine x 2)     Status: None   Collection  Time: 08/30/24 10:50 AM   Specimen: BLOOD  Result Value Ref Range Status   Specimen Description   Final    BLOOD LEFT ANTECUBITAL Performed at [facility], [address], [place], [place] [address]    Special Requests   Final    BOTTLES DRAWN AEROBIC AND ANAEROBIC Blood Culture adequate volume Performed at [facility], [address] [place], [place] [address]    Culture   Final    NO GROWTH 5 DAYS Performed at [facility], [address] [place], [place] [address]    Report Status 09/04/2024 FINAL  Final  Culture, blood (routine  x 2)     Status: None   Collection Time: 08/30/24 11:08 AM   Specimen: BLOOD  Result Value Ref Range Status   Specimen Description   Final    BLOOD BLOOD LEFT HAND Performed at [facility], [address] [place], [place] [address]    Special Requests   Final    BOTTLES DRAWN AEROBIC ONLY Blood Culture results may not be optimal due to an inadequate volume of blood received in culture bottles Performed at [facility], [address], [place], [place] [address]    Culture   Final    NO GROWTH 5 DAYS Performed at [facility], [address], [place], [place] [address]    Report Status 09/04/2024 FINAL  Final    Labs: CBC: Recent Labs  Lab 09/03/24 0640  WBC 5.6  NEUTROABS 4.0  HGB 10.3*  HCT 34.0*  MCV 91.9  PLT 373   Basic Metabolic Panel: Recent Labs  Lab 09/03/24 0640  NA 132*  K 4.1  CL 101  CO2 24  GLUCOSE 89  BUN 16  CREATININE 0.52  CALCIUM  8.6*   Liver Function Tests: No results for input(s): AST, ALT, ALKPHOS, BILITOT, PROT, ALBUMIN in the last 168 hours. CBG: Recent Labs  Lab 09/02/24 2017  GLUCAP 100*    Discharge time spent: greater than 30 minutes.  Signed: Brigida Bureau, DO [facility] 09/09/2024

## 2024-09-19 ENCOUNTER — Emergency Department (HOSPITAL_COMMUNITY)
Admission: EM | Admit: 2024-09-19 | Discharge: 2024-09-20 | Disposition: A | Attending: Emergency Medicine | Admitting: Emergency Medicine

## 2024-09-19 ENCOUNTER — Encounter (HOSPITAL_COMMUNITY): Payer: Self-pay

## 2024-09-19 ENCOUNTER — Other Ambulatory Visit: Payer: Self-pay

## 2024-09-19 DIAGNOSIS — I10 Essential (primary) hypertension: Secondary | ICD-10-CM | POA: Insufficient documentation

## 2024-09-19 DIAGNOSIS — Z79899 Other long term (current) drug therapy: Secondary | ICD-10-CM | POA: Insufficient documentation

## 2024-09-19 DIAGNOSIS — L03115 Cellulitis of right lower limb: Secondary | ICD-10-CM | POA: Insufficient documentation

## 2024-09-19 DIAGNOSIS — R7989 Other specified abnormal findings of blood chemistry: Secondary | ICD-10-CM | POA: Insufficient documentation

## 2024-09-19 LAB — BASIC METABOLIC PANEL WITH GFR
Anion gap: 10 (ref 5–15)
BUN: 16 mg/dL (ref 8–23)
CO2: 26 mmol/L (ref 22–32)
Calcium: 9.1 mg/dL (ref 8.9–10.3)
Chloride: 97 mmol/L — ABNORMAL LOW (ref 98–111)
Creatinine, Ser: 0.49 mg/dL (ref 0.44–1.00)
GFR, Estimated: >60 mL/min (ref >60)
Glucose, Bld: 89 mg/dL (ref 70–99)
Potassium: 3.9 mmol/L (ref 3.5–5.1)
Sodium: 132 mmol/L — ABNORMAL LOW (ref 135–145)

## 2024-09-19 LAB — CBC
HCT: 36.5 % (ref 36.0–46.0)
Hemoglobin: 11.4 g/dL — ABNORMAL LOW (ref 12.0–15.0)
MCH: 28 pg (ref 26.0–34.0)
MCHC: 31.2 g/dL (ref 30.0–36.0)
MCV: 89.7 fL (ref 80.0–100.0)
Platelets: 398 K/uL (ref 150–400)
RBC: 4.07 MIL/uL (ref 3.87–5.11)
RDW: 15.2 % (ref 11.5–15.5)
WBC: 5.8 K/uL (ref 4.0–10.5)
nRBC: 0 % (ref 0.0–0.2)

## 2024-09-19 LAB — D-DIMER, QUANTITATIVE: D-Dimer, Quant: 2.62 ug{FEU}/mL — ABNORMAL HIGH (ref 0.00–0.50)

## 2024-09-19 LAB — I-STAT CG4 LACTIC ACID, ED: Lactic Acid, Venous: 1.4 mmol/L (ref 0.5–1.9)

## 2024-09-19 MED ORDER — LACTATED RINGERS IV BOLUS
500.0000 mL | Freq: Once | INTRAVENOUS | Status: AC
  Administered 2024-09-19: 500 mL via INTRAVENOUS

## 2024-09-19 MED ORDER — DEXTROSE 5 % IV SOLN
1500.0000 mg | Freq: Once | INTRAVENOUS | Status: AC
  Administered 2024-09-19: 1500 mg via INTRAVENOUS
  Filled 2024-09-19: qty 75

## 2024-09-19 NOTE — Discharge Instructions (Addendum)
 You were given a dose of IV antibiotics today.  Continue the oral antibiotics.  D-dimer test was also elevated so I recommend you follow-up and have the ultrasound performed as we discussed.  Return to the emergency room for fevers, chest pain, shortness of breath, increasing swelling or other concerning symptoms  IMPORTANT PATIENT INSTRUCTIONS: You have been scheduled for an Outpatient Vascular Study at [facility].  If tomorrow is a Saturday, Sunday or holiday, please go to the [facility] Emergency Department Registration Desk at 11 am tomorrow morning and tell them you are there for a vascular study.  If tomorrow is a weekday (Monday-Friday), please go to the [facility] (address [address], [place]) at 8 am and report to the 4th floor registration Zone A.  Inform registration that you are there for a vascular study.

## 2024-09-19 NOTE — ED Notes (Signed)
 Patient is resting comfortably.

## 2024-09-19 NOTE — ED Provider Notes (Signed)
 [place] EMERGENCY DEPARTMENT AT [facility] Provider Note   CSN: [id number] Arrival date & time: 09/19/24  2116     Patient presents with: Leg Pain (Hx of cellulitis)   Carolyn Henderson is a 89 y.o. female.    Leg Pain    Patient has a history of hypertension hyperlipidemia thyroid  disease diverticulosis, cellulitis.  Patient was recently admitted to the hospital on November 16 for cellulitis.  She was discharged on the 20th.  Patient was improving but in the last couple days she started noticing increasing redness and discomfort in her lower leg again.  She went and saw her primary care doctor yesterday and was told she appeared to be developing cellulitis again.  Patient was started on doxycycline .  She took 1 dose this morning but family has noticed some increasing redness so she came to the ED today.  No fevers.  No shortness of breath.  Prior to Admission medications   Medication Sig Start Date End Date Taking? Authorizing Provider  acetaminophen  (TYLENOL ) 500 MG tablet Take 1,000 mg by mouth daily.    [provider]  albuterol  (VENTOLIN  HFA) 108 (90 Base) MCG/ACT inhaler Inhale 1 puff into the lungs every 6 (six) hours as needed for wheezing or shortness of breath.    [provider]  amLODipine  (NORVASC ) 2.5 MG tablet Take 1 tablet (2.5 mg total) by mouth daily. 10/21/15   Burchette, Wolm ORN, MD  budesonide-formoterol (SYMBICORT) 80-4.5 MCG/ACT inhaler Inhale 2 puffs into the lungs 2 (two) times daily.    [provider]  Calcium  Carbonate-Vit D-Min (CALCIUM  1200 PO) Take 1 tablet by mouth in the morning and at bedtime.    [provider]  Carboxymethylcell-Hypromellose (GENTEAL OP) Apply 1 drop to eye at bedtime.    [provider]  Cholecalciferol  (VITAMIN D ) 50 MCG (2000 UT) CAPS Take 2,000 Units by mouth daily.    [provider]  clotrimazole -betamethasone  (LOTRISONE ) cream Apply 1 application topically 2 (two)  times daily. Patient taking differently: Apply 1 application  topically 2 (two) times daily as needed (itching, eczema). 10/27/15   Burchette, Wolm ORN, MD  CRANBERRY PO Take 2 capsules by mouth daily.    [provider]  cyanocobalamin  (VITAMIN B12) 1000 MCG/ML injection Inject 1,000 mcg into the muscle every 30 (thirty) days. 08/27/24   [provider]  dicyclomine  (BENTYL ) 10 MG capsule Take 10 mg by mouth 4 (four) times daily as needed for spasms. 06/13/24   [provider]  diphenhydrAMINE  (BENADRYL ) 25 MG tablet Take 25 mg by mouth daily as needed for itching or allergies.    [provider]  diphenoxylate -atropine  (LOMOTIL ) 2.5-0.025 MG tablet Take 1 tablet by mouth daily as needed for diarrhea or loose stools. 06/12/24   [provider]  ferrous sulfate  325 (65 FE) MG EC tablet Take 325 mg by mouth daily.    [provider]  fexofenadine (ALLEGRA) 180 MG tablet Take 180 mg by mouth every evening. 08/25/24   [provider]  fluocinonide (LIDEX) 0.05 % external solution Apply 1 Application topically daily as needed (bumps on scalp).    [provider]  gabapentin  (NEURONTIN ) 300 MG capsule TAKE 1 TO 3 CAPSULES BY MOUTH EVERY DAY AS DIRECTED Patient taking differently: Take 300 mg by mouth 2 (two) times daily. 08/18/15   Krystal Reyes LABOR, MD  levothyroxine  (SYNTHROID ) 112 MCG tablet Take 112 mcg by mouth daily before breakfast.    [provider]  Multiple Vitamins-Minerals (PRESERVISION AREDS 2) CAPS Take 1 capsule by mouth 2 (two) times daily.     [provider]  nystatin (MYCOSTATIN/NYSTOP) powder Apply 1 Application topically daily as needed (rash/itching). 08/18/24   [provider]  Polyethyl Glycol-Propyl Glycol (SYSTANE OP) Apply 1 drop to eye in the morning and at bedtime.    [provider]  Probiotic Product (PROBIOTIC PO) Take 1 capsule by mouth every evening.    [provider]  rOPINIRole  (REQUIP ) 2 MG tablet Take 2 mg by mouth at bedtime.    [provider]    Allergies: Cephalosporins, Codeine sulfate, Hydrocodone , Percocet [oxycodone -acetaminophen ], Tramadol , and Penicillins    Review of Systems  Updated Vital Signs BP (!) 170/68 (BP Location: Right Arm)   Pulse 69   Temp 97.9 F (36.6 C) (Oral)   SpO2 100%   Physical Exam Vitals and nursing note reviewed.  Constitutional:      Appearance: She is well-developed. She is not diaphoretic.  HENT:     Head: Normocephalic and atraumatic.     Right Ear: External ear normal.     Left Ear: External ear normal.  Eyes:     General: No scleral icterus.       Right eye: No discharge.        Left eye: No discharge.     Conjunctiva/sclera: Conjunctivae normal.  Neck:     Trachea: No tracheal deviation.  Cardiovascular:     Rate and Rhythm: Normal rate and regular rhythm.  Pulmonary:     Effort: Pulmonary effort is normal. No respiratory distress.     Breath sounds: Normal breath sounds. No stridor. No wheezing or rales.  Abdominal:     General: Bowel sounds are normal. There is no distension.     Palpations: Abdomen is soft.     Tenderness: There is no abdominal tenderness. There is no guarding or rebound.  Musculoskeletal:        General: No tenderness or deformity.     Cervical back: Neck supple.     Right lower leg: Edema present.     Comments: Erythema right lower extremity up to the knee, ttp  Skin:    General: Skin is warm and dry.     Findings: No rash.  Neurological:     General: No focal deficit present.     Mental Status: She is alert.     Cranial Nerves: No cranial nerve deficit, dysarthria or facial asymmetry.     Sensory: No sensory deficit.     Motor: No abnormal muscle tone or seizure activity.     Coordination: Coordination normal.  Psychiatric:        Mood and Affect: Mood normal.     (all labs ordered are listed, but only abnormal results are  displayed) Labs Reviewed  CBC - Abnormal; Notable for the following components:      Result Value   Hemoglobin 11.4 (*)    All other components within normal limits  BASIC METABOLIC PANEL WITH GFR - Abnormal; Notable for the following components:   Sodium 132 (*)    Chloride 97 (*)    All other components within normal limits  D-DIMER, QUANTITATIVE - Abnormal; Notable for the following components:   D-Dimer, Quant 2.62 (*)    All other components within normal limits  I-STAT CG4 LACTIC ACID, ED    EKG: None  Radiology: No results found.   Procedures   Medications Ordered in the ED  dalbavancin (DALVANCE ) 1,500 mg in dextrose  5 % 500 mL IVPB (has no administration in time range)  lactated ringers  bolus 500 mL (500 mLs Intravenous New Bag/Given 09/19/24 2238)    Clinical Course as of 09/19/24 2316  Sat Sep 19, 2024  2240 CBC(!) CBC normal.  Lactic acid level normal [JK]    Clinical Course User Index [JK] Randol Simmonds, MD                                 Medical Decision Making Amount and/or Complexity of Data Reviewed Labs: ordered. Decision-making details documented in ED Course.   Patient's exam is suggestive of cellulitis.  Did consider the possibility of DVT.  She was recently in the hospital for cellulitis treatment.  No leukocytosis noted.  Lactic acid level not elevated.  Patient afebrile with otherwise normal vitals.  Will give a dose of Dalvance .  Metabolic panel and D-dimer are currently pending     Final diagnoses:  Cellulitis of right lower extremity  Positive D dimer    ED Discharge Orders          Ordered    Ambulatory referral to Infectious Disease       Comments: Cellulitis patient:  Received dalbavancin on 09/19/2024.   09/19/24 2302    LE Venous       Comments: IMPORTANT PATIENT INSTRUCTIONS: You have been scheduled for an Outpatient Vascular Study at [facility].  If tomorrow is a Saturday, Sunday or holiday, please go to the [facility]  Emergency Department Registration Desk at 11 am tomorrow morning and tell them you are there for a vascular study.  If tomorrow is a weekday (Monday-Friday), please go to the [facility] (address [address] [place]) at 8 am and report to the 4th floor registration Zone A.  Inform registration that you are there for a vascular study.   09/19/24 2315               Randol Simmonds, MD 09/19/24 2316

## 2024-09-19 NOTE — Progress Notes (Addendum)
 Pharmacy Note: dalbavancin (DALVANCE ) for Acute Bacterial Skin and Skin Structure Infection (ABSSSI) Patients to [facility] Discharge   Carolyn Henderson is a 89 y.o. female who presented to [facility] on 09/19/2024 with an ABSSSI.  Inclusion criteria:  Indication: worsening LLE Cellulitis.  Patient has at least one SIRS criteria present: none   Exclusion criteria: Patient was evaluated and negative for hardware involvement, hypotension / shock, elevated lactate > 2 without other explanation, gram-negative infection risk factors (bites, water exposure, infection after trauma, infection after skin graft, burns, severe immunocompromise), necrotizing fasciitis possible / confirmed, known or suspected osteomyelitis or septic arthritis, endocarditis, diabetic foot infection, ischemic ulcers, post-operative wound infection, perirectal infection, need for drainage in the OR, hand / facial infections, injection drug users with  fever, bacteremia, pregnancy or breastfeeding, allergy related to antibiotics like vancomycin , known liver disease ( T.Bili > 2x ULN or AST/ALT > 3x ULN).  Additionally, confirmed with MD she will need outpatient work-up to r/o DVT, but still wants dose of Dalvance  for cellulitis.   Rosaline Millet 09/19/2024, 10:49 PM Clinical Pharmacist

## 2024-09-19 NOTE — ED Triage Notes (Signed)
 BIB EMS from [place] d/t cellulitis rt leg-wound due to cut on leg from furniture which became infected. Presently on antibiotic (doxycycline ) 2x daily. Swelling has increased to knee. Presents with pain, swelling and hot to touch.   HR: 80 RR: 18 BP: 196/84 CBG: 116 TEMP: 98.2

## 2024-09-20 ENCOUNTER — Ambulatory Visit (HOSPITAL_COMMUNITY)
Admission: RE | Admit: 2024-09-20 | Discharge: 2024-09-20 | Disposition: A | Source: Ambulatory Visit | Attending: Emergency Medicine | Admitting: Emergency Medicine
